# Patient Record
Sex: Female | Born: 1940
Health system: Southern US, Community
[De-identification: ages and names within clinical notes are randomized; demographics above are authoritative.]

## PROBLEM LIST (undated history)

## (undated) DIAGNOSIS — N8110 Cystocele, unspecified: Secondary | ICD-10-CM

## (undated) DIAGNOSIS — K219 Gastro-esophageal reflux disease without esophagitis: Secondary | ICD-10-CM

## (undated) DIAGNOSIS — M1990 Unspecified osteoarthritis, unspecified site: Secondary | ICD-10-CM

## (undated) DIAGNOSIS — E042 Nontoxic multinodular goiter: Secondary | ICD-10-CM

## (undated) DIAGNOSIS — K623 Rectal prolapse: Secondary | ICD-10-CM

## (undated) DIAGNOSIS — C50919 Malignant neoplasm of unspecified site of unspecified female breast: Secondary | ICD-10-CM

## (undated) DIAGNOSIS — I10 Essential (primary) hypertension: Secondary | ICD-10-CM

## (undated) DIAGNOSIS — E049 Nontoxic goiter, unspecified: Secondary | ICD-10-CM

## (undated) DIAGNOSIS — E785 Hyperlipidemia, unspecified: Secondary | ICD-10-CM

## (undated) HISTORY — DX: Unspecified osteoarthritis, unspecified site: M19.90

## (undated) HISTORY — DX: Cystocele, unspecified: N81.10

## (undated) HISTORY — DX: Nontoxic goiter, unspecified: E04.9

## (undated) HISTORY — DX: Essential (primary) hypertension: I10

## (undated) HISTORY — DX: Nontoxic multinodular goiter: E04.2

## (undated) HISTORY — PX: CHOLECYSTECTOMY: SHX55

## (undated) HISTORY — PX: ABDOMINAL HYSTERECTOMY: SHX81

## (undated) HISTORY — PX: COLONOSCOPY: SHX5424

## (undated) HISTORY — DX: Rectal prolapse: K62.3

## (undated) HISTORY — DX: Malignant neoplasm of unspecified site of unspecified female breast: C50.919

## (undated) HISTORY — DX: Hyperlipidemia, unspecified: E78.5

---

## 2008-04-23 HISTORY — PX: BREAST LUMPECTOMY: SHX2

## 2009-11-01 ENCOUNTER — Encounter: Admission: RE | Admit: 2009-11-01 | Discharge: 2009-11-01 | Payer: Self-pay | Admitting: Radiology

## 2009-11-16 ENCOUNTER — Ambulatory Visit (HOSPITAL_COMMUNITY): Admission: RE | Admit: 2009-11-16 | Discharge: 2009-11-16 | Payer: Self-pay | Admitting: General Surgery

## 2009-12-13 ENCOUNTER — Ambulatory Visit: Admission: RE | Admit: 2009-12-13 | Discharge: 2010-01-24 | Payer: Self-pay | Admitting: Radiation Oncology

## 2010-07-05 ENCOUNTER — Encounter: Payer: Self-pay | Admitting: Cardiology

## 2010-07-08 LAB — CBC
HCT: 44.6 % (ref 36.0–46.0)
MCH: 31.3 pg (ref 26.0–34.0)
MCHC: 33.7 g/dL (ref 30.0–36.0)
MCV: 92.7 fL (ref 78.0–100.0)

## 2010-07-08 LAB — DIFFERENTIAL
Basophils Absolute: 0 10*3/uL (ref 0.0–0.1)
Basophils Relative: 1 % (ref 0–1)
Eosinophils Absolute: 0.3 10*3/uL (ref 0.0–0.7)
Eosinophils Relative: 4 % (ref 0–5)
Lymphocytes Relative: 25 % (ref 12–46)
Lymphs Abs: 2.1 10*3/uL (ref 0.7–4.0)
Monocytes Relative: 10 % (ref 3–12)

## 2010-07-08 LAB — COMPREHENSIVE METABOLIC PANEL
Alkaline Phosphatase: 86 U/L (ref 39–117)
BUN: 15 mg/dL (ref 6–23)
Calcium: 9.4 mg/dL (ref 8.4–10.5)
Chloride: 103 mEq/L (ref 96–112)
Creatinine, Ser: 0.72 mg/dL (ref 0.4–1.2)
GFR calc Af Amer: >60 mL/min (ref >60)
GFR calc non Af Amer: >60 mL/min (ref >60)
Potassium: 3.8 mEq/L (ref 3.5–5.1)
Sodium: 138 mEq/L (ref 135–145)

## 2010-07-08 LAB — URINALYSIS, ROUTINE W REFLEX MICROSCOPIC
Ketones, ur: NEGATIVE mg/dL
Nitrite: NEGATIVE
Specific Gravity, Urine: 1.019 (ref 1.005–1.030)
Urobilinogen, UA: 0.2 mg/dL (ref 0.0–1.0)
pH: 6.5 (ref 5.0–8.0)

## 2010-07-08 LAB — SURGICAL PCR SCREEN
MRSA, PCR: NEGATIVE
Staphylococcus aureus: NEGATIVE

## 2010-07-20 NOTE — Letter (Signed)
Summary: External Correspondence/  [facility] MEDICINE  External Correspondence/  [facility] MEDICINE   Imported By: Dorise Hiss 07/10/2010 16:33:19  _____________________________________________________________________  External Attachment:    Type:   Image     Comment:   External Document

## 2010-08-18 ENCOUNTER — Encounter: Payer: Self-pay | Admitting: Cardiology

## 2010-08-21 ENCOUNTER — Encounter: Payer: Self-pay | Admitting: *Deleted

## 2010-08-21 ENCOUNTER — Encounter: Payer: Self-pay | Admitting: Cardiology

## 2010-08-21 ENCOUNTER — Ambulatory Visit (INDEPENDENT_AMBULATORY_CARE_PROVIDER_SITE_OTHER): Payer: Medicare Other | Admitting: Cardiology

## 2010-08-21 VITALS — BP 148/86 | HR 66 | Ht 64.0 in | Wt 186.0 lb

## 2010-08-21 DIAGNOSIS — E785 Hyperlipidemia, unspecified: Secondary | ICD-10-CM

## 2010-08-21 DIAGNOSIS — R072 Precordial pain: Secondary | ICD-10-CM | POA: Insufficient documentation

## 2010-08-21 DIAGNOSIS — I10 Essential (primary) hypertension: Secondary | ICD-10-CM | POA: Insufficient documentation

## 2010-08-21 NOTE — Assessment & Plan Note (Signed)
The blood pressure is at target. No change in medications is indicated. We will continue with therapeutic lifestyle changes (TLC).  

## 2010-08-21 NOTE — Assessment & Plan Note (Signed)
Her chest discomfort is somewhat atypical. However, she has significant cardiovascular risk factors. Exercise treadmill testing as indicated. We discussed at length risk reduction.

## 2010-08-21 NOTE — Progress Notes (Signed)
Belva Agee, NP  HPI Patient presents for evaluation of chest discomfort. She has had no prior cardiac history other than a stress test many years ago. However, she has a family history of early coronary disease. She noted about 6 weeks ago chest discomfort. This happened was movements were then progressed to be some heaviness when she went walking. She walks every day. She noticed some decreased exercise tolerance. She had some mild discomfort with nausea. She did have an EKG which I reviewed which showed no significant abnormalities. She said that actually over the past couple of weeks her symptoms have abated. She has had no jaw discomfort. She has had no shortness of breath, PND or orthopnea. She has had no palpitations, presyncope or syncope.  No Known Allergies  Current Outpatient Prescriptions  Medication Sig Dispense Refill  . anastrozole (ARIMIDEX) 1 MG tablet Take 1 mg by mouth daily.        [name] aspirin 81 MG tablet Take 81 mg by mouth daily.        . Calcium Carbonate-Vitamin D (CALCIUM 600+D) 600-200 MG-UNIT TABS Take 1 tablet by mouth daily.        [name] diltiazem (CARDIZEM CD) 300 MG 24 hr capsule Take 300 mg by mouth daily.        . Multiple Vitamin (MULTIVITAMIN) tablet Take 1 tablet by mouth daily.        [name] olmesartan-hydrochlorothiazide (BENICAR HCT) 40-12.5 MG per tablet Take 1 tablet by mouth daily.        . simvastatin (ZOCOR) 20 MG tablet Take 10 mg by mouth at bedtime.          Past Medical History  Diagnosis Date  . Unspecified essential hypertension   . Precordial pain   . Other and unspecified hyperlipidemia   . Malignant neoplasm of breast (female), unspecified site     Past Surgical History  Procedure Date  . Breast lumpectomy   . Cholecystectomy     Family History  Problem Relation Age of Onset  . Coronary artery disease Mother      CABG age 58  . Alcohol abuse Father     History   Social History  . Marital Status: Married    Spouse Name: N/A   Number of Children: 2  . Years of Education: N/A   Occupational History  . Retired     Futures trader   Social History Main Topics  . Smoking status: Never Smoker   . Smokeless tobacco: Not on file  . Alcohol Use: Not on file  . Drug Use: Not on file  . Sexually Active: Not on file   Other Topics Concern  . Not on file   Social History Narrative  . No narrative on file    ROS:  Positive for mild bladder and rectal prolapse, soft fleshy loose, sciatic nerve problems. Otherwise as stated in the history of present illness negative for all other systems.  PHYSICAL EXAM BP 148/86  Pulse 66  Ht 5\' 4"  (1.626 m)  Wt 186 lb (84.369 kg)  BMI 31.93 kg/m2 GENERAL:  Well appearing HEENT:  Pupils equal round and reactive, fundi not visualized, oral mucosa unremarkable NECK:  No jugular venous distention, waveform within normal limits, carotid upstroke brisk and symmetric, no bruits, no thyromegaly LYMPHATICS:  No cervical, inguinal adenopathy LUNGS:  Clear to auscultation bilaterally BACK:  No CVA tenderness CHEST:  Unremarkable HEART:  PMI not displaced or sustained,S1 and S2 within normal limits, no S3, no  S4, no clicks, no rubs, no murmurs ABD:  Flat, positive bowel sounds normal in frequency in pitch, no bruits, no rebound, no guarding, no midline pulsatile mass, no hepatomegaly, no splenomegaly EXT:  2 plus pulses throughout, no edema, no cyanosis no clubbing SKIN:  No rashes no nodules NEURO:  Cranial nerves II through XII grossly intact, motor grossly intact throughout PSYCH:  Cognitively intact, oriented to person place and time   EKG:  Reviewed primary M.D. Office, April 14, normal sinus rhythm, axis within normal limits, no acute ST-T wave changes  ASSESSMENT AND PLAN

## 2010-08-21 NOTE — Assessment & Plan Note (Signed)
She reports that this is well controlled.  I will defer to her primary provider.

## 2010-08-21 NOTE — Patient Instructions (Signed)
Your physician has requested that you have an exercise tolerance test. For further information please visit [email or web address] Please also follow instruction sheet, as given. Your physician recommends that you continue on your current medications as directed. Please refer to the Current Medication list given to you today. Follow up will be based on test results. If the results of your test are normal or stable, you will receive a letter. If they are abnormal, the nurse will contact you by phone.

## 2010-08-23 DIAGNOSIS — R072 Precordial pain: Secondary | ICD-10-CM

## 2010-08-29 ENCOUNTER — Telehealth: Payer: Self-pay | Admitting: *Deleted

## 2010-08-29 NOTE — Telephone Encounter (Signed)
Pt notified of results and verbalized understanding.  Report faxed to [facility].

## 2010-08-29 NOTE — Telephone Encounter (Signed)
Message copied by Cyril Loosen on Tue Aug 29, 2010  1:29 PM ------      Message from: Rollene Rotunda      Created: Sat Aug 26, 2010  7:07 PM       No ischemia.  However, she needs to continue to have her blood pressure followed by [facility].  Fax results to that office.

## 2010-09-22 ENCOUNTER — Encounter (INDEPENDENT_AMBULATORY_CARE_PROVIDER_SITE_OTHER): Payer: Self-pay | Admitting: General Surgery

## 2010-09-22 DIAGNOSIS — Z780 Asymptomatic menopausal state: Secondary | ICD-10-CM | POA: Insufficient documentation

## 2010-09-22 DIAGNOSIS — Z79890 Hormone replacement therapy (postmenopausal): Secondary | ICD-10-CM

## 2010-09-22 DIAGNOSIS — M1990 Unspecified osteoarthritis, unspecified site: Secondary | ICD-10-CM | POA: Insufficient documentation

## 2010-09-22 HISTORY — DX: Asymptomatic menopausal state: Z78.0

## 2010-11-27 ENCOUNTER — Ambulatory Visit (INDEPENDENT_AMBULATORY_CARE_PROVIDER_SITE_OTHER): Payer: Self-pay | Admitting: General Surgery

## 2010-12-04 ENCOUNTER — Encounter (INDEPENDENT_AMBULATORY_CARE_PROVIDER_SITE_OTHER): Payer: Self-pay

## 2010-12-04 ENCOUNTER — Encounter (INDEPENDENT_AMBULATORY_CARE_PROVIDER_SITE_OTHER): Payer: Self-pay | Admitting: General Surgery

## 2010-12-04 ENCOUNTER — Ambulatory Visit (INDEPENDENT_AMBULATORY_CARE_PROVIDER_SITE_OTHER): Payer: Medicare Other | Admitting: General Surgery

## 2010-12-04 VITALS — BP 118/78 | HR 64 | Temp 97.6°F | Wt 186.2 lb

## 2010-12-04 DIAGNOSIS — Z853 Personal history of malignant neoplasm of breast: Secondary | ICD-10-CM

## 2010-12-04 NOTE — Patient Instructions (Signed)
Your physical exam today is normal. All of your wounds have healed well. There is no sign of any recurrent cancer. Your mammograms which were performed on Sep 20, 2010 look good. There is a lot of scarring where the lumpectomy and radiation therapy was. I agree that you should get a right breast mammogram in 6 months and bilateral mammograms in one year. I will see you back in one year, sooner if there are any new problems.

## 2010-12-04 NOTE — Progress Notes (Signed)
Ashley Holland is a 70 y.o. female.    Chief Complaint  Patient presents with  . Breast Cancer Long Term Follow Up    HPI HPI  Patient returns for long-term followup for her breast cancer.  She underwent right partial mastectomy with sentinel lymph biopsy and reexcision of superior margin on November 16, 2009. She had an invasive cancer stage TI C., N0, receptor positive, Ki-67 18%, HER-2-negative. The tumor was 1.3 cm and she had good negative margins.  She completed radiation therapy on January 19, 2010. She is taking arimidex and is following regularly with Dr. Earma Reading.  She feels fine. She has no complaints. She has minimal hot flashes from the anti-estrogen therapy. She says her breasts feel fine.  She had bilateral mammograms on Sep 20, 2010. No significant abnormalities identified but minor postlumpectomy scarring changes were seen on the right right as well some skin thickening and radiation therapy. They plan a 6 month interval followup mammogram of the right breast in November.  Past Medical History  Diagnosis Date  . Unspecified essential hypertension   . Precordial pain   . Other and unspecified hyperlipidemia   . Malignant neoplasm of breast (female), unspecified site   . Arthritis     Per medical history form dated 11/04/09.  [name] Rectal prolapse   . Female bladder prolapse     Past Surgical History  Procedure Date  . Breast lumpectomy     Right  . Cholecystectomy   . Abdominal hysterectomy     Family History  Problem Relation Age of Onset  . Coronary artery disease Mother      CABG age 82  . Hyperlipidemia Mother   . Stroke Mother   . Alcohol abuse Father   . Hypertension Father   . Hypertension Sister     Social History History  Substance Use Topics  . Smoking status: Never Smoker   . Smokeless tobacco: Not on file  . Alcohol Use: No    No Known Allergies  Current Outpatient Prescriptions  Medication Sig Dispense Refill  . anastrozole  (ARIMIDEX) 1 MG tablet Take 1 mg by mouth daily.        [name] aspirin 81 MG tablet Take 81 mg by mouth daily.        . Calcium Carbonate-Vitamin D (CALCIUM 600+D) 600-200 MG-UNIT TABS Take 1 tablet by mouth daily.        [name] diltiazem (CARDIZEM CD) 300 MG 24 hr capsule Take 300 mg by mouth daily.        . Multiple Vitamin (MULTIVITAMIN) tablet Take 1 tablet by mouth daily.        [name] olmesartan-hydrochlorothiazide (BENICAR HCT) 40-12.5 MG per tablet Take 1 tablet by mouth daily.        . simvastatin (ZOCOR) 20 MG tablet Take 10 mg by mouth at bedtime.        [name] estradiol (ESTRACE) 1 MG tablet Take 1 mg by mouth daily. Per medical history form dated 11/04/09.       . naproxen sodium (ANAPROX) 220 MG tablet Take 220 mg by mouth 1 dose over 24 hours. Taken in pm. per medical history form dated 11/04/09.         Review of Systems ROS 10 system review of systems is performed and is negative except as described above. Physical Exam Physical Exam  Patient looks well, is in good spirits, and is in no distress.  Neck- no adenopathy, no mass, no jugular venous distention.  Lungs- are clear to auscultation. No chest wall tenderness.  Breasts- left breast is soft no palpable mass no skin changes no axillary adenopathy. Right breast reveals a well-healed scar laterally and well-healed axillary scar. No palpable mass no other skin changes no maxillary adenopathy. Extremities- right arm feels normal. There is no sensory deficit.here is no swelling. Blood pressure 118/78, pulse 64, temperature 97.6 F (36.4 C), temperature source Temporal, weight 186 lb 3.2 oz (84.46 kg).  Assessment/Plan Invasive mammary carcinoma of the right breast, upper outer quadrant, pathologic stage TI C., N0, receptor positive, Ki-67 18%.HER-2 negative.  No evidence of recurrence one year postop.  I agree with the right breast mammogram in 6 months, and bilateral mammograms in one year.  Continue close followup as Dr. Earma Reading.  Return to see me in one year. Reeve Mallo M 12/04/2010, 12:30 PM

## 2010-12-23 HISTORY — PX: UPPER GASTROINTESTINAL ENDOSCOPY: SHX188

## 2011-01-19 ENCOUNTER — Other Ambulatory Visit: Payer: Self-pay | Admitting: Gastroenterology

## 2011-03-20 ENCOUNTER — Encounter: Payer: Self-pay | Admitting: Radiation Oncology

## 2011-03-23 ENCOUNTER — Other Ambulatory Visit (HOSPITAL_COMMUNITY): Payer: Self-pay | Admitting: Gastroenterology

## 2011-03-23 ENCOUNTER — Other Ambulatory Visit: Payer: Self-pay | Admitting: Gastroenterology

## 2011-03-23 DIAGNOSIS — R112 Nausea with vomiting, unspecified: Secondary | ICD-10-CM

## 2011-03-23 DIAGNOSIS — R634 Abnormal weight loss: Secondary | ICD-10-CM

## 2011-03-29 ENCOUNTER — Ambulatory Visit
Admission: RE | Admit: 2011-03-29 | Discharge: 2011-03-29 | Disposition: A | Discharge status: Home / Self Care | Payer: Medicare Other | Source: Ambulatory Visit | Attending: Gastroenterology | Admitting: Gastroenterology

## 2011-03-29 DIAGNOSIS — R634 Abnormal weight loss: Secondary | ICD-10-CM

## 2011-03-29 MED ORDER — IOHEXOL 300 MG/ML  SOLN
100.0000 mL | Freq: Once | INTRAMUSCULAR | Status: AC | PRN
  Administered 2011-03-29: 100 mL via INTRAVENOUS

## 2011-04-02 ENCOUNTER — Encounter (HOSPITAL_COMMUNITY)
Admission: RE | Admit: 2011-04-02 | Discharge: 2011-04-02 | Disposition: A | Discharge status: Home / Self Care | Payer: Medicare Other | Source: Ambulatory Visit | Attending: Gastroenterology | Admitting: Gastroenterology

## 2011-04-02 DIAGNOSIS — R112 Nausea with vomiting, unspecified: Secondary | ICD-10-CM | POA: Insufficient documentation

## 2011-04-02 MED ORDER — TECHNETIUM TC 99M SULFUR COLLOID
2.0000 | Freq: Once | INTRAVENOUS | Status: AC | PRN
  Administered 2011-04-02: 2 via INTRAVENOUS

## 2011-04-10 ENCOUNTER — Encounter (HOSPITAL_COMMUNITY): Payer: Self-pay | Admitting: *Deleted

## 2011-04-25 ENCOUNTER — Encounter (HOSPITAL_COMMUNITY)
Admission: RE | Disposition: A | Discharge status: Home / Self Care | Payer: Self-pay | Source: Ambulatory Visit | Attending: Gastroenterology

## 2011-04-25 ENCOUNTER — Encounter (HOSPITAL_COMMUNITY): Payer: Self-pay | Admitting: *Deleted

## 2011-04-25 ENCOUNTER — Ambulatory Visit (HOSPITAL_COMMUNITY): Payer: Medicare Other | Admitting: Anesthesiology

## 2011-04-25 ENCOUNTER — Ambulatory Visit (HOSPITAL_COMMUNITY)
Admission: RE | Admit: 2011-04-25 | Discharge: 2011-04-25 | Disposition: A | Discharge status: Home / Self Care | Payer: Medicare Other | Source: Ambulatory Visit | Attending: Gastroenterology | Admitting: Gastroenterology

## 2011-04-25 ENCOUNTER — Encounter (HOSPITAL_COMMUNITY): Payer: Self-pay | Admitting: Anesthesiology

## 2011-04-25 DIAGNOSIS — Z9089 Acquired absence of other organs: Secondary | ICD-10-CM | POA: Insufficient documentation

## 2011-04-25 DIAGNOSIS — K828 Other specified diseases of gallbladder: Secondary | ICD-10-CM | POA: Insufficient documentation

## 2011-04-25 DIAGNOSIS — E785 Hyperlipidemia, unspecified: Secondary | ICD-10-CM | POA: Insufficient documentation

## 2011-04-25 DIAGNOSIS — Z853 Personal history of malignant neoplasm of breast: Secondary | ICD-10-CM | POA: Insufficient documentation

## 2011-04-25 DIAGNOSIS — I10 Essential (primary) hypertension: Secondary | ICD-10-CM | POA: Insufficient documentation

## 2011-04-25 DIAGNOSIS — R112 Nausea with vomiting, unspecified: Secondary | ICD-10-CM | POA: Insufficient documentation

## 2011-04-25 DIAGNOSIS — K838 Other specified diseases of biliary tract: Secondary | ICD-10-CM | POA: Insufficient documentation

## 2011-04-25 HISTORY — PX: EUS: SHX5427

## 2011-04-25 LAB — BASIC METABOLIC PANEL
CO2: 25 mEq/L (ref 19–32)
Calcium: 9.5 mg/dL (ref 8.4–10.5)
Chloride: 103 mEq/L (ref 96–112)
Creatinine, Ser: 0.65 mg/dL (ref 0.50–1.10)
GFR calc non Af Amer: 87 mL/min — ABNORMAL LOW (ref >90)
Glucose, Bld: 102 mg/dL — ABNORMAL HIGH (ref 70–99)
Sodium: 137 mEq/L (ref 135–145)

## 2011-04-25 SURGERY — UPPER ENDOSCOPIC ULTRASOUND (EUS) RADIAL
Anesthesia: Monitor Anesthesia Care

## 2011-04-25 MED ORDER — KETAMINE HCL 10 MG/ML IJ SOLN
INTRAMUSCULAR | Status: DC | PRN
  Administered 2011-04-25 (×2): 10 mg via INTRAVENOUS

## 2011-04-25 MED ORDER — FENTANYL CITRATE 0.05 MG/ML IJ SOLN
INTRAMUSCULAR | Status: DC | PRN
  Administered 2011-04-25 (×2): 50 ug via INTRAVENOUS

## 2011-04-25 MED ORDER — BUTAMBEN-TETRACAINE-BENZOCAINE 2-2-14 % EX AERO
INHALATION_SPRAY | CUTANEOUS | Status: DC | PRN
  Administered 2011-04-25: 2 via TOPICAL

## 2011-04-25 MED ORDER — MIDAZOLAM HCL 5 MG/5ML IJ SOLN
INTRAMUSCULAR | Status: DC | PRN
  Administered 2011-04-25 (×2): 1 mg via INTRAVENOUS

## 2011-04-25 MED ORDER — PROPOFOL 10 MG/ML IV EMUL
INTRAVENOUS | Status: DC | PRN
  Administered 2011-04-25: 100 ug/kg/min via INTRAVENOUS

## 2011-04-25 MED ORDER — SODIUM CHLORIDE 0.9 % IV SOLN
Freq: Once | INTRAVENOUS | Status: AC
  Administered 2011-04-25: 11:00:00 via INTRAVENOUS
  Administered 2011-04-25: 1000 mL via INTRAVENOUS

## 2011-04-25 NOTE — H&P (Signed)
CC:  N/V  HPI:  71 yo female with recurrent N/V.  Dilated CBD seen on CT scan.  EGD normal.  Recent LFTs normal.  PMHx:  Hyperlipidemia, HTN, breast cancer, DJC, cholecystectomy 1968  MEDS:  Zocor, diltiazem, Benicar, Anastrozole, ASA, Calcium, MVI  All:  NKDA  SocHx:  Rare etoh, retired, no smoking  FHx:  No FHx CRC, polyp, pancreatic cancer  ROS:  As per HPI, otherwise negative.  PE: GEN:  Younger appearing than stated age, NAD HEENT:  Anicteric Lungs:  CTA Heart:  RRR ABD:  Soft NEURO:  Non-focal  CT:  Dilated IH/EH bile duct  A/P  Biliary dilatation, normal LFTs         N/V.  Normal endoscopy.  Plan for EUS +/- FNA  Risks (bleeding, infection, bowel perforation that could require surgery, sedation-related changes in cardiopulmonary systems), benefits (identification and possible treatment of source of symptoms, exclusion of certain causes of symptoms), and alternatives (watchful waiting, radiographic imaging studies, empiric medical treatment) of upper endoscopy (EGD) were explained to patient/family in detail and patient wishes to proceed.

## 2011-04-25 NOTE — Brief Op Note (Signed)
Please see [facility] note dated 04/25/11. 

## 2011-04-25 NOTE — Transfer of Care (Signed)
Immediate Anesthesia Transfer of Care Note  Patient: Ashley Holland  Procedure(s) Performed:  UPPER ENDOSCOPIC ULTRASOUND (EUS) RADIAL  Patient Location: Endoscopy Unit  Anesthesia Type: MAC  Level of Consciousness: awake, sedated and patient cooperative  Airway & Oxygen Therapy: Patient Spontanous Breathing and Patient connected to nasal cannula oxygen  Post-op Assessment: Report given to PACU RN and Post -op Vital signs reviewed and stable  Post vital signs: Reviewed and stable  Complications: No apparent anesthesia complications

## 2011-04-25 NOTE — Anesthesia Postprocedure Evaluation (Signed)
  Anesthesia Post-op Note  Patient: Ashley Holland  Procedure(s) Performed:  UPPER ENDOSCOPIC ULTRASOUND (EUS) RADIAL  Patient Location: PACU  Anesthesia Type: MAC  Level of Consciousness: oriented and sedated  Airway and Oxygen Therapy: Patient Spontanous Breathing  Post-op Pain: none  Post-op Assessment: Post-op Vital signs reviewed, Patient's Cardiovascular Status Stable, Respiratory Function Stable and Patent Airway  Post-op Vital Signs: stable  Complications: No apparent anesthesia complications

## 2011-04-25 NOTE — Anesthesia Preprocedure Evaluation (Signed)
Anesthesia Evaluation  Patient identified by MRN, date of birth, ID band Patient awake    Reviewed: Allergy & Precautions, H&P , NPO status , Patient's Chart, lab work & pertinent test results, reviewed documented beta blocker date and time   Airway Mallampati: I TM Distance: >3 FB Neck ROM: Full    Dental  (+) Teeth Intact   Pulmonary neg pulmonary ROS,  clear to auscultation        Cardiovascular hypertension, Pt. on medications Regular Normal Cardiac Symptoms   Neuro/Psych Negative Neurological ROS  Negative Psych ROS   GI/Hepatic Neg liver ROS, N&V   Endo/Other  Negative Endocrine ROS  Renal/GU negative Renal ROS  Genitourinary negative   Musculoskeletal negative musculoskeletal ROS (+)   Abdominal   Peds negative pediatric ROS (+)  Hematology negative hematology ROS (+)   Anesthesia Other Findings   Reproductive/Obstetrics negative OB ROS                           Anesthesia Physical Anesthesia Plan  ASA: II  Anesthesia Plan: MAC   Post-op Pain Management:    Induction: Intravenous  Airway Management Planned: Mask  Additional Equipment:   Intra-op Plan:   Post-operative Plan:   Informed Consent: I have reviewed the patients History and Physical, chart, labs and discussed the procedure including the risks, benefits and alternatives for the proposed anesthesia with the patient or authorized representative who has indicated his/her understanding and acceptance.     Plan Discussed with: CRNA and Surgeon  Anesthesia Plan Comments:         Anesthesia Quick Evaluation

## 2011-04-25 NOTE — Op Note (Signed)
[facility] [address] [place], [place]  [address]  ENDOSCOPIC ULTRASOUND PROCEDURE REPORT  PATIENT:  Ashley Holland, Ashley Holland  MR#:  [id number] BIRTHDATE:  07-Aug-1940  GENDER:  female  ENDOSCOPIST:  Willis Modena, MD REFERRED BY:  Leodis Sias, M.D. Wandalee Ferdinand, M.D.  PROCEDURE DATE:  04/25/2011 PROCEDURE:  Upper EUS ASA CLASS:  Class II INDICATIONS:  nausea, vomiting, dilated CBD (LFTs normal)  MEDICATIONS:   MAC sedation, administered by CRNA, Cetacaine spray x 2  DESCRIPTION OF PROCEDURE:   After the risks benefits and alternatives of the procedure were  explained, informed consent was obtained. The patient was then placed in the left, lateral, decubitus postion and IV sedation was administered. Throughout the procedure, the patient's blood pressure, pulse and oxygen saturations were monitored continuously.  Under direct visualization, the 110036 endoscope was introduced through the mouth and advanced to the second portion of the duodenum.  Water was used as necessary to provide an acoustic interface.  Upon completion of the imaging, water was removed and the patient was sent to the recovery room in satisfactory  condition.  <<PROCEDUREIMAGES>>  FINDINGS:  Normal-appearing pancreas without mass or chronic pancreatitis. Ampulla normal endoscopically and via EUS.  CBD  10mm, CHD    13mm.  No bile duct wall thickening or choledocholithiasis.  Post-cholecystectomy.  ENDOSCOPIC IMPRESSION:    1.  Dilated CBD and CHD without obstructing lesion seen.  Anatomic variant most likely, perhaps exacerbated post-cholecystectomy.  Choledochocele other less likely consideration. 2.  Normal-appearing pancreas; no mass seen. 3.  Normal-appearing ampulla, both endoscopically and via EUS.  RECOMMENDATIONS:      1.  Watch for potential complications of procedure. 2.  Given patient's age, and today's findings, watchful waiting is likely best course of action. 3.  Will discuss  with Dr. Evette Cristal.  ______________________________ Willis Modena  CC:  n. eSIGNEDWillis Modena at 04/25/2011 11:11 AM  [name], [id number]

## 2011-04-26 ENCOUNTER — Encounter (HOSPITAL_COMMUNITY): Payer: Self-pay | Admitting: Gastroenterology

## 2011-10-03 ENCOUNTER — Encounter (INDEPENDENT_AMBULATORY_CARE_PROVIDER_SITE_OTHER): Payer: Self-pay | Admitting: General Surgery

## 2011-12-10 ENCOUNTER — Encounter (INDEPENDENT_AMBULATORY_CARE_PROVIDER_SITE_OTHER): Payer: Self-pay | Admitting: General Surgery

## 2011-12-10 ENCOUNTER — Ambulatory Visit (INDEPENDENT_AMBULATORY_CARE_PROVIDER_SITE_OTHER): Payer: Medicare Other | Admitting: General Surgery

## 2011-12-10 VITALS — BP 116/62 | HR 60 | Temp 97.5°F | Resp 16 | Ht 65.5 in | Wt 175.8 lb

## 2011-12-10 DIAGNOSIS — C50919 Malignant neoplasm of unspecified site of unspecified female breast: Secondary | ICD-10-CM

## 2011-12-10 DIAGNOSIS — C50911 Malignant neoplasm of unspecified site of right female breast: Secondary | ICD-10-CM | POA: Insufficient documentation

## 2011-12-10 HISTORY — DX: Malignant neoplasm of unspecified site of right female breast: C50.911

## 2011-12-10 NOTE — Patient Instructions (Signed)
Your breast exam today is completely normal. There is no evidence of breast cancer. Your mammograms from May are also normal.  Be sure to keep your appointments with Dr. Ubaldo Glassing in [place].  Mammograms next May.  Return to see Dr. Derrell Lolling in one year

## 2011-12-10 NOTE — Progress Notes (Signed)
Patient ID: Ashley Holland, female   DOB: 02-02-1941, 71 y.o.   MRN: [id number]  Chief Complaint  Patient presents with  . Breast Cancer Long Term Follow Up    HPI Ashley Holland is a 71 y.o. female.  She returns for long-term followup of her right breast cancer.  On 11/16/2009 this patient underwent right partial mastectomy and sentinel node biopsy and reexcision of superior margins. Pathology showed invasive ductal carcinoma, receptor positive, HER-2 negative, proliferation index 18%, 1.8 cm tumor, negative margins, stage T1c., N0. She completed radiation therapy under the supervision of Dr. Lurline Hare.  She states that her breast feels fine. Her arms feel fine. She has no complaints.  Bilateral mammograms performed at [facility] on 09/21/2011 looked fine. Category 2.  She is on arimidex and  tolerating that well. HPI  Past Medical History  Diagnosis Date  . Unspecified essential hypertension   . Precordial pain   . Other and unspecified hyperlipidemia   . Arthritis     Per medical history form dated 11/04/09.  [name] Rectal prolapse   . Female bladder prolapse   . Malignant neoplasm of breast (female), unspecified site     Past Surgical History  Procedure Date  . Abdominal hysterectomy   . Cholecystectomy   . Colonoscopy   . Djd   . Breast lumpectomy 2010    Right  . Upper gastrointestinal endoscopy Sept 2012  . Eus 04/25/2011    Procedure: UPPER ENDOSCOPIC ULTRASOUND (EUS) RADIAL;  Surgeon: Freddy Jaksch, MD;  Location: [facility] ENDOSCOPY;  Service: Endoscopy;  Laterality: N/A;    Family History  Problem Relation Age of Onset  . Coronary artery disease Mother      CABG age 77  . Hyperlipidemia Mother   . Stroke Mother   . Alcohol abuse Father   . Hypertension Father   . Hypertension Sister   . Anesthesia problems Neg Hx   . Hypotension Neg Hx   . Malignant hyperthermia Neg Hx   . Pseudochol deficiency Neg Hx     Social History History  Substance Use Topics  .  Smoking status: Never Smoker   . Smokeless tobacco: Not on file  . Alcohol Use: No    No Known Allergies  Current Outpatient Prescriptions  Medication Sig Dispense Refill  . anastrozole (ARIMIDEX) 1 MG tablet Take 1 mg by mouth daily.        [name] aspirin 81 MG tablet Take 81 mg by mouth daily.        . Calcium Carbonate-Vitamin D (CALCIUM 600+D) 600-200 MG-UNIT TABS Take 1 tablet by mouth daily.        [name] diltiazem (CARDIZEM CD) 300 MG 24 hr capsule Take 300 mg by mouth daily.        . Multiple Vitamin (MULTIVITAMIN) tablet Take 1 tablet by mouth daily.        . naproxen sodium (ANAPROX) 220 MG tablet Take 220 mg by mouth 1 dose over 24 hours. Taken in pm. per medical history form dated 11/04/09.       [name] olmesartan-hydrochlorothiazide (BENICAR HCT) 40-12.5 MG per tablet Take 1 tablet by mouth daily.        . simvastatin (ZOCOR) 20 MG tablet Take 10 mg by mouth at bedtime.          Review of Systems Review of Systems  Constitutional: Negative for fever, chills and unexpected weight change.  HENT: Negative for hearing loss, congestion, sore throat, trouble swallowing and voice change.  Eyes: Negative for visual disturbance.  Respiratory: Negative for cough and wheezing.   Cardiovascular: Negative for chest pain, palpitations and leg swelling.  Gastrointestinal: Negative for nausea, vomiting, abdominal pain, diarrhea, constipation, blood in stool, abdominal distention and anal bleeding.  Genitourinary: Negative for hematuria, vaginal bleeding and difficulty urinating.  Musculoskeletal: Negative for arthralgias.  Skin: Negative for rash and wound.  Neurological: Negative for seizures, syncope and headaches.  Hematological: Negative for adenopathy. Does not bruise/bleed easily.  Psychiatric/Behavioral: Negative for confusion.    Blood pressure 116/62, pulse 60, temperature 97.5 F (36.4 C), temperature source Temporal, resp. rate 16, height 5' 5.5" (1.664 m), weight 175 lb 12.8 oz (79.742  kg).  Physical Exam Physical Exam  Constitutional: She is oriented to person, place, and time. She appears well-developed and well-nourished. No distress.  HENT:  Head: Normocephalic and atraumatic.  Neck: Neck supple. No JVD present. No tracheal deviation present. No thyromegaly present.  Cardiovascular: Normal rate, regular rhythm, normal heart sounds and intact distal pulses.   No murmur heard. Pulmonary/Chest: Effort normal and breath sounds normal. No respiratory distress. She has no wheezes. She has no rales. She exhibits no tenderness.       Transverse scar right breast at 9:00. Right axillary scar. All scars well healed. No palpable mass in either breast. No other skin change either breast. No axillary adenopathy on either side. Nipple and areolar complexes looked normal.  Musculoskeletal: She exhibits no edema and no tenderness.  Lymphadenopathy:    She has no cervical adenopathy.  Neurological: She is alert and oriented to person, place, and time. She exhibits normal muscle tone. Coordination normal.  Skin: Skin is warm. No rash noted. She is not diaphoretic. No erythema. No pallor.  Psychiatric: She has a normal mood and affect. Her behavior is normal. Judgment and thought content normal.    Data Reviewed Recent mammograms  Assessment    Invasive ductal carcinoma right breast, pathologic stage TI C., N0, receptor positive, HER-2 negative, proliferation index 18%.  No evidence of recurrence 2 years following right partial mastectomy and sentinel node biopsy and adjuvant radiation therapy    Plan    Continued adjuvant arimidex and close clinical followup with Dr. Earma Reading in [place].  Mammograms should be repeated in May 2014.  Return to see me in one year.       Angelia Mould. Derrell Lolling, M.D., [facility] General and Minimally invasive Surgery Breast and Colorectal Surgery Office:   [phone] Pager:   [phone]  12/10/2011, 2:26  PM

## 2012-01-04 ENCOUNTER — Encounter: Payer: Medicare Other | Admitting: Internal Medicine

## 2012-01-04 DIAGNOSIS — Z170 Estrogen receptor positive status [ER+]: Secondary | ICD-10-CM

## 2012-01-04 DIAGNOSIS — M899 Disorder of bone, unspecified: Secondary | ICD-10-CM

## 2012-01-04 DIAGNOSIS — M949 Disorder of cartilage, unspecified: Secondary | ICD-10-CM

## 2012-01-04 DIAGNOSIS — M810 Age-related osteoporosis without current pathological fracture: Secondary | ICD-10-CM

## 2012-01-04 DIAGNOSIS — C50919 Malignant neoplasm of unspecified site of unspecified female breast: Secondary | ICD-10-CM

## 2012-06-10 ENCOUNTER — Encounter (INDEPENDENT_AMBULATORY_CARE_PROVIDER_SITE_OTHER): Payer: Medicare Other | Admitting: Obstetrics & Gynecology

## 2012-06-10 DIAGNOSIS — N993 Prolapse of vaginal vault after hysterectomy: Secondary | ICD-10-CM

## 2012-07-08 ENCOUNTER — Encounter: Payer: Medicare Other | Admitting: Internal Medicine

## 2012-07-08 DIAGNOSIS — C50919 Malignant neoplasm of unspecified site of unspecified female breast: Secondary | ICD-10-CM

## 2012-07-15 ENCOUNTER — Encounter: Payer: Self-pay | Admitting: *Deleted

## 2012-09-18 ENCOUNTER — Encounter: Payer: Self-pay | Admitting: *Deleted

## 2012-10-03 ENCOUNTER — Ambulatory Visit: Payer: Self-pay | Admitting: Family Medicine

## 2012-10-09 ENCOUNTER — Encounter: Payer: Self-pay | Admitting: Family Medicine

## 2012-10-09 ENCOUNTER — Ambulatory Visit (INDEPENDENT_AMBULATORY_CARE_PROVIDER_SITE_OTHER): Payer: Medicare Other | Admitting: Family Medicine

## 2012-10-09 VITALS — BP 125/73 | HR 74 | Temp 97.4°F | Wt 160.8 lb

## 2012-10-09 DIAGNOSIS — I10 Essential (primary) hypertension: Secondary | ICD-10-CM

## 2012-10-09 DIAGNOSIS — E785 Hyperlipidemia, unspecified: Secondary | ICD-10-CM

## 2012-10-09 NOTE — Progress Notes (Signed)
Patient ID: Ashley Holland, female   DOB: [birth year], 72 y.o.   MRN: [id number] SUBJECTIVE: CC: Chief Complaint  Patient presents with  . Follow-up    4 month follow up discuss bp meds had some lower readings     HPI: 1. Patient is here for follow up of hypertension: denies Headache;deniesChest Pain;denies weakness;denies Shortness of Breath or Orthopnea;denies Visual changes;denies palpitations;denies cough;denies pedal edema;denies symptoms of TIA or stroke; admits to Compliance with medications.had to reduce her benicar/hct because of her lower BPs with diet and weight loss and exercise. denies Problems with medications otherwise.  2. Patient is here for follow up of hyperlipidemia:  denies Headache;denies Chest Pain;denies weakness;denies Shortness of Breath and orthopnea;denies Visual changes;denies palpitations;denies cough;denies pedal edema;denies symptoms of TIA or stroke;deniesClaudication symptoms. admits to Compliance with medications;  PMH/PSH: reviewed/updated in Epic  SH/FH: reviewed/updated in Epic  Allergies: reviewed/updated in Epic  Medications: reviewed/updated in Epic  Immunizations: reviewed/updated in Epic  ROS: As above in the HPI. All other systems are stable or negative.  OBJECTIVE: APPEARANCE:  Patient in no acute distress.The patient appeared well nourished and normally developed. Acyanotic. Waist: VITAL SIGNS:BP 125/73  Pulse 74  Temp(Src) 97.4 F (36.3 C) (Oral)  Wt 160 lb 12.8 oz (72.938 kg)  BMI 26.34 kg/m2 WF  SKIN: warm and  Dry without overt rashes, tattoos and scars  HEAD and Neck: without JVD, Head and scalp: normal Eyes:No scleral icterus. Fundi normal, eye movements normal. Ears: Auricle normal, canal normal, Tympanic membranes normal, insufflation normal. Nose: normal Throat: normal Neck & thyroid: normal  CHEST & LUNGS: Chest wall: normal Lungs: Clear  CVS: Reveals the PMI to be normally located. Regular  rhythm, First and Second Heart sounds are normal,  absence of murmurs, rubs or gallops. Peripheral vasculature: Radial pulses: normal Dorsal pedis pulses: normal Posterior pulses: normal  ABDOMEN:  Appearance: normal Benign, no organomegaly, no masses, no Abdominal Aortic enlargement. No Guarding , no rebound. No Bruits. Bowel sounds: normal  RECTAL: N/A GU: N/A  EXTREMETIES: nonedematous. Both Femoral and Pedal pulses are normal.  MUSCULOSKELETAL:  Spine: normal Joints: intact  NEUROLOGIC: oriented to time,place and person; nonfocal. Strength is normal Sensory is normal Reflexes are normal Cranial Nerves are normal.  ASSESSMENT: HTN (hypertension) - Plan: BASIC METABOLIC PANEL WITH GFR  HLD (hyperlipidemia) - Plan: Hepatic function panel, NMR Lipoprofile with Lipids  PLAN:  Orders Placed This Encounter  Procedures  . BASIC METABOLIC PANEL WITH GFR    Standing Status: Future     Number of Occurrences:      Standing Expiration Date: 10/09/2013  . Hepatic function panel    Standing Status: Future     Number of Occurrences:      Standing Expiration Date: 10/09/2013  . NMR Lipoprofile with Lipids    Standing Status: Future     Number of Occurrences:      Standing Expiration Date: 10/09/2013   Results for orders placed during the hospital encounter of 04/25/11  BASIC METABOLIC PANEL      Result Value Range   Sodium 137  135 - 145 mEq/L   Potassium 3.7  3.5 - 5.1 mEq/L   Chloride 103  96 - 112 mEq/L   CO2 25  19 - 32 mEq/L   Glucose, Bld 102 (*) 70 - 99 mg/dL   BUN 18  6 - 23 mg/dL   Creatinine, Ser 5.78  0.50 - 1.10 mg/dL   Calcium 9.5  8.4 - 46.9 mg/dL  GFR calc non Af Amer 87 (*) >90 mL/min   GFR calc Af Amer >90  >90 mL/min   Meds ordered this encounter  Medications  . docusate sodium (COLACE) 100 MG capsule    Sig: Take 100 mg by mouth 2 (two) times daily.  . Probiotic Product (TRUBIOTICS) CAPS    Sig: Take 1 capsule by mouth daily.   D/C the  benicar/hctz  Return in about 2 months (around 12/09/2012) for Recheck medical problems.  Sheralyn Pinegar P. [place] Charon, M.D.

## 2012-10-16 ENCOUNTER — Other Ambulatory Visit: Payer: Medicare Other

## 2012-10-16 DIAGNOSIS — I10 Essential (primary) hypertension: Secondary | ICD-10-CM

## 2012-10-16 DIAGNOSIS — E785 Hyperlipidemia, unspecified: Secondary | ICD-10-CM

## 2012-10-16 LAB — HEPATIC FUNCTION PANEL
ALT: 12 U/L (ref 0–35)
AST: 24 U/L (ref 0–37)
Albumin: 3.8 g/dL (ref 3.5–5.2)
Alkaline Phosphatase: 79 U/L (ref 39–117)
Bilirubin, Direct: 0.1 mg/dL (ref 0.0–0.3)
Indirect Bilirubin: 0.5 mg/dL (ref 0.0–0.9)
Total Bilirubin: 0.6 mg/dL (ref 0.3–1.2)
Total Protein: 5.9 g/dL — ABNORMAL LOW (ref 6.0–8.3)

## 2012-10-16 LAB — BASIC METABOLIC PANEL WITH GFR
BUN: 15 mg/dL (ref 6–23)
CO2: 27 mEq/L (ref 19–32)
Calcium: 9.1 mg/dL (ref 8.4–10.5)
Chloride: 105 mEq/L (ref 96–112)
Creat: 0.73 mg/dL (ref 0.50–1.10)
GFR, Est African American: >89 mL/min
GFR, Est Non African American: 83 mL/min
Glucose, Bld: 82 mg/dL (ref 70–99)
Potassium: 4.1 mEq/L (ref 3.5–5.3)
Sodium: 137 mEq/L (ref 135–145)

## 2012-10-16 NOTE — Progress Notes (Unsigned)
Patient came in for labs only.

## 2012-10-17 LAB — NMR LIPOPROFILE WITH LIPIDS
Cholesterol, Total: 132 mg/dL (ref <200)
HDL Particle Number: 28.3 umol/L — ABNORMAL LOW (ref >=30.5)
HDL Size: 9.9 nm (ref >=9.2)
HDL-C: 50 mg/dL (ref >=40)
LDL (calc): 71 mg/dL (ref <100)
LDL Particle Number: 869 nmol/L (ref <1000)
LDL Size: 20.5 nm — ABNORMAL LOW (ref >20.5)
LP-IR Score: 26 (ref <=45)
Large HDL-P: 11.1 umol/L (ref >=4.8)
Large VLDL-P: 1.5 nmol/L (ref <=2.7)
Small LDL Particle Number: 397 nmol/L (ref <=527)
Triglycerides: 54 mg/dL (ref <150)
VLDL Size: 46.8 nm — ABNORMAL HIGH (ref <=46.6)

## 2012-11-06 ENCOUNTER — Ambulatory Visit (INDEPENDENT_AMBULATORY_CARE_PROVIDER_SITE_OTHER): Payer: Medicare Other | Admitting: Nurse Practitioner

## 2012-11-06 ENCOUNTER — Encounter: Payer: Self-pay | Admitting: Nurse Practitioner

## 2012-11-06 VITALS — BP 130/72 | HR 65 | Temp 97.1°F | Ht 65.5 in | Wt 162.0 lb

## 2012-11-06 DIAGNOSIS — J069 Acute upper respiratory infection, unspecified: Secondary | ICD-10-CM

## 2012-11-06 DIAGNOSIS — J029 Acute pharyngitis, unspecified: Secondary | ICD-10-CM

## 2012-11-06 LAB — POCT RAPID STREP A (OFFICE): Rapid Strep A Screen: POSITIVE — AB

## 2012-11-06 MED ORDER — CHLORPHEN-PE-ACETAMINOPHEN 4-10-325 MG PO TABS: 1.0000 | ORAL_TABLET | Freq: Every day | ORAL | Status: DC

## 2012-11-06 MED ORDER — AMOXICILLIN 875 MG PO TABS: 875.0000 mg | ORAL_TABLET | Freq: Two times a day (BID) | ORAL | Status: DC

## 2012-11-06 NOTE — Patient Instructions (Signed)
Strep Throat  Strep throat is an infection of the throat caused by a bacteria named Streptococcus pyogenes. Your caregiver may call the infection streptococcal "tonsillitis" or "pharyngitis" depending on whether there are signs of inflammation in the tonsils or back of the throat. Strep throat is most common in children aged 73 15 years during the cold months of the year, but it can occur in people of any age during any season. This infection is spread from person to person (contagious) through coughing, sneezing, or other close contact.  SYMPTOMS   · Fever or chills.  · Painful, swollen, red tonsils or throat.  · Pain or difficulty when swallowing.  · White or yellow spots on the tonsils or throat.  · Swollen, tender lymph nodes or "glands" of the neck or under the jaw.  · Red rash all over the body (rare).  DIAGNOSIS   Many different infections can cause the same symptoms. A test must be done to confirm the diagnosis so the right treatment can be given. A "rapid strep test" can help your caregiver make the diagnosis in a few minutes. If this test is not available, a light swab of the infected area can be used for a throat culture test. If a throat culture test is done, results are usually available in a day or two.  TREATMENT   Strep throat is treated with antibiotic medicine.  HOME CARE INSTRUCTIONS   · Gargle with 1 tsp of salt in 1 cup of warm water, 3 4 times per day or as needed for comfort.  · Family members who also have a sore throat or fever should be tested for strep throat and treated with antibiotics if they have the strep infection.  · Make sure everyone in your household washes their hands well.  · Do not share food, drinking cups, or personal items that could cause the infection to spread to others.  · You may need to eat a soft food diet until your sore throat gets better.  · Drink enough water and fluids to keep your urine clear or pale yellow. This will help prevent dehydration.  · Get plenty of  rest.  · Stay home from school, daycare, or work until you have been on antibiotics for 24 hours.  · Only take over-the-counter or prescription medicines for pain, discomfort, or fever as directed by your caregiver.  · If antibiotics are prescribed, take them as directed. Finish them even if you start to feel better.  SEEK MEDICAL CARE IF:   · The glands in your neck continue to enlarge.  · You develop a rash, cough, or earache.  · You cough up green, yellow-brown, or bloody sputum.  · You have pain or discomfort not controlled by medicines.  · Your problems seem to be getting worse rather than better.  SEEK IMMEDIATE MEDICAL CARE IF:   · You develop any new symptoms such as vomiting, severe headache, stiff or painful neck, chest pain, shortness of breath, or trouble swallowing.  · You develop severe throat pain, drooling, or changes in your voice.  · You develop swelling of the neck, or the skin on the neck becomes red and tender.  · You have a fever.  · You develop signs of dehydration, such as fatigue, dry mouth, and decreased urination.  · You become increasingly sleepy, or you cannot wake up completely.  Document Released: 04/06/2000 Document Revised: 03/26/2012 Document Reviewed: 06/08/2010  [employer]® Patient Information ©2014 [employer].

## 2012-11-06 NOTE — Progress Notes (Addendum)
  Subjective:    Patient ID: Ashley Holland, female    DOB: Jan 04, 1941, 72 y.o.   MRN: [id number]  HPI   PAtient here today c/o sore throat- headache and fatigue. Started about 2 days ago- was having chills.    Review of Systems  Constitutional: Positive for chills and fatigue. Negative for fever, activity change and appetite change.  HENT: Positive for sore throat, trouble swallowing and voice change (hoarce). Negative for ear pain, congestion, rhinorrhea, sneezing, postnasal drip, sinus pressure and tinnitus.   Respiratory: Negative for cough and shortness of breath.   Cardiovascular: Negative for chest pain, palpitations and leg swelling.  Genitourinary: Negative.   Neurological: Negative.        Objective:   Physical Exam  Constitutional: She appears well-developed and well-nourished.  HENT:  Right Ear: Hearing, tympanic membrane, external ear and ear canal normal.  Left Ear: Hearing, tympanic membrane, external ear and ear canal normal.  Nose: No mucosal edema or rhinorrhea. Right sinus exhibits no maxillary sinus tenderness and no frontal sinus tenderness. Left sinus exhibits no maxillary sinus tenderness and no frontal sinus tenderness.  Mouth/Throat: Posterior oropharyngeal edema (mild) and posterior oropharyngeal erythema (mild) present.  Cardiovascular: Normal rate, regular rhythm and normal heart sounds.   Pulmonary/Chest: Effort normal and breath sounds normal.  Abdominal: Soft. Bowel sounds are normal.  Skin: Skin is warm.    Results for orders placed in visit on 11/06/12  POCT RAPID STREP A (OFFICE)      Result Value Range   Rapid Strep A Screen Positive (*) Negative     BP 130/72  Pulse 65  Temp(Src) 97.1 F (36.2 C) (Oral)  Ht 5' 5.5" (1.664 m)  Wt 162 lb (73.483 kg)  BMI 26.54 kg/m2    Assessment & Plan:  1. Sore throat - POCT rapid strep A  2. Upper respiratory infection *1. Take meds as prescribed 2. Use a cool mist humidifier especially during  the winter months and when heat has  been humid. 3. Use saline nose sprays frequently 4. Saline irrigations of the nose can be very helpful if done frequently.  * 4X daily for 1 week*  * Use of a nettie pot can be helpful with this. Follow directions with this* 5. Drink plenty of fluids 6. Keep thermostat turn down low 7.For any cough or congestion  Use plain Mucinex- regular strength or max strength is fine   * Children- consult with Pharmacist for dosing 8. For fever or aces or pains- take tylenol or ibuprofen appropriate for age and weight.  * for fevers greater than 101 orally you may alternate ibuprofen and tylenol every  3 hours.  Meds ordered this encounter  Medications  . omeprazole (PRILOSEC) 20 MG capsule    Sig: Take 20 mg by mouth daily.  . Chlorphen-PE-Acetaminophen (NOREL AD) 4-10-325 MG TABS    Sig: Take 1 tablet by mouth daily.    Dispense:  12 tablet    Refill:  0    Order Specific Question:  Supervising Provider    Answer:  Ernestina Penna [[id number]]  . amoxicillin (AMOXIL) 875 MG tablet    Sig: Take 1 tablet (875 mg total) by mouth 2 (two) times daily.    Dispense:  20 tablet    Refill:  0    Order Specific Question:  Supervising Provider    Answer:  Ernestina Penna [[id number]]      Mary-Margaret Daphine Deutscher, FNP

## 2012-11-10 ENCOUNTER — Telehealth: Payer: Self-pay | Admitting: Nurse Practitioner

## 2012-11-10 NOTE — Telephone Encounter (Signed)
Pt is going to continue her amox. Her symptoms of the uti are improving so i advised patient to continue amox and if symptoms dont improve to give [place] a call.

## 2012-11-18 ENCOUNTER — Ambulatory Visit (INDEPENDENT_AMBULATORY_CARE_PROVIDER_SITE_OTHER): Payer: Medicare Other | Admitting: Family Medicine

## 2012-11-18 ENCOUNTER — Encounter: Payer: Self-pay | Admitting: Family Medicine

## 2012-11-18 ENCOUNTER — Telehealth: Payer: Self-pay | Admitting: Family Medicine

## 2012-11-18 VITALS — BP 142/70 | HR 88 | Temp 102.2°F | Ht 65.5 in | Wt 162.0 lb

## 2012-11-18 DIAGNOSIS — N390 Urinary tract infection, site not specified: Secondary | ICD-10-CM

## 2012-11-18 DIAGNOSIS — N10 Acute tubulo-interstitial nephritis: Secondary | ICD-10-CM

## 2012-11-18 LAB — POCT URINALYSIS DIPSTICK
Bilirubin, UA: NEGATIVE
Glucose, UA: NEGATIVE
Ketones, UA: NEGATIVE
Leukocytes, UA: NEGATIVE
Nitrite, UA: NEGATIVE
Spec Grav, UA: 1.015
Urobilinogen, UA: NEGATIVE
pH, UA: 7.5

## 2012-11-18 LAB — POCT UA - MICROSCOPIC ONLY
Casts, Ur, LPF, POC: NEGATIVE
Yeast, UA: NEGATIVE

## 2012-11-18 MED ORDER — CIPROFLOXACIN HCL 500 MG PO TABS: 500.0000 mg | ORAL_TABLET | Freq: Two times a day (BID) | ORAL | Status: DC

## 2012-11-18 MED ORDER — CEFTRIAXONE SODIUM 1 G IJ SOLR
1.0000 g | Freq: Once | INTRAMUSCULAR | Status: AC
  Administered 2012-11-18: 1 g via INTRAMUSCULAR

## 2012-11-18 NOTE — Progress Notes (Signed)
Patient ID: Ashley Holland, female   DOB: [birth year], 72 y.o.   MRN: [id number] SUBJECTIVE: CC: Chief Complaint  Patient presents with  . Chills  . Urinary Tract Infection  ] HPI Stared with dysuria  Several days ago. Drank a lot of fluids and cranberry and symptoms got better. Today started to have chills and backache. And dysuria returned. No history of kidney stones.  Doing great with the prilosec with her nausea and burping.   Past Medical History  Diagnosis Date  . Unspecified essential hypertension   . Precordial pain   . Other and unspecified hyperlipidemia   . Arthritis     Per medical history form dated 11/04/09.  [name] Rectal prolapse   . Female bladder prolapse   . Malignant neoplasm of breast (female), unspecified site    Past Surgical History  Procedure Laterality Date  . Abdominal hysterectomy    . Cholecystectomy    . Colonoscopy    . Djd    . Breast lumpectomy  2010    Right  . Upper gastrointestinal endoscopy  Sept 2012  . Eus  04/25/2011    Procedure: UPPER ENDOSCOPIC ULTRASOUND (EUS) RADIAL;  Surgeon: Freddy Jaksch, MD;  Location: [facility];  Service: Endoscopy;  Laterality: N/A;   History   Social History  . Marital Status: Married    Spouse Name: N/A    Number of Children: 2  . Years of Education: N/A   Occupational History  . Retired     Futures trader   Social History Main Topics  . Smoking status: Never Smoker   . Smokeless tobacco: Not on file  . Alcohol Use: No  . Drug Use: No  . Sexually Active: Not Currently   Other Topics Concern  . Not on file   Social History Narrative  . No narrative on file   Family History  Problem Relation Age of Onset  . Coronary artery disease Mother      CABG age 17  . Hyperlipidemia Mother   . Stroke Mother   . Alcohol abuse Father   . Hypertension Father   . Hypertension Sister   . Anesthesia problems Neg Hx   . Hypotension Neg Hx   . Malignant hyperthermia Neg Hx   . Pseudochol  deficiency Neg Hx    Current Outpatient Prescriptions on File Prior to Visit  Medication Sig Dispense Refill  . anastrozole (ARIMIDEX) 1 MG tablet Take 1 mg by mouth daily.        [name] aspirin 81 MG tablet Take 81 mg by mouth daily.        . Calcium Carbonate-Vitamin D (CALCIUM 600+D) 600-200 MG-UNIT TABS Take 1 tablet by mouth daily.        [name] diltiazem (CARDIZEM CD) 300 MG 24 hr capsule Take 300 mg by mouth daily.        [name] docusate sodium (COLACE) 100 MG capsule Take 100 mg by mouth 2 (two) times daily.      . Multiple Vitamin (MULTIVITAMIN) tablet Take 1 tablet by mouth daily.        [name] omeprazole (PRILOSEC) 20 MG capsule Take 20 mg by mouth daily.      . Probiotic Product (TRUBIOTICS) CAPS Take 1 capsule by mouth daily.      . simvastatin (ZOCOR) 20 MG tablet Take 10 mg by mouth at bedtime.         No current facility-administered medications on file prior to visit.   No Known Allergies  There is no immunization history on file for this patient. Prior to Admission medications   Medication Sig Start Date End Date Taking? Authorizing Provider  anastrozole (ARIMIDEX) 1 MG tablet Take 1 mg by mouth daily.     Yes Historical Provider, MD  aspirin 81 MG tablet Take 81 mg by mouth daily.     Yes Historical Provider, MD  Calcium Carbonate-Vitamin D (CALCIUM 600+D) 600-200 MG-UNIT TABS Take 1 tablet by mouth daily.     Yes Historical Provider, MD  diltiazem (CARDIZEM CD) 300 MG 24 hr capsule Take 300 mg by mouth daily.     Yes Historical Provider, MD  docusate sodium (COLACE) 100 MG capsule Take 100 mg by mouth 2 (two) times daily.   Yes Historical Provider, MD  Multiple Vitamin (MULTIVITAMIN) tablet Take 1 tablet by mouth daily.     Yes Historical Provider, MD  omeprazole (PRILOSEC) 20 MG capsule Take 20 mg by mouth daily.   Yes Historical Provider, MD  Probiotic Product (TRUBIOTICS) CAPS Take 1 capsule by mouth daily.   Yes Historical Provider, MD  simvastatin (ZOCOR) 20 MG tablet Take 10 mg by  mouth at bedtime.     Yes Historical Provider, MD  ciprofloxacin (CIPRO) 500 MG tablet Take 1 tablet (500 mg total) by mouth 2 (two) times daily. 11/18/12   Ileana Ladd, MD    ROS: As above in the HPI. All other systems are stable or negative.  OBJECTIVE: APPEARANCE:  Patient in no acute distress.The patient appeared well nourished and normally developed. Acyanotic. Waist: VITAL SIGNS:BP 142/70  Pulse 88  Temp(Src) 102.2 F (39 C) (Oral)  Ht 5' 5.5" (1.664 m)  Wt 162 lb (73.483 kg)  BMI 26.54 kg/m2  WF  SKIN: febrile and  Dry without overt rashes, tattoos and scars  HEAD and Neck: without JVD, Head and scalp: normal Eyes:No scleral icterus. Fundi normal, eye movements normal. Ears: Auricle normal, canal normal, Tympanic membranes normal, insufflation normal. Nose: normal Throat: normal Neck & thyroid: normal  CHEST & LUNGS: Chest wall: normal Lungs: Clear  CVS: Reveals the PMI to be normally located. Regular rhythm, First and Second Heart sounds are normal,  absence of murmurs, rubs or gallops. Peripheral vasculature: Radial pulses: normal Dorsal pedis pulses: normal Posterior pulses: normal  ABDOMEN:  Appearance: soft Benign, no organomegaly, no masses, no Abdominal Aortic enlargement. No Guarding , no rebound. No Bruits. Right CVA mild discomfort and lower back discomfort.  Bowel sounds: normal  RECTAL: N/A GU: N/A  EXTREMETIES: nonedematous. Both Femoral and Pedal pulses are normal.  MUSCULOSKELETAL:  Spine: normal Joints: intact  NEUROLOGIC: oriented to time,place and person; nonfocal. Strength is normal Sensory is normal Reflexes are normal Cranial Nerves are normal.  ASSESSMENT: UTI (urinary tract infection) - Plan: POCT UA - Microscopic Only, POCT urinalysis dipstick, cefTRIAXone (ROCEPHIN) injection 1 g  Acute pyelonephritis - Plan: Urine culture, ciprofloxacin (CIPRO) 500 MG tablet, cefTRIAXone (ROCEPHIN) injection 1  g  PLAN:  Orders Placed This Encounter  Procedures  . Urine culture  . POCT UA - Microscopic Only  . POCT urinalysis dipstick   Meds ordered this encounter  Medications  . ciprofloxacin (CIPRO) 500 MG tablet    Sig: Take 1 tablet (500 mg total) by mouth 2 (two) times daily.    Dispense:  20 tablet    Refill:  0  . cefTRIAXone (ROCEPHIN) injection 1 g    Sig:    Results for orders placed in visit on 11/18/12  POCT  UA - MICROSCOPIC ONLY      Result Value Range   WBC, Ur, HPF, POC occ     RBC, urine, microscopic 3-10     Bacteria, U Microscopic mod     Mucus, UA trace     Epithelial cells, urine per micros few     Crystals, Ur, HPF, POC occ     Casts, Ur, LPF, POC neg     Yeast, UA neg    POCT URINALYSIS DIPSTICK      Result Value Range   Color, UA yellow     Clarity, UA cloudy     Glucose, UA neg     Bilirubin, UA neg     Ketones, UA neg     Spec Grav, UA 1.015     Blood, UA mod     pH, UA 7.5     Protein, UA small     Urobilinogen, UA negative     Nitrite, UA neg     Leukocytes, UA Negative     Return in about 2 days (around 11/20/2012) for recheck UTI.  Fluids tylenol. Call stat if vomiting or any worsening.  Manu Rubey P. [place] Charon, M.D.

## 2012-11-18 NOTE — Telephone Encounter (Signed)
appt given  

## 2012-11-20 ENCOUNTER — Encounter: Payer: Self-pay | Admitting: Family Medicine

## 2012-11-20 ENCOUNTER — Ambulatory Visit (INDEPENDENT_AMBULATORY_CARE_PROVIDER_SITE_OTHER): Payer: Medicare Other | Admitting: Family Medicine

## 2012-11-20 VITALS — BP 139/74 | HR 60 | Temp 97.6°F | Wt 158.8 lb

## 2012-11-20 DIAGNOSIS — N12 Tubulo-interstitial nephritis, not specified as acute or chronic: Secondary | ICD-10-CM

## 2012-11-20 LAB — URINE CULTURE

## 2012-11-20 NOTE — Progress Notes (Signed)
Patient ID: Ashley Holland, female   DOB: [birth year], 72 y.o.   MRN: [id number] SUBJECTIVE: CC: Chief Complaint  Patient presents with  . Follow-up    states doing alot better still on antibiotic  -follow up UTI/ Kidney   HPI: Here for folow up of pyelonephritis. Feels great fever broke with IM rocephin. No problems with cipro. Urine fine now. Past Medical History  Diagnosis Date  . Unspecified essential hypertension   . Precordial pain   . Other and unspecified hyperlipidemia   . Arthritis     Per medical history form dated 11/04/09.  [name] Rectal prolapse   . Female bladder prolapse   . Malignant neoplasm of breast (female), unspecified site    Past Surgical History  Procedure Laterality Date  . Abdominal hysterectomy    . Cholecystectomy    . Colonoscopy    . Djd    . Breast lumpectomy  2010    Right  . Upper gastrointestinal endoscopy  Sept 2012  . Eus  04/25/2011    Procedure: UPPER ENDOSCOPIC ULTRASOUND (EUS) RADIAL;  Surgeon: Freddy Jaksch, MD;  Location: [facility];  Service: Endoscopy;  Laterality: N/A;   Current Outpatient Prescriptions on File Prior to Visit  Medication Sig Dispense Refill  . ciprofloxacin (CIPRO) 500 MG tablet Take 1 tablet (500 mg total) by mouth 2 (two) times daily.  20 tablet  0  . anastrozole (ARIMIDEX) 1 MG tablet Take 1 mg by mouth daily.        [name] aspirin 81 MG tablet Take 81 mg by mouth daily.        . Calcium Carbonate-Vitamin D (CALCIUM 600+D) 600-200 MG-UNIT TABS Take 1 tablet by mouth daily.        [name] diltiazem (CARDIZEM CD) 300 MG 24 hr capsule Take 300 mg by mouth daily.        [name] docusate sodium (COLACE) 100 MG capsule Take 100 mg by mouth 2 (two) times daily.      . Multiple Vitamin (MULTIVITAMIN) tablet Take 1 tablet by mouth daily.        [name] omeprazole (PRILOSEC) 20 MG capsule Take 20 mg by mouth daily.      . Probiotic Product (TRUBIOTICS) CAPS Take 1 capsule by mouth daily.      . simvastatin (ZOCOR) 20 MG tablet Take 10 mg by mouth  at bedtime.         No current facility-administered medications on file prior to visit.   No Known Allergies  There is no immunization history on file for this patient. History   Social History  . Marital Status: Married    Spouse Name: N/A    Number of Children: 2  . Years of Education: N/A   Occupational History  . Retired     Futures trader   Social History Main Topics  . Smoking status: Never Smoker   . Smokeless tobacco: Not on file  . Alcohol Use: No  . Drug Use: No  . Sexually Active: Not Currently   Other Topics Concern  . Not on file   Social History Narrative  . No narrative on file      ROS: As above in the HPI. All other systems are stable or negative.  OBJECTIVE: APPEARANCE:  Patient in no acute distress.The patient appeared well nourished and normally developed. Acyanotic. Waist: VITAL SIGNS:BP 139/74  Pulse 60  Temp(Src) 97.6 F (36.4 C) (Oral)  Wt 158 lb 12.8 oz (72.031 kg)  BMI 26.01  kg/m2 WF looks well today.  SKIN: warm and  Dry without overt rashes, tattoos and scars  HEAD and Neck: without JVD, Head and scalp: normal Eyes:No scleral icterus. Fundi normal, eye movements normal. Ears: Auricle normal, canal normal, Tympanic membranes normal, insufflation normal. Nose: normal Throat: normal Neck & thyroid: normal  CHEST & LUNGS: Chest wall: normal Lungs: Clear  CVS: Reveals the PMI to be normally located. Regular rhythm, First and Second Heart sounds are normal,  absence of murmurs, rubs or gallops. Peripheral vasculature: Radial pulses: normal Dorsal pedis pulses: normal Posterior pulses: normal  ABDOMEN:  Appearance: normal Benign, no organomegaly, no masses, no Abdominal Aortic enlargement. No Guarding , no rebound. No Bruits. Bowel sounds: normal  RECTAL: N/A GU: N/A  EXTREMETIES: nonedematous. Both Femoral and Pedal pulses are normal.  MUSCULOSKELETAL:  Spine: normal Joints: intact  NEUROLOGIC:  oriented to time,place and person; nonfocal. Strength is normal Sensory is normal Reflexes are normal Cranial Nerves are normal.  ASSESSMENT: Pyelonephritis - responded to treatment and resolving - Plan: Urine culture  PLAN:  Results for orders placed in visit on 11/18/12  URINE CULTURE      Result Value Range   Urine Culture, Routine Final report (*)    Result 1 Escherichia coli (*)    ANTIMICROBIAL SUSCEPTIBILITY Comment    POCT UA - MICROSCOPIC ONLY      Result Value Range   WBC, Ur, HPF, POC occ     RBC, urine, microscopic 3-10     Bacteria, U Microscopic mod     Mucus, UA trace     Epithelial cells, urine per micros few     Crystals, Ur, HPF, POC occ     Casts, Ur, LPF, POC neg     Yeast, UA neg    POCT URINALYSIS DIPSTICK      Result Value Range   Color, UA yellow     Clarity, UA cloudy     Glucose, UA neg     Bilirubin, UA neg     Ketones, UA neg     Spec Grav, UA 1.015     Blood, UA mod     pH, UA 7.5     Protein, UA small     Urobilinogen, UA negative     Nitrite, UA neg     Leukocytes, UA Negative      Same medications.  Orders Placed This Encounter  Procedures  . Urine culture    Standing Status: Future     Number of Occurrences:      Standing Expiration Date: 11/20/2013    Return if symptoms worsen or fail to improve.  Azriel Dancy P. [place] Charon, M.D.

## 2012-11-26 ENCOUNTER — Other Ambulatory Visit: Payer: Self-pay

## 2012-12-05 ENCOUNTER — Other Ambulatory Visit (INDEPENDENT_AMBULATORY_CARE_PROVIDER_SITE_OTHER): Payer: Medicare Other

## 2012-12-05 DIAGNOSIS — N12 Tubulo-interstitial nephritis, not specified as acute or chronic: Secondary | ICD-10-CM

## 2012-12-07 LAB — URINE CULTURE

## 2012-12-07 NOTE — Progress Notes (Signed)
Quick Note:  Call patient. Labs normal. Pyelonephritis cleared up. No change in plan. ______

## 2012-12-08 ENCOUNTER — Telehealth: Payer: Self-pay | Admitting: Family Medicine

## 2012-12-08 NOTE — Telephone Encounter (Signed)
Pt called

## 2012-12-09 ENCOUNTER — Encounter (INDEPENDENT_AMBULATORY_CARE_PROVIDER_SITE_OTHER): Payer: Self-pay | Admitting: General Surgery

## 2012-12-09 ENCOUNTER — Ambulatory Visit (INDEPENDENT_AMBULATORY_CARE_PROVIDER_SITE_OTHER): Payer: Medicare Other | Admitting: General Surgery

## 2012-12-09 VITALS — BP 140/78 | HR 64 | Temp 98.2°F | Resp 14 | Ht 65.5 in | Wt 159.2 lb

## 2012-12-09 DIAGNOSIS — C50911 Malignant neoplasm of unspecified site of right female breast: Secondary | ICD-10-CM

## 2012-12-09 DIAGNOSIS — C50919 Malignant neoplasm of unspecified site of unspecified female breast: Secondary | ICD-10-CM

## 2012-12-09 NOTE — Patient Instructions (Signed)
Your physical examination today is normal. There is no evidence of cancer.  As you know, your mammograms in June were also unremarkable. No focal abnormality.  Continue to take arimidex.  It is very important that you  establish with another medical oncologist, either in [place] or  [place]. You need to be checked every 6 months because you are on arimadex.  Repeat bilateral mammograms in May, 2015  Return to see Dr. Derrell Lolling in one year.[name]

## 2012-12-09 NOTE — Progress Notes (Signed)
Patient ID: EUTHA CUDE, female   DOB: [birth year], 72 y.o.   MRN: [id number] History: Ashley Holland is a 72 y.o. female. She returns for long-term followup of her right breast cancer.  On 11/16/2009 this patient underwent right partial mastectomy and sentinel node biopsy and reexcision of superior margins. Pathology showed invasive ductal carcinoma, receptor positive, HER-2 negative, proliferation index 18%, 1.8 cm tumor, negative margins, stage T1c., N0. She completed radiation therapy under the supervision of Dr. Lurline Hare.  She was being followed by Dr. Earma Reading, but has not yet reestablished with another medical oncologist. I stressed to her that that was very important. She states that her breast feels fine. Her arms feel fine. She has no complaints.  Bilateral mammograms performed at [facility] on 06.03.2014 looked fine. Category 2.  She is on arimidex and tolerating that well.  ROS: she feels good. 10 system review of systems is negative except as described above.  Exam:  General: Patient looks good. Very fit for her age. No distress. Neck: No adenopathy or mass Lungs: Clear to auscultation bilaterally Heart: Regular rate and rhythm. No murmur. No ectopy Breasts: Right breast reveals well-healed lumpectomy scar laterally at 9:00 and well-healed axillary scar. No masses in either breast. No other skin changes. No axillary adenopathy  Assessment: Invasive ductal carcinoma right breast, pathologic stage TI C., N0, receptor positive, HER-2 negative, proliferation index 18% No evidence of recurrence 3 years following right partial mastectomy and sentinel load biopsy and adjuvant radiation therapy and ongoing adjuvant Dermadex  Plan: Continue arimadex She will reestablish with another medical oncologist, either in [place]  or in [place] Repeat mammograms in May 2015 Return to see me in one year   Eunice Oldaker M. Derrell Lolling, M.D., [facility] General and  Minimally invasive Surgery Breast and Colorectal Surgery Office:   [phone] Pager:   [phone]

## 2012-12-11 ENCOUNTER — Encounter: Payer: Self-pay | Admitting: Family Medicine

## 2012-12-11 ENCOUNTER — Ambulatory Visit (INDEPENDENT_AMBULATORY_CARE_PROVIDER_SITE_OTHER): Payer: Medicare Other | Admitting: Family Medicine

## 2012-12-11 VITALS — BP 141/76 | HR 61 | Temp 97.8°F | Wt 158.4 lb

## 2012-12-11 DIAGNOSIS — C50919 Malignant neoplasm of unspecified site of unspecified female breast: Secondary | ICD-10-CM

## 2012-12-11 DIAGNOSIS — M1990 Unspecified osteoarthritis, unspecified site: Secondary | ICD-10-CM

## 2012-12-11 DIAGNOSIS — E785 Hyperlipidemia, unspecified: Secondary | ICD-10-CM

## 2012-12-11 DIAGNOSIS — C50911 Malignant neoplasm of unspecified site of right female breast: Secondary | ICD-10-CM

## 2012-12-11 DIAGNOSIS — N951 Menopausal and female climacteric states: Secondary | ICD-10-CM

## 2012-12-11 DIAGNOSIS — R634 Abnormal weight loss: Secondary | ICD-10-CM

## 2012-12-11 DIAGNOSIS — I10 Essential (primary) hypertension: Secondary | ICD-10-CM

## 2012-12-11 DIAGNOSIS — Z780 Asymptomatic menopausal state: Secondary | ICD-10-CM

## 2012-12-11 DIAGNOSIS — M129 Arthropathy, unspecified: Secondary | ICD-10-CM

## 2012-12-11 MED ORDER — DILTIAZEM HCL ER COATED BEADS 300 MG PO CP24: 300.0000 mg | ORAL_CAPSULE | Freq: Every day | ORAL | Status: DC

## 2012-12-11 MED ORDER — SIMVASTATIN 20 MG PO TABS: 10.0000 mg | ORAL_TABLET | Freq: Every day | ORAL | Status: DC

## 2012-12-11 NOTE — Progress Notes (Signed)
Patient ID: Ashley Holland, female   DOB: 11-23-1940, 72 y.o.   MRN: [id number] SUBJECTIVE: CC: Chief Complaint  Patient presents with  . Follow-up    2 month follow up wants thyroid checked  . Medication Refill    wants dilatiazem changed to something cheaper and refill 90 supply on simvastattin     HPI: Here for  Routine follow up. Recent pyelonephritis.  Patient is here for follow up of hyperlipidemia/HTN denies Headache;denies Chest Pain;denies weakness;denies Shortness of Breath and orthopnea;denies Visual changes;denies palpitations;denies cough;denies pedal edema;denies symptoms of TIA or stroke;deniesClaudication symptoms. admits to Compliance with medications; denies Problems with medications. BPs at home: 105/66 to 116/73 Wants to check her thyroid because even though her weight loss was intentional, there is a tendency for thyroid disease and over active thyroid in the family.  Stomach problem resolved Past Medical History  Diagnosis Date  . Unspecified essential hypertension   . Precordial pain   . Other and unspecified hyperlipidemia   . Arthritis     Per medical history form dated 11/04/09.  [name] Rectal prolapse   . Female bladder prolapse   . Malignant neoplasm of breast (female), unspecified site    Past Surgical History  Procedure Laterality Date  . Abdominal hysterectomy    . Cholecystectomy    . Colonoscopy    . Djd    . Breast lumpectomy  2010    Right  . Upper gastrointestinal endoscopy  Sept 2012  . Eus  04/25/2011    Procedure: UPPER ENDOSCOPIC ULTRASOUND (EUS) RADIAL;  Surgeon: Freddy Jaksch, MD;  Location: [facility];  Service: Endoscopy;  Laterality: N/A;   History   Social History  . Marital Status: Married    Spouse Name: N/A    Number of Children: 2  . Years of Education: N/A   Occupational History  . Retired     Futures trader   Social History Main Topics  . Smoking status: Never Smoker   . Smokeless tobacco: Never Used  .  Alcohol Use: No  . Drug Use: No  . Sexual Activity: Not Currently   Other Topics Concern  . Not on file   Social History Narrative  . No narrative on file   Family History  Problem Relation Age of Onset  . Coronary artery disease Mother      CABG age 21  . Hyperlipidemia Mother   . Stroke Mother   . Alcohol abuse Father   . Hypertension Father   . Hypertension Sister   . Anesthesia problems Neg Hx   . Hypotension Neg Hx   . Malignant hyperthermia Neg Hx   . Pseudochol deficiency Neg Hx    Current Outpatient Prescriptions on File Prior to Visit  Medication Sig Dispense Refill  . anastrozole (ARIMIDEX) 1 MG tablet Take 1 mg by mouth daily.        [name] aspirin 81 MG tablet Take 81 mg by mouth daily.        . Calcium Carbonate-Vitamin D (CALCIUM 600+D) 600-200 MG-UNIT TABS Take 1 tablet by mouth daily.        [name] diltiazem (CARDIZEM CD) 300 MG 24 hr capsule Take 300 mg by mouth daily.        [name] docusate sodium (COLACE) 100 MG capsule Take 100 mg by mouth 2 (two) times daily.      . Multiple Vitamin (MULTIVITAMIN) tablet Take 1 tablet by mouth daily.        [name] omeprazole (PRILOSEC) 20  MG capsule Take 20 mg by mouth daily.      . Probiotic Product (TRUBIOTICS) CAPS Take 1 capsule by mouth daily.      . simvastatin (ZOCOR) 20 MG tablet Take 10 mg by mouth at bedtime.         No current facility-administered medications on file prior to visit.   No Known Allergies  There is no immunization history on file for this patient. Prior to Admission medications   Medication Sig Start Date End Date Taking? Authorizing Provider  anastrozole (ARIMIDEX) 1 MG tablet Take 1 mg by mouth daily.     Yes Historical Provider, MD  aspirin 81 MG tablet Take 81 mg by mouth daily.     Yes Historical Provider, MD  Calcium Carbonate-Vitamin D (CALCIUM 600+D) 600-200 MG-UNIT TABS Take 1 tablet by mouth daily.     Yes Historical Provider, MD  diltiazem (CARDIZEM CD) 300 MG 24 hr capsule Take 300 mg by mouth  daily.     Yes Historical Provider, MD  docusate sodium (COLACE) 100 MG capsule Take 100 mg by mouth 2 (two) times daily.   Yes Historical Provider, MD  Multiple Vitamin (MULTIVITAMIN) tablet Take 1 tablet by mouth daily.     Yes Historical Provider, MD  omeprazole (PRILOSEC) 20 MG capsule Take 20 mg by mouth daily.   Yes Historical Provider, MD  Probiotic Product (TRUBIOTICS) CAPS Take 1 capsule by mouth daily.   Yes Historical Provider, MD  simvastatin (ZOCOR) 20 MG tablet Take 10 mg by mouth at bedtime.     Yes Historical Provider, MD    ROS: As above in the HPI. All other systems are stable or negative.  OBJECTIVE: APPEARANCE:  Patient in no acute distress.The patient appeared well nourished and normally developed. Acyanotic. Waist: VITAL SIGNS:BP 141/76  Pulse 61  Temp(Src) 97.8 F (36.6 C) (Oral)  Wt 158 lb 6.4 oz (71.85 kg)  BMI 25.95 kg/m2 WF  SKIN: warm and  Dry without overt rashes, tattoos and scars  HEAD and Neck: without JVD, Head and scalp: normal Eyes:No scleral icterus. Fundi normal, eye movements normal. Ears: Auricle normal, canal normal, Tympanic membranes normal, insufflation normal. Nose: normal Throat: normal Neck & thyroid: normal  CHEST & LUNGS: Chest wall: normal Lungs: Clear  CVS: Reveals the PMI to be normally located. Regular rhythm, First and Second Heart sounds are normal,  absence of murmurs, rubs or gallops. Peripheral vasculature: Radial pulses: normal Dorsal pedis pulses: normal Posterior pulses: normal  ABDOMEN:  Appearance: normal Benign, no organomegaly, no masses, no Abdominal Aortic enlargement. No Guarding , no rebound. No Bruits. Bowel sounds: normal  RECTAL: N/A GU: N/A  EXTREMETIES: nonedematous.  MUSCULOSKELETAL:  Spine: normal Joints: intact  NEUROLOGIC: oriented to time,place and person; nonfocal. Strength is normal Sensory is normal Reflexes are normal Cranial Nerves are normal.  ASSESSMENT: HTN  (hypertension) - Plan: diltiazem (CARDIZEM CD) 300 MG 24 hr capsule, simvastatin (ZOCOR) 20 MG tablet, Thyroid Panel With TSH  Other and unspecified hyperlipidemia  Cancer of right breast  Menopause  Arthritis/joint pain  Loss of weight - Plan: Thyroid Panel With TSH   PLAN:  Orders Placed This Encounter  Procedures  . Thyroid Panel With TSH    Meds ordered this encounter  Medications  . diltiazem (CARDIZEM CD) 300 MG 24 hr capsule    Sig: Take 1 capsule (300 mg total) by mouth daily.    Dispense:  90 capsule    Refill:  3  . simvastatin (  ZOCOR) 20 MG tablet    Sig: Take 0.5 tablets (10 mg total) by mouth at bedtime.    Dispense:  90 tablet    Refill:  3    Results for orders placed in visit on 12/05/12  URINE CULTURE      Result Value Range   Urine Culture, Routine Final report     Result 1 Comment      Return in about 4 months (around 04/12/2013).  Natividad Schlosser P. [place] Charon, M.D.

## 2012-12-12 LAB — THYROID PANEL WITH TSH
Free Thyroxine Index: 1.7 (ref 1.2–4.9)
T3 Uptake Ratio: 20 % — ABNORMAL LOW (ref 24–39)
T4, Total: 8.3 ug/dL (ref 4.5–12.0)
TSH: 0.729 u[IU]/mL (ref 0.450–4.500)

## 2012-12-12 NOTE — Progress Notes (Signed)
Quick Note:  Call patient. Labs normal. No change in plan. ______ 

## 2012-12-24 NOTE — Progress Notes (Signed)
Patient came in for labs only.

## 2013-01-08 DIAGNOSIS — R634 Abnormal weight loss: Secondary | ICD-10-CM

## 2013-01-08 DIAGNOSIS — I498 Other specified cardiac arrhythmias: Secondary | ICD-10-CM

## 2013-01-08 DIAGNOSIS — Z23 Encounter for immunization: Secondary | ICD-10-CM

## 2013-01-08 DIAGNOSIS — C50919 Malignant neoplasm of unspecified site of unspecified female breast: Secondary | ICD-10-CM

## 2013-02-26 ENCOUNTER — Other Ambulatory Visit: Payer: Self-pay

## 2013-03-17 ENCOUNTER — Encounter: Payer: Self-pay | Admitting: Family Medicine

## 2013-03-17 ENCOUNTER — Ambulatory Visit (INDEPENDENT_AMBULATORY_CARE_PROVIDER_SITE_OTHER): Payer: Medicare Other | Admitting: Family Medicine

## 2013-03-17 VITALS — BP 96/62 | HR 64 | Temp 97.4°F | Ht 65.5 in | Wt 158.2 lb

## 2013-03-17 DIAGNOSIS — M129 Arthropathy, unspecified: Secondary | ICD-10-CM

## 2013-03-17 DIAGNOSIS — E785 Hyperlipidemia, unspecified: Secondary | ICD-10-CM

## 2013-03-17 DIAGNOSIS — C50919 Malignant neoplasm of unspecified site of unspecified female breast: Secondary | ICD-10-CM

## 2013-03-17 DIAGNOSIS — C50911 Malignant neoplasm of unspecified site of right female breast: Secondary | ICD-10-CM

## 2013-03-17 DIAGNOSIS — N951 Menopausal and female climacteric states: Secondary | ICD-10-CM

## 2013-03-17 DIAGNOSIS — Z780 Asymptomatic menopausal state: Secondary | ICD-10-CM

## 2013-03-17 DIAGNOSIS — M1990 Unspecified osteoarthritis, unspecified site: Secondary | ICD-10-CM

## 2013-03-17 DIAGNOSIS — I10 Essential (primary) hypertension: Secondary | ICD-10-CM

## 2013-03-17 NOTE — Progress Notes (Signed)
9:15 am clonidine 0.1mg  -1 tablet given po VO Dr Modesto Charon  Lot [id number] exp [phone]. 9"30 am BP 149 83 left  Arm sitting no complaints 9:45 AM  bp LEFT ARM SITTING 152 80  NO COMPLAINTS 10 AM BP left arm sitting  110 71 10:15 AM BP left arm  96/62

## 2013-03-17 NOTE — Progress Notes (Signed)
Patient ID: Ashley Holland, female   DOB: 11-Mar-1941, 72 y.o.   MRN: [id number] SUBJECTIVE: CC: Chief Complaint  Patient presents with  . Follow-up    3 month follow up BP going up states off benicar since june     HPI: Patient is here for follow up of hyperlipidemia/Htn: Has monitored her BP and for the last couple of weeks her BP has been climbing and she has felt in her head like a pressure like her BP was going up. Noticed and she brought the readings that her BP has gotten up to 190/100. No headache no neurologic symptoms. Restarted the benicar/HCT that she has had from June which was discontinued.Started this 3 days ago. BP readings photographed into the media module. denies Headache;denies Chest Pain;denies weakness;denies Shortness of Breath and orthopnea;denies Visual changes;denies palpitations;denies cough;denies pedal edema;denies symptoms of TIA or stroke;deniesClaudication symptoms. admits to Compliance with medications;  denies Problems with medications.   Past Medical History  Diagnosis Date  . Unspecified essential hypertension   . Precordial pain   . Other and unspecified hyperlipidemia   . Arthritis     Per medical history form dated 11/04/09.  [name] Rectal prolapse   . Female bladder prolapse   . Malignant neoplasm of breast (female), unspecified site    Past Surgical History  Procedure Laterality Date  . Abdominal hysterectomy    . Cholecystectomy    . Colonoscopy    . Djd    . Breast lumpectomy  2010    Right  . Upper gastrointestinal endoscopy  Sept 2012  . Eus  04/25/2011    Procedure: UPPER ENDOSCOPIC ULTRASOUND (EUS) RADIAL;  Surgeon: Freddy Jaksch, MD;  Location: [facility];  Service: Endoscopy;  Laterality: N/A;   History   Social History  . Marital Status: Married    Spouse Name: N/A    Number of Children: 2  . Years of Education: N/A   Occupational History  . Retired     Futures trader   Social History Main Topics  . Smoking  status: Never Smoker   . Smokeless tobacco: Never Used  . Alcohol Use: No  . Drug Use: No  . Sexual Activity: Not Currently   Other Topics Concern  . Not on file   Social History Narrative  . No narrative on file   Family History  Problem Relation Age of Onset  . Coronary artery disease Mother      CABG age 38  . Hyperlipidemia Mother   . Stroke Mother   . Alcohol abuse Father   . Hypertension Father   . Hypertension Sister   . Anesthesia problems Neg Hx   . Hypotension Neg Hx   . Malignant hyperthermia Neg Hx   . Pseudochol deficiency Neg Hx    Current Outpatient Prescriptions on File Prior to Visit  Medication Sig Dispense Refill  . anastrozole (ARIMIDEX) 1 MG tablet Take 1 mg by mouth daily.        [name] aspirin 81 MG tablet Take 81 mg by mouth daily.        . Calcium Carbonate-Vitamin D (CALCIUM 600+D) 600-200 MG-UNIT TABS Take 1 tablet by mouth daily.        [name] diltiazem (CARDIZEM CD) 300 MG 24 hr capsule Take 1 capsule (300 mg total) by mouth daily.  90 capsule  3  . docusate sodium (COLACE) 100 MG capsule Take 100 mg by mouth 2 (two) times daily.      . Multiple Vitamin (  MULTIVITAMIN) tablet Take 1 tablet by mouth daily.        [name] omeprazole (PRILOSEC) 20 MG capsule Take 20 mg by mouth daily.      . Probiotic Product (TRUBIOTICS) CAPS Take 1 capsule by mouth daily.      . simvastatin (ZOCOR) 20 MG tablet Take 0.5 tablets (10 mg total) by mouth at bedtime.  90 tablet  3   No current facility-administered medications on file prior to visit.   No Known Allergies  There is no immunization history on file for this patient. Prior to Admission medications   Medication Sig Start Date End Date Taking? Authorizing Provider  anastrozole (ARIMIDEX) 1 MG tablet Take 1 mg by mouth daily.     Yes Historical Provider, MD  aspirin 81 MG tablet Take 81 mg by mouth daily.     Yes Historical Provider, MD  Calcium Carbonate-Vitamin D (CALCIUM 600+D) 600-200 MG-UNIT TABS Take 1 tablet by  mouth daily.     Yes Historical Provider, MD  diltiazem (CARDIZEM CD) 300 MG 24 hr capsule Take 1 capsule (300 mg total) by mouth daily. 12/11/12  Yes Ileana Ladd, MD  docusate sodium (COLACE) 100 MG capsule Take 100 mg by mouth 2 (two) times daily.   Yes Historical Provider, MD  Multiple Vitamin (MULTIVITAMIN) tablet Take 1 tablet by mouth daily.     Yes Historical Provider, MD  omeprazole (PRILOSEC) 20 MG capsule Take 20 mg by mouth daily.   Yes Historical Provider, MD  Probiotic Product (TRUBIOTICS) CAPS Take 1 capsule by mouth daily.   Yes Historical Provider, MD  simvastatin (ZOCOR) 20 MG tablet Take 0.5 tablets (10 mg total) by mouth at bedtime. 12/11/12  Yes Ileana Ladd, MD     ROS: As above in the HPI. All other systems are stable or negative.  OBJECTIVE: APPEARANCE:  Patient in no acute distress.The patient appeared well nourished and normally developed. Acyanotic. Waist: VITAL SIGNS:BP 96/62  Pulse 64  Temp(Src) 97.4 F (36.3 C) (Oral)  Ht 5' 5.5" (1.664 m)  Wt 158 lb 3.2 oz (71.759 kg)  BMI 25.92 kg/m2 WF  SKIN: warm and  Dry without overt rashes, tattoos and scars  HEAD and Neck: without JVD, Head and scalp: normal Eyes:No scleral icterus. Fundi normal, eye movements normal. Ears: Auricle normal, canal normal, Tympanic membranes normal, insufflation normal. Nose: normal Throat: normal Neck & thyroid: normal  CHEST & LUNGS: Chest wall: normal Lungs: Clear  CVS: Reveals the PMI to be normally located. Regular rhythm, First and Second Heart sounds are normal,  absence of murmurs, rubs or gallops. Peripheral vasculature: Radial pulses: normal Dorsal pedis pulses: normal Posterior pulses: normal  ABDOMEN:  Appearance: normal Benign, no organomegaly, no masses, no Abdominal Aortic enlargement. No Guarding , no rebound. No Bruits. Bowel sounds: normal  RECTAL: N/A GU: N/A  EXTREMETIES: nonedematous.  MUSCULOSKELETAL:  Spine: normal Joints:  intact  NEUROLOGIC: oriented to time,place and person; nonfocal. Strength is normal Sensory is normal Reflexes are normal Cranial Nerves are normal.  Results for orders placed in visit on 12/11/12  THYROID PANEL WITH TSH      Result Value Range   TSH 0.729  0.450 - 4.500 uIU/mL   T4, Total 8.3  4.5 - 12.0 ug/dL   T3 Uptake Ratio 20 (*) 24 - 39 %   Free Thyroxine Index 1.7  1.2 - 4.9    ASSESSMENT: HTN (hypertension) - Plan: EKG 12-Lead, CMP14+EGFR, TSH  Arthritis/joint pain  Cancer of  right breast  Menopause  Other and unspecified hyperlipidemia - Plan: NMR, lipoprofile  Initial BP was very high and symptomatic with a pressure in her head. She was given clonidine 0.1 mg x 1 dose and monitored and her symptoms resolved.  PLAN: EKG: reviewed, normal.  Patient to monitor BP and restart the benicar/HCT as she has done.  Orders Placed This Encounter  Procedures  . CMP14+EGFR  . NMR, lipoprofile  . TSH  . EKG 12-Lead   Meds ordered this encounter  Medications  . olmesartan-hydrochlorothiazide (BENICAR HCT) 40-12.5 MG per tablet    Sig: Take 1 tablet by mouth daily.   There are no discontinued medications. Return in about 1 week (around 03/24/2013) for recheck BP.  Devota Viruet P. Modesto Charon, M.D.

## 2013-03-19 LAB — CMP14+EGFR
ALT: 12 IU/L (ref 0–32)
AST: 27 IU/L (ref 0–40)
Albumin/Globulin Ratio: 2 (ref 1.1–2.5)
Albumin: 4.3 g/dL (ref 3.5–4.8)
Alkaline Phosphatase: 97 IU/L (ref 39–117)
BUN/Creatinine Ratio: 17 (ref 11–26)
BUN: 14 mg/dL (ref 8–27)
CO2: 26 mmol/L (ref 18–29)
Calcium: 9.8 mg/dL (ref 8.6–10.2)
Chloride: 101 mmol/L (ref 97–108)
Creatinine, Ser: 0.83 mg/dL (ref 0.57–1.00)
GFR calc Af Amer: 81 mL/min/{1.73_m2} (ref >59)
GFR calc non Af Amer: 70 mL/min/{1.73_m2} (ref >59)
Globulin, Total: 2.2 g/dL (ref 1.5–4.5)
Glucose: 93 mg/dL (ref 65–99)
Potassium: 4.4 mmol/L (ref 3.5–5.2)
Sodium: 142 mmol/L (ref 134–144)
Total Bilirubin: 0.4 mg/dL (ref 0.0–1.2)
Total Protein: 6.5 g/dL (ref 6.0–8.5)

## 2013-03-19 LAB — NMR, LIPOPROFILE
Cholesterol: 168 mg/dL (ref <200)
HDL Cholesterol by NMR: 69 mg/dL (ref >=40)
HDL Particle Number: 41.7 umol/L (ref >=30.5)
LDL Particle Number: 1133 nmol/L — ABNORMAL HIGH (ref <1000)
LDL Size: 21.2 nm (ref >20.5)
LDLC SERPL CALC-MCNC: 81 mg/dL (ref <100)
LP-IR Score: <25 (ref <=45)
Small LDL Particle Number: 118 nmol/L (ref <=527)
Triglycerides by NMR: 89 mg/dL (ref <150)

## 2013-03-19 LAB — TSH: TSH: 1.3 u[IU]/mL (ref 0.450–4.500)

## 2013-03-24 ENCOUNTER — Encounter: Payer: Self-pay | Admitting: Family Medicine

## 2013-03-24 ENCOUNTER — Ambulatory Visit (INDEPENDENT_AMBULATORY_CARE_PROVIDER_SITE_OTHER): Payer: Medicare Other | Admitting: Family Medicine

## 2013-03-24 VITALS — BP 167/79 | HR 67 | Temp 97.9°F | Ht 65.5 in | Wt 160.8 lb

## 2013-03-24 DIAGNOSIS — N951 Menopausal and female climacteric states: Secondary | ICD-10-CM

## 2013-03-24 DIAGNOSIS — I10 Essential (primary) hypertension: Secondary | ICD-10-CM

## 2013-03-24 DIAGNOSIS — E785 Hyperlipidemia, unspecified: Secondary | ICD-10-CM

## 2013-03-24 DIAGNOSIS — Z780 Asymptomatic menopausal state: Secondary | ICD-10-CM

## 2013-03-24 NOTE — Progress Notes (Signed)
Patient ID: Ashley Holland, female   DOB: [birth year], 72 y.o.   MRN: [id number] SUBJECTIVE: CC: Chief Complaint  Patient presents with  . Follow-up    1 week follow up HTN    HPI:  Patient is here for follow up of hypertension: denies Headache;deniesChest Pain;denies weakness;denies Shortness of Breath or Orthopnea;denies Visual changes;denies palpitations;denies cough;denies pedal edema;denies symptoms of TIA or stroke; admits to Compliance with medications. denies Problems with medications. Home BPs run in the 130s to 140s. Stressed today.   Past Medical History  Diagnosis Date  . Unspecified essential hypertension   . Precordial pain   . Other and unspecified hyperlipidemia   . Arthritis     Per medical history form dated 11/04/09.  [name] Rectal prolapse   . Female bladder prolapse   . Malignant neoplasm of breast (female), unspecified site    Past Surgical History  Procedure Laterality Date  . Abdominal hysterectomy    . Cholecystectomy    . Colonoscopy    . Djd    . Breast lumpectomy  2010    Right  . Upper gastrointestinal endoscopy  Sept 2012  . Eus  04/25/2011    Procedure: UPPER ENDOSCOPIC ULTRASOUND (EUS) RADIAL;  Surgeon: Freddy Jaksch, MD;  Location: [facility];  Service: Endoscopy;  Laterality: N/A;   History   Social History  . Marital Status: Married    Spouse Name: N/A    Number of Children: 2  . Years of Education: N/A   Occupational History  . Retired     Futures trader   Social History Main Topics  . Smoking status: Never Smoker   . Smokeless tobacco: Never Used  . Alcohol Use: No  . Drug Use: No  . Sexual Activity: Not Currently   Other Topics Concern  . Not on file   Social History Narrative  . No narrative on file   Family History  Problem Relation Age of Onset  . Coronary artery disease Mother      CABG age 19  . Hyperlipidemia Mother   . Stroke Mother   . Alcohol abuse Father   . Hypertension Father   . Hypertension  Sister   . Anesthesia problems Neg Hx   . Hypotension Neg Hx   . Malignant hyperthermia Neg Hx   . Pseudochol deficiency Neg Hx    Current Outpatient Prescriptions on File Prior to Visit  Medication Sig Dispense Refill  . anastrozole (ARIMIDEX) 1 MG tablet Take 1 mg by mouth daily.        [name] aspirin 81 MG tablet Take 81 mg by mouth daily.        . Calcium Carbonate-Vitamin D (CALCIUM 600+D) 600-200 MG-UNIT TABS Take 1 tablet by mouth daily.        [name] diltiazem (CARDIZEM CD) 300 MG 24 hr capsule Take 1 capsule (300 mg total) by mouth daily.  90 capsule  3  . docusate sodium (COLACE) 100 MG capsule Take 100 mg by mouth 2 (two) times daily.      . Multiple Vitamin (MULTIVITAMIN) tablet Take 1 tablet by mouth daily.        [name] olmesartan-hydrochlorothiazide (BENICAR HCT) 40-12.5 MG per tablet Take 1 tablet by mouth daily.      [name] omeprazole (PRILOSEC) 20 MG capsule Take 20 mg by mouth daily.      . Probiotic Product (TRUBIOTICS) CAPS Take 1 capsule by mouth daily.      . simvastatin (ZOCOR) 20 MG tablet Take  0.5 tablets (10 mg total) by mouth at bedtime.  90 tablet  3   No current facility-administered medications on file prior to visit.   No Known Allergies  There is no immunization history on file for this patient. Prior to Admission medications   Medication Sig Start Date End Date Taking? Authorizing Provider  anastrozole (ARIMIDEX) 1 MG tablet Take 1 mg by mouth daily.      Historical Provider, MD  aspirin 81 MG tablet Take 81 mg by mouth daily.      Historical Provider, MD  Calcium Carbonate-Vitamin D (CALCIUM 600+D) 600-200 MG-UNIT TABS Take 1 tablet by mouth daily.      Historical Provider, MD  diltiazem (CARDIZEM CD) 300 MG 24 hr capsule Take 1 capsule (300 mg total) by mouth daily. 12/11/12   Ileana Ladd, MD  docusate sodium (COLACE) 100 MG capsule Take 100 mg by mouth 2 (two) times daily.    Historical Provider, MD  Multiple Vitamin (MULTIVITAMIN) tablet Take 1 tablet by mouth  daily.      Historical Provider, MD  olmesartan-hydrochlorothiazide (BENICAR HCT) 40-12.5 MG per tablet Take 1 tablet by mouth daily.    Historical Provider, MD  omeprazole (PRILOSEC) 20 MG capsule Take 20 mg by mouth daily.    Historical Provider, MD  Probiotic Product (TRUBIOTICS) CAPS Take 1 capsule by mouth daily.    Historical Provider, MD  simvastatin (ZOCOR) 20 MG tablet Take 0.5 tablets (10 mg total) by mouth at bedtime. 12/11/12   Ileana Ladd, MD     ROS: As above in the HPI. All other systems are stable or negative.  OBJECTIVE: APPEARANCE:  Patient in no acute distress.The patient appeared well nourished and normally developed. Acyanotic. Waist: VITAL SIGNS:BP 167/79  Pulse 67  Temp(Src) 97.9 F (36.6 C) (Oral)  Ht 5' 5.5" (1.664 m)  Wt 160 lb 12.8 oz (72.938 kg)  BMI 26.34 kg/m2 WF  SKIN: warm and  Dry without overt rashes, tattoos and scars  HEAD and Neck: without JVD, Head and scalp: normal Eyes:No scleral icterus. Fundi normal, eye movements normal. Ears: Auricle normal, canal normal, Tympanic membranes normal, insufflation normal. Nose: normal Throat: normal Neck & thyroid: normal  CHEST & LUNGS: Chest wall: normal Lungs: Clear  CVS: Reveals the PMI to be normally located. Regular rhythm, First and Second Heart sounds are normal,  absence of murmurs, rubs or gallops. Peripheral vasculature: Radial pulses: normal Dorsal pedis pulses: normal Posterior pulses: normal  ABDOMEN:  Appearance: normal Benign, no organomegaly, no masses, no Abdominal Aortic enlargement. No Guarding , no rebound. No Bruits. Bowel sounds: normal  RECTAL: N/A GU: N/A  EXTREMETIES: nonedematous.  MUSCULOSKELETAL:  Spine: normal Joints: intact  NEUROLOGIC: oriented to time,place and person; nonfocal. Strength is normal Sensory is normal Reflexes are normal Cranial Nerves are normal.   Results for orders placed in visit on 03/17/13  CMP14+EGFR      Result  Value Range   Glucose 93  65 - 99 mg/dL   BUN 14  8 - 27 mg/dL   Creatinine, Ser 1.61  0.57 - 1.00 mg/dL   GFR calc non Af Amer 70  >59 mL/min/1.73   GFR calc Af Amer 81  >59 mL/min/1.73   BUN/Creatinine Ratio 17  11 - 26   Sodium 142  134 - 144 mmol/L   Potassium 4.4  3.5 - 5.2 mmol/L   Chloride 101  97 - 108 mmol/L   CO2 26  18 - 29 mmol/L  Calcium 9.8  8.6 - 10.2 mg/dL   Total Protein 6.5  6.0 - 8.5 g/dL   Albumin 4.3  3.5 - 4.8 g/dL   Globulin, Total 2.2  1.5 - 4.5 g/dL   Albumin/Globulin Ratio 2.0  1.1 - 2.5   Total Bilirubin 0.4  0.0 - 1.2 mg/dL   Alkaline Phosphatase 97  39 - 117 IU/L   AST 27  0 - 40 IU/L   ALT 12  0 - 32 IU/L  NMR, LIPOPROFILE      Result Value Range   LDL Particle Number [id number] (*) <1000 nmol/L   LDLC SERPL CALC-MCNC 81  <100 mg/dL   HDL Cholesterol by NMR 69  >=40 mg/dL   Triglycerides by NMR 89  <150 mg/dL   Cholesterol 401  <027 mg/dL   HDL Particle Number 25.3  >=66.4 umol/L   Small LDL Particle Number 118  <=527 nmol/L   LDL Size 21.2  >20.5 nm   LP-IR Score < 25  <= 45  TSH      Result Value Range   TSH 1.300  0.450 - 4.500 uIU/mL    ASSESSMENT: HTN (hypertension)  Menopause  Other and unspecified hyperlipidemia  PLAN: Monitor BP daily and bring results in. DASH Diet Continue present medications.  Reviewed labwork.   No orders of the defined types were placed in this encounter.   No orders of the defined types were placed in this encounter.   There are no discontinued medications. Return in about 6 weeks (around 05/05/2013) for recheck BP.  Raydon Chappuis P. [place] Charon, M.D.

## 2013-04-02 ENCOUNTER — Ambulatory Visit: Payer: Medicare Other | Admitting: Family Medicine

## 2013-04-06 ENCOUNTER — Telehealth: Payer: Self-pay | Admitting: Family Medicine

## 2013-04-07 MED ORDER — OLMESARTAN MEDOXOMIL-HCTZ 40-12.5 MG PO TABS: 1.0000 | ORAL_TABLET | Freq: Every day | ORAL | Status: DC

## 2013-04-07 NOTE — Telephone Encounter (Signed)
done

## 2013-05-05 ENCOUNTER — Encounter: Payer: Self-pay | Admitting: Family Medicine

## 2013-05-05 ENCOUNTER — Ambulatory Visit (INDEPENDENT_AMBULATORY_CARE_PROVIDER_SITE_OTHER): Payer: Medicare Other | Admitting: Family Medicine

## 2013-05-05 VITALS — BP 132/77 | HR 70 | Temp 98.0°F | Ht 65.5 in | Wt 160.4 lb

## 2013-05-05 DIAGNOSIS — M129 Arthropathy, unspecified: Secondary | ICD-10-CM | POA: Diagnosis not present

## 2013-05-05 DIAGNOSIS — E785 Hyperlipidemia, unspecified: Secondary | ICD-10-CM

## 2013-05-05 DIAGNOSIS — N951 Menopausal and female climacteric states: Secondary | ICD-10-CM | POA: Diagnosis not present

## 2013-05-05 DIAGNOSIS — I10 Essential (primary) hypertension: Secondary | ICD-10-CM

## 2013-05-05 DIAGNOSIS — Z780 Asymptomatic menopausal state: Secondary | ICD-10-CM

## 2013-05-05 DIAGNOSIS — M1990 Unspecified osteoarthritis, unspecified site: Secondary | ICD-10-CM

## 2013-05-05 DIAGNOSIS — J340 Abscess, furuncle and carbuncle of nose: Secondary | ICD-10-CM

## 2013-05-05 DIAGNOSIS — J3489 Other specified disorders of nose and nasal sinuses: Secondary | ICD-10-CM

## 2013-05-05 DIAGNOSIS — J310 Chronic rhinitis: Secondary | ICD-10-CM

## 2013-05-05 MED ORDER — MUPIROCIN 2 % EX OINT: 1.0000 "application " | TOPICAL_OINTMENT | Freq: Two times a day (BID) | CUTANEOUS | Status: AC

## 2013-05-05 NOTE — Progress Notes (Signed)
Patient ID: Ashley Holland, female   DOB: Mar 17, 1941, 73 y.o.   MRN: [id number] SUBJECTIVE: CC: Chief Complaint  Patient presents with  . Hypertension    recheck    HPI: Patient is here for follow up of hypertension: denies Headache;deniesChest Pain;denies weakness;denies Shortness of Breath or Orthopnea;denies Visual changes;denies palpitations;denies cough;denies pedal edema;denies symptoms of TIA or stroke; admits to Compliance with medications. denies Problems with medications.   Past Medical History  Diagnosis Date  . Unspecified essential hypertension   . Precordial pain   . Other and unspecified hyperlipidemia   . Arthritis     Per medical history form dated 11/04/09.  [name] Rectal prolapse   . Female bladder prolapse   . Malignant neoplasm of breast (female), unspecified site    Past Surgical History  Procedure Laterality Date  . Abdominal hysterectomy    . Cholecystectomy    . Colonoscopy    . Djd    . Breast lumpectomy  2010    Right  . Upper gastrointestinal endoscopy  Sept 2012  . Eus  04/25/2011    Procedure: UPPER ENDOSCOPIC ULTRASOUND (EUS) RADIAL;  Surgeon: Landry Dyke, MD;  Location: [facility];  Service: Endoscopy;  Laterality: N/A;   History   Social History  . Marital Status: Married    Spouse Name: N/A    Number of Children: 2  . Years of Education: N/A   Occupational History  . Retired     Control and instrumentation engineer   Social History Main Topics  . Smoking status: Never Smoker   . Smokeless tobacco: Never Used  . Alcohol Use: No  . Drug Use: No  . Sexual Activity: Not Currently   Other Topics Concern  . Not on file   Social History Narrative  . No narrative on file   Family History  Problem Relation Age of Onset  . Coronary artery disease Mother      CABG age 50  . Hyperlipidemia Mother   . Stroke Mother   . Alcohol abuse Father   . Hypertension Father   . Hypertension Sister   . Anesthesia problems Neg Hx   . Hypotension Neg Hx    . Malignant hyperthermia Neg Hx   . Pseudochol deficiency Neg Hx    Current Outpatient Prescriptions on File Prior to Visit  Medication Sig Dispense Refill  . anastrozole (ARIMIDEX) 1 MG tablet Take 1 mg by mouth daily.        [name] aspirin 81 MG tablet Take 81 mg by mouth daily.        . Calcium Carbonate-Vitamin D (CALCIUM 600+D) 600-200 MG-UNIT TABS Take 1 tablet by mouth daily.        [name] diltiazem (CARDIZEM CD) 300 MG 24 hr capsule Take 1 capsule (300 mg total) by mouth daily.  90 capsule  3  . docusate sodium (COLACE) 100 MG capsule Take 100 mg by mouth 2 (two) times daily.      . Multiple Vitamin (MULTIVITAMIN) tablet Take 1 tablet by mouth daily.        [name] olmesartan-hydrochlorothiazide (BENICAR HCT) 40-12.5 MG per tablet Take 1 tablet by mouth daily.  30 tablet  5  . omeprazole (PRILOSEC) 20 MG capsule Take 20 mg by mouth daily.      . Probiotic Product (TRUBIOTICS) CAPS Take 1 capsule by mouth daily.      . simvastatin (ZOCOR) 20 MG tablet Take 0.5 tablets (10 mg total) by mouth at bedtime.  90 tablet  3  No current facility-administered medications on file prior to visit.   No Known Allergies Immunization History  Administered Date(s) Administered  . Influenza-Unspecified 01/03/2013  . Pneumococcal-Unspecified 04/24/2007   Prior to Admission medications   Medication Sig Start Date End Date Taking? Authorizing Provider  anastrozole (ARIMIDEX) 1 MG tablet Take 1 mg by mouth daily.     Yes Historical Provider, MD  aspirin 81 MG tablet Take 81 mg by mouth daily.     Yes Historical Provider, MD  Calcium Carbonate-Vitamin D (CALCIUM 600+D) 600-200 MG-UNIT TABS Take 1 tablet by mouth daily.     Yes Historical Provider, MD  diltiazem (CARDIZEM CD) 300 MG 24 hr capsule Take 1 capsule (300 mg total) by mouth daily. 12/11/12  Yes Vernie Shanks, MD  docusate sodium (COLACE) 100 MG capsule Take 100 mg by mouth 2 (two) times daily.   Yes Historical Provider, MD  Multiple Vitamin  (MULTIVITAMIN) tablet Take 1 tablet by mouth daily.     Yes Historical Provider, MD  olmesartan-hydrochlorothiazide (BENICAR HCT) 40-12.5 MG per tablet Take 1 tablet by mouth daily. 04/07/13  Yes Vernie Shanks, MD  omeprazole (PRILOSEC) 20 MG capsule Take 20 mg by mouth daily.   Yes Historical Provider, MD  Probiotic Product (TRUBIOTICS) CAPS Take 1 capsule by mouth daily.   Yes Historical Provider, MD  simvastatin (ZOCOR) 20 MG tablet Take 0.5 tablets (10 mg total) by mouth at bedtime. 12/11/12  Yes Vernie Shanks, MD     ROS: As above in the HPI. All other systems are stable or negative.  OBJECTIVE: APPEARANCE:  Patient in no acute distress.The patient appeared well nourished and normally developed. Acyanotic. Waist: VITAL SIGNS:BP 132/77  Pulse 70  Temp(Src) 98 F (36.7 C) (Oral)  Ht 5' 5.5" (1.664 m)  Wt 160 lb 6.4 oz (72.757 kg)  BMI 26.28 kg/m2 WF  SKIN: warm and  Dry without overt rashes, tattoos and scars  HEAD and Neck: without JVD, Head and scalp: normal Eyes:No scleral icterus. Fundi normal, eye movements normal. Ears: Auricle normal, canal normal, Tympanic membranes normal, insufflation normal. Nose: left nares there is a skin break at the roof. Throat: normal Neck & thyroid: normal  CHEST & LUNGS: Chest wall: normal Lungs: Clear  CVS: Reveals the PMI to be normally located. Regular rhythm, First and Second Heart sounds are normal,  absence of murmurs, rubs or gallops. Peripheral vasculature: Radial pulses: normal Dorsal pedis pulses: normal Posterior pulses: normal  ABDOMEN:  Appearance: normal Benign, no organomegaly, no masses, no Abdominal Aortic enlargement. No Guarding , no rebound. No Bruits. Bowel sounds: normal  RECTAL: N/A GU: N/A  EXTREMETIES: nonedematous.  MUSCULOSKELETAL:  Spine: normal Joints: intact  NEUROLOGIC: oriented to time,place and person; nonfocal. Strength is normal Sensory is normal Reflexes are normal Cranial  Nerves are normal.  ASSESSMENT: HTN (hypertension)  Other and unspecified hyperlipidemia  Arthritis/joint pain  Menopause  Rhinitis - Plan: mupirocin ointment (BACTROBAN) 2 %  Nose ulceration - Plan: mupirocin ointment (BACTROBAN) 2 %  PLAN: Humidifier Use the bactroban in the left nostril.  No orders of the defined types were placed in this encounter.   Meds ordered this encounter  Medications  . mupirocin ointment (BACTROBAN) 2 %    Sig: Place 1 application into the nose 2 (two) times daily.    Dispense:  22 g    Refill:  0   There are no discontinued medications. Return in about 2 weeks (around 05/19/2013) for recheck nose. to ensure  that we are not dealing with a skin cancer at the nares.  Ashley Holland P. Jacelyn Grip, M.D.

## 2013-05-14 DIAGNOSIS — M999 Biomechanical lesion, unspecified: Secondary | ICD-10-CM | POA: Diagnosis not present

## 2013-05-14 DIAGNOSIS — M47817 Spondylosis without myelopathy or radiculopathy, lumbosacral region: Secondary | ICD-10-CM | POA: Diagnosis not present

## 2013-05-14 DIAGNOSIS — S139XXA Sprain of joints and ligaments of unspecified parts of neck, initial encounter: Secondary | ICD-10-CM | POA: Diagnosis not present

## 2013-05-14 DIAGNOSIS — M546 Pain in thoracic spine: Secondary | ICD-10-CM | POA: Diagnosis not present

## 2013-05-14 DIAGNOSIS — M9981 Other biomechanical lesions of cervical region: Secondary | ICD-10-CM | POA: Diagnosis not present

## 2013-05-14 DIAGNOSIS — M4712 Other spondylosis with myelopathy, cervical region: Secondary | ICD-10-CM | POA: Diagnosis not present

## 2013-05-19 ENCOUNTER — Ambulatory Visit (INDEPENDENT_AMBULATORY_CARE_PROVIDER_SITE_OTHER): Payer: Medicare Other | Admitting: Family Medicine

## 2013-05-19 ENCOUNTER — Encounter: Payer: Self-pay | Admitting: Family Medicine

## 2013-05-19 VITALS — BP 130/72 | HR 55 | Temp 98.4°F | Ht 65.5 in | Wt 160.8 lb

## 2013-05-19 DIAGNOSIS — J3489 Other specified disorders of nose and nasal sinuses: Secondary | ICD-10-CM | POA: Diagnosis not present

## 2013-05-19 DIAGNOSIS — K219 Gastro-esophageal reflux disease without esophagitis: Secondary | ICD-10-CM | POA: Diagnosis not present

## 2013-05-19 DIAGNOSIS — J340 Abscess, furuncle and carbuncle of nose: Secondary | ICD-10-CM | POA: Insufficient documentation

## 2013-05-19 HISTORY — DX: Gastro-esophageal reflux disease without esophagitis: K21.9

## 2013-05-19 NOTE — Progress Notes (Signed)
Patient ID: Ashley Holland, female   DOB: Nov 05, 1940, 73 y.o.   MRN: [id number] SUBJECTIVE: CC: Chief Complaint  Patient presents with  . Follow-up    reck nose concerned about prilosec and wants to come off it    HPI: 1) here to recheck the nostril. Still has the ulcer there it seems to heal then it opened again. 2) read articles of side effect risks of long term use of prilosec.pneumonia risks etc.  Past Medical History  Diagnosis Date  . Unspecified essential hypertension   . Precordial pain   . Other and unspecified hyperlipidemia   . Arthritis     Per medical history form dated 11/04/09.  [name] Rectal prolapse   . Female bladder prolapse   . Malignant neoplasm of breast (female), unspecified site    Past Surgical History  Procedure Laterality Date  . Abdominal hysterectomy    . Cholecystectomy    . Colonoscopy    . Djd    . Breast lumpectomy  2010    Right  . Upper gastrointestinal endoscopy  Sept 2012  . Eus  04/25/2011    Procedure: UPPER ENDOSCOPIC ULTRASOUND (EUS) RADIAL;  Surgeon: Landry Dyke, MD;  Location: [facility];  Service: Endoscopy;  Laterality: N/A;   History   Social History  . Marital Status: Married    Spouse Name: N/A    Number of Children: 2  . Years of Education: N/A   Occupational History  . Retired     Control and instrumentation engineer   Social History Main Topics  . Smoking status: Never Smoker   . Smokeless tobacco: Never Used  . Alcohol Use: No  . Drug Use: No  . Sexual Activity: Not Currently   Other Topics Concern  . Not on file   Social History Narrative  . No narrative on file   Family History  Problem Relation Age of Onset  . Coronary artery disease Mother      CABG age 37  . Hyperlipidemia Mother   . Stroke Mother   . Alcohol abuse Father   . Hypertension Father   . Hypertension Sister   . Anesthesia problems Neg Hx   . Hypotension Neg Hx   . Malignant hyperthermia Neg Hx   . Pseudochol deficiency Neg Hx    Current  Outpatient Prescriptions on File Prior to Visit  Medication Sig Dispense Refill  . anastrozole (ARIMIDEX) 1 MG tablet Take 1 mg by mouth daily.        [name] aspirin 81 MG tablet Take 81 mg by mouth daily.        . Calcium Carbonate-Vitamin D (CALCIUM 600+D) 600-200 MG-UNIT TABS Take 1 tablet by mouth daily.        [name] diltiazem (CARDIZEM CD) 300 MG 24 hr capsule Take 1 capsule (300 mg total) by mouth daily.  90 capsule  3  . docusate sodium (COLACE) 100 MG capsule Take 100 mg by mouth 2 (two) times daily.      . Multiple Vitamin (MULTIVITAMIN) tablet Take 1 tablet by mouth daily.        [name] olmesartan-hydrochlorothiazide (BENICAR HCT) 40-12.5 MG per tablet Take 1 tablet by mouth daily.  30 tablet  5  . omeprazole (PRILOSEC) 20 MG capsule Take 20 mg by mouth daily.      . Probiotic Product (TRUBIOTICS) CAPS Take 1 capsule by mouth daily.      . simvastatin (ZOCOR) 20 MG tablet Take 0.5 tablets (10 mg total) by mouth at  bedtime.  90 tablet  3   No current facility-administered medications on file prior to visit.   No Known Allergies Immunization History  Administered Date(s) Administered  . Influenza-Unspecified 01/03/2013  . Pneumococcal-Unspecified 04/24/2007   Prior to Admission medications   Medication Sig Start Date End Date Taking? Authorizing Provider  anastrozole (ARIMIDEX) 1 MG tablet Take 1 mg by mouth daily.     Yes Historical Provider, MD  aspirin 81 MG tablet Take 81 mg by mouth daily.     Yes Historical Provider, MD  Calcium Carbonate-Vitamin D (CALCIUM 600+D) 600-200 MG-UNIT TABS Take 1 tablet by mouth daily.     Yes Historical Provider, MD  diltiazem (CARDIZEM CD) 300 MG 24 hr capsule Take 1 capsule (300 mg total) by mouth daily. 12/11/12  Yes Vernie Shanks, MD  docusate sodium (COLACE) 100 MG capsule Take 100 mg by mouth 2 (two) times daily.   Yes Historical Provider, MD  Multiple Vitamin (MULTIVITAMIN) tablet Take 1 tablet by mouth daily.     Yes Historical Provider, MD   mupirocin ointment (BACTROBAN) 2 %  05/05/13  Yes Historical Provider, MD  olmesartan-hydrochlorothiazide (BENICAR HCT) 40-12.5 MG per tablet Take 1 tablet by mouth daily. 04/07/13  Yes Vernie Shanks, MD  omeprazole (PRILOSEC) 20 MG capsule Take 20 mg by mouth daily.   Yes Historical Provider, MD  Probiotic Product (TRUBIOTICS) CAPS Take 1 capsule by mouth daily.   Yes Historical Provider, MD  simvastatin (ZOCOR) 20 MG tablet Take 0.5 tablets (10 mg total) by mouth at bedtime. 12/11/12  Yes Vernie Shanks, MD     ROS: As above in the HPI. All other systems are stable or negative.  OBJECTIVE: APPEARANCE:  Patient in no acute distress.The patient appeared well nourished and normally developed. Acyanotic. Waist: VITAL SIGNS:BP 130/72  Pulse 55  Temp(Src) 98.4 F (36.9 C) (Oral)  Ht 5' 5.5" (1.664 m)  Wt 160 lb 12.8 oz (72.938 kg)  BMI 26.34 kg/m2 WF  SKIN: warm and  Dry without overt rashes, tattoos and scars  HEAD and Neck: without JVD, Head and scalp: normal Eyes:No scleral icterus. Fundi normal, eye movements normal. Ears: Auricle normal, canal normal, Tympanic membranes normal, insufflation normal. Nose: left nostril. Small ulcer at the nares of the left nostril. Throat: normal Neck & thyroid: normal  CHEST & LUNGS: Chest wall: normal Lungs: Clear  CVS: Reveals the PMI to be normally located. Regular rhythm, First and Second Heart sounds are normal,  absence of murmurs, rubs or gallops. Peripheral vasculature: Radial pulses: normal Dorsal pedis pulses: normal Posterior pulses: normal  ABDOMEN:  Appearance: normal Benign, no organomegaly, no masses, no Abdominal Aortic enlargement. No Guarding , no rebound. No Bruits. Bowel sounds: normal  EXTREMETIES: nonedematous.  NEUROLOGIC: oriented to time,place and person; nonfocal.  ASSESSMENT: Nose ulceration - Plan: Ambulatory referral to ENT  GERD (gastroesophageal reflux disease)  PLAN:  Orders Placed This  Encounter  Procedures  . Ambulatory referral to ENT    Referral Priority:  Routine    Referral Type:  Consultation    Referral Reason:  Specialty Services Required    Requested Specialty:  Otolaryngology    Number of Visits Requested:  1   Meds ordered this encounter  Medications  . mupirocin ointment (BACTROBAN) 2 %    Sig:   continue bactroban until seen by ENT  There are no discontinued medications. Return if symptoms worsen or fail to improve.  Jessyka Austria P. Jacelyn Grip, M.D.

## 2013-05-28 DIAGNOSIS — J309 Allergic rhinitis, unspecified: Secondary | ICD-10-CM | POA: Diagnosis not present

## 2013-05-28 DIAGNOSIS — J3489 Other specified disorders of nose and nasal sinuses: Secondary | ICD-10-CM | POA: Diagnosis not present

## 2013-07-07 DIAGNOSIS — Z170 Estrogen receptor positive status [ER+]: Secondary | ICD-10-CM | POA: Diagnosis not present

## 2013-07-07 DIAGNOSIS — C50919 Malignant neoplasm of unspecified site of unspecified female breast: Secondary | ICD-10-CM | POA: Diagnosis not present

## 2013-08-04 ENCOUNTER — Encounter: Payer: Self-pay | Admitting: Family Medicine

## 2013-08-04 ENCOUNTER — Ambulatory Visit (INDEPENDENT_AMBULATORY_CARE_PROVIDER_SITE_OTHER): Payer: Medicare Other | Admitting: Family Medicine

## 2013-08-04 VITALS — BP 134/74 | HR 58 | Temp 97.6°F | Ht 65.5 in | Wt 158.4 lb

## 2013-08-04 DIAGNOSIS — N951 Menopausal and female climacteric states: Secondary | ICD-10-CM

## 2013-08-04 DIAGNOSIS — I10 Essential (primary) hypertension: Secondary | ICD-10-CM | POA: Diagnosis not present

## 2013-08-04 DIAGNOSIS — E785 Hyperlipidemia, unspecified: Secondary | ICD-10-CM

## 2013-08-04 DIAGNOSIS — M129 Arthropathy, unspecified: Secondary | ICD-10-CM

## 2013-08-04 DIAGNOSIS — M1990 Unspecified osteoarthritis, unspecified site: Secondary | ICD-10-CM

## 2013-08-04 DIAGNOSIS — Z780 Asymptomatic menopausal state: Secondary | ICD-10-CM

## 2013-08-04 DIAGNOSIS — C50919 Malignant neoplasm of unspecified site of unspecified female breast: Secondary | ICD-10-CM

## 2013-08-04 DIAGNOSIS — K219 Gastro-esophageal reflux disease without esophagitis: Secondary | ICD-10-CM

## 2013-08-04 DIAGNOSIS — C50911 Malignant neoplasm of unspecified site of right female breast: Secondary | ICD-10-CM

## 2013-08-04 NOTE — Progress Notes (Signed)
Patient ID: Ashley Holland, female   DOB: 09/16/1940, 73 y.o.   MRN: [id number] SUBJECTIVE: CC: Chief Complaint  Patient presents with  . Follow-up    3 MONTH FOLLOW UP    HPI:  Patient is here for follow up of hyperlipidemia/HTNGERD: denies Headache;denies Chest Pain;denies weakness;denies Shortness of Breath and orthopnea;denies Visual changes;denies palpitations;denies cough;denies pedal edema;denies symptoms of TIA or stroke;deniesClaudication symptoms. admits to Compliance with medications; denies Problems with medications. Unable to come off the omeprazole. Every time she stops she gets nauseous.  Past Medical History  Diagnosis Date  . Unspecified essential hypertension   . Precordial pain   . Other and unspecified hyperlipidemia   . Arthritis     Per medical history form dated 11/04/09.  [name] Rectal prolapse   . Female bladder prolapse   . Malignant neoplasm of breast (female), unspecified site    Past Surgical History  Procedure Laterality Date  . Abdominal hysterectomy    . Cholecystectomy    . Colonoscopy    . Djd    . Breast lumpectomy  2010    Right  . Upper gastrointestinal endoscopy  Sept 2012  . Eus  04/25/2011    Procedure: UPPER ENDOSCOPIC ULTRASOUND (EUS) RADIAL;  Surgeon: Landry Dyke, MD;  Location: [facility];  Service: Endoscopy;  Laterality: N/A;   History   Social History  . Marital Status: Married    Spouse Name: N/A    Number of Children: 2  . Years of Education: N/A   Occupational History  . Retired     Control and instrumentation engineer   Social History Main Topics  . Smoking status: Never Smoker   . Smokeless tobacco: Never Used  . Alcohol Use: No  . Drug Use: No  . Sexual Activity: Not Currently   Other Topics Concern  . Not on file   Social History Narrative  . No narrative on file   Family History  Problem Relation Age of Onset  . Coronary artery disease Mother      CABG age 2  . Hyperlipidemia Mother   . Stroke Mother   .  Alcohol abuse Father   . Hypertension Father   . Hypertension Sister   . Anesthesia problems Neg Hx   . Hypotension Neg Hx   . Malignant hyperthermia Neg Hx   . Pseudochol deficiency Neg Hx    Current Outpatient Prescriptions on File Prior to Visit  Medication Sig Dispense Refill  . anastrozole (ARIMIDEX) 1 MG tablet Take 1 mg by mouth daily.        [name] aspirin 81 MG tablet Take 81 mg by mouth daily.        . Calcium Carbonate-Vitamin D (CALCIUM 600+D) 600-200 MG-UNIT TABS Take 1 tablet by mouth daily.        [name] diltiazem (CARDIZEM CD) 300 MG 24 hr capsule Take 1 capsule (300 mg total) by mouth daily.  90 capsule  3  . docusate sodium (COLACE) 100 MG capsule Take 100 mg by mouth 2 (two) times daily.      . Multiple Vitamin (MULTIVITAMIN) tablet Take 1 tablet by mouth daily.        [name] olmesartan-hydrochlorothiazide (BENICAR HCT) 40-12.5 MG per tablet Take 1 tablet by mouth daily.  30 tablet  5  . omeprazole (PRILOSEC) 20 MG capsule Take 20 mg by mouth daily.      . Probiotic Product (TRUBIOTICS) CAPS Take 1 capsule by mouth daily.      . simvastatin (ZOCOR)  20 MG tablet Take 0.5 tablets (10 mg total) by mouth at bedtime.  90 tablet  3  . mupirocin ointment (BACTROBAN) 2 %        No current facility-administered medications on file prior to visit.   No Known Allergies Immunization History  Administered Date(s) Administered  . Influenza-Unspecified 01/03/2013  . Pneumococcal-Unspecified 04/24/2007   Prior to Admission medications   Medication Sig Start Date End Date Taking? Authorizing Provider  anastrozole (ARIMIDEX) 1 MG tablet Take 1 mg by mouth daily.     Yes Historical Provider, MD  aspirin 81 MG tablet Take 81 mg by mouth daily.     Yes Historical Provider, MD  Calcium Carbonate-Vitamin D (CALCIUM 600+D) 600-200 MG-UNIT TABS Take 1 tablet by mouth daily.     Yes Historical Provider, MD  diltiazem (CARDIZEM CD) 300 MG 24 hr capsule Take 1 capsule (300 mg total) by mouth daily.  12/11/12  Yes Vernie Shanks, MD  docusate sodium (COLACE) 100 MG capsule Take 100 mg by mouth 2 (two) times daily.   Yes Historical Provider, MD  Multiple Vitamin (MULTIVITAMIN) tablet Take 1 tablet by mouth daily.     Yes Historical Provider, MD  olmesartan-hydrochlorothiazide (BENICAR HCT) 40-12.5 MG per tablet Take 1 tablet by mouth daily. 04/07/13  Yes Vernie Shanks, MD  omeprazole (PRILOSEC) 20 MG capsule Take 20 mg by mouth daily.   Yes Historical Provider, MD  Probiotic Product (TRUBIOTICS) CAPS Take 1 capsule by mouth daily.   Yes Historical Provider, MD  simvastatin (ZOCOR) 20 MG tablet Take 0.5 tablets (10 mg total) by mouth at bedtime. 12/11/12  Yes Vernie Shanks, MD  ipratropium (ATROVENT) 0.06 % nasal spray  05/28/13   Historical Provider, MD  mupirocin ointment (BACTROBAN) 2 %  05/05/13   Historical Provider, MD     ROS: As above in the HPI. All other systems are stable or negative.  OBJECTIVE: APPEARANCE:  Patient in no acute distress.The patient appeared well nourished and normally developed. Acyanotic. Waist: VITAL SIGNS:BP 134/74  Pulse 58  Temp(Src) 97.6 F (36.4 C) (Oral)  Ht 5' 5.5" (1.664 m)  Wt 158 lb 6.4 oz (71.85 kg)  BMI 25.95 kg/m2  WF looks well  SKIN: warm and  Dry without overt rashes, tattoos and scars  HEAD and Neck: without JVD, Head and scalp: normal Eyes:No scleral icterus. Fundi normal, eye movements normal. Ears: Auricle normal, canal normal, Tympanic membranes normal, insufflation normal. Nose: normal Throat: normal Neck & thyroid: normal  CHEST & LUNGS: Chest wall: normal Lungs: Clear  CVS: Reveals the PMI to be normally located. Regular rhythm, First and Second Heart sounds are normal,  absence of murmurs, rubs or gallops. Peripheral vasculature: Radial pulses: normal Dorsal pedis pulses: normal Posterior pulses: normal  ABDOMEN:  Appearance: normal Benign, no organomegaly, no masses, no Abdominal Aortic enlargement. No  Guarding , no rebound. No Bruits. Bowel sounds: normal  RECTAL: N/A GU: N/A  EXTREMETIES: nonedematous.  MUSCULOSKELETAL:  Spine: normal Joints: intact  NEUROLOGIC: oriented to time,place and person; nonfocal. Strength is normal Sensory is normal Reflexes are normal Cranial Nerves are normal. Results for orders placed in visit on 03/17/13  CMP14+EGFR      Result Value Ref Range   Glucose 93  65 - 99 mg/dL   BUN 14  8 - 27 mg/dL   Creatinine, Ser 0.83  0.57 - 1.00 mg/dL   GFR calc non Af Amer 70  >59 mL/min/1.73   GFR calc Af  Amer 81  >59 mL/min/1.73   BUN/Creatinine Ratio 17  11 - 26   Sodium 142  134 - 144 mmol/L   Potassium 4.4  3.5 - 5.2 mmol/L   Chloride 101  97 - 108 mmol/L   CO2 26  18 - 29 mmol/L   Calcium 9.8  8.6 - 10.2 mg/dL   Total Protein 6.5  6.0 - 8.5 g/dL   Albumin 4.3  3.5 - 4.8 g/dL   Globulin, Total 2.2  1.5 - 4.5 g/dL   Albumin/Globulin Ratio 2.0  1.1 - 2.5   Total Bilirubin 0.4  0.0 - 1.2 mg/dL   Alkaline Phosphatase 97  39 - 117 IU/L   AST 27  0 - 40 IU/L   ALT 12  0 - 32 IU/L  NMR, LIPOPROFILE      Result Value Ref Range   LDL Particle Number 1133 (*) <1000 nmol/L   LDLC SERPL CALC-MCNC 81  <100 mg/dL   HDL Cholesterol by NMR 69  >=40 mg/dL   Triglycerides by NMR 89  <150 mg/dL   Cholesterol 168  <200 mg/dL   HDL Particle Number 41.7  >=30.5 umol/L   Small LDL Particle Number 118  <=527 nmol/L   LDL Size 21.2  >20.5 nm   LP-IR Score < 25  <= 45  TSH      Result Value Ref Range   TSH 1.300  0.450 - 4.500 uIU/mL    ASSESSMENT: HTN (hypertension)  Menopause  Cancer of right breast  Arthritis/joint pain  GERD (gastroesophageal reflux disease)  Other and unspecified hyperlipidemia - Plan: Hepatic function panel, Lipid panel  PLAN: Continue present medications and keep active and healthy diet. Labs were mainly done a few days ago at the oncology office  Except the lipids.  Orders Placed This Encounter  Procedures  . Hepatic  function panel    Standing Status: Future     Number of Occurrences:      Standing Expiration Date: 08/05/2014  . Lipid panel    Standing Status: Future     Number of Occurrences:      Standing Expiration Date: 08/05/2014    Order Specific Question:  Has the patient fasted?    Answer:  Yes   Meds ordered this encounter  Medications  . ipratropium (ATROVENT) 0.06 % nasal spray    Sig:    There are no discontinued medications. Return in about 4 months (around 12/04/2013) for Recheck medical problems.  Sallyann Kinnaird P. Jacelyn Grip, M.D.

## 2013-08-17 DIAGNOSIS — S139XXA Sprain of joints and ligaments of unspecified parts of neck, initial encounter: Secondary | ICD-10-CM | POA: Diagnosis not present

## 2013-08-17 DIAGNOSIS — M546 Pain in thoracic spine: Secondary | ICD-10-CM | POA: Diagnosis not present

## 2013-08-17 DIAGNOSIS — M9981 Other biomechanical lesions of cervical region: Secondary | ICD-10-CM | POA: Diagnosis not present

## 2013-08-17 DIAGNOSIS — M999 Biomechanical lesion, unspecified: Secondary | ICD-10-CM | POA: Diagnosis not present

## 2013-08-17 DIAGNOSIS — M4712 Other spondylosis with myelopathy, cervical region: Secondary | ICD-10-CM | POA: Diagnosis not present

## 2013-08-17 DIAGNOSIS — M47817 Spondylosis without myelopathy or radiculopathy, lumbosacral region: Secondary | ICD-10-CM | POA: Diagnosis not present

## 2013-08-18 ENCOUNTER — Other Ambulatory Visit (INDEPENDENT_AMBULATORY_CARE_PROVIDER_SITE_OTHER): Payer: Medicare Other

## 2013-08-18 DIAGNOSIS — E785 Hyperlipidemia, unspecified: Secondary | ICD-10-CM | POA: Diagnosis not present

## 2013-08-18 NOTE — Progress Notes (Signed)
Patient came in for labs only.

## 2013-08-19 LAB — HEPATIC FUNCTION PANEL
ALT: 13 IU/L (ref 0–32)
AST: 27 IU/L (ref 0–40)
Albumin: 3.9 g/dL (ref 3.5–4.8)
Alkaline Phosphatase: 90 IU/L (ref 39–117)
Bilirubin, Direct: 0.16 mg/dL (ref 0.00–0.40)
Total Bilirubin: 0.6 mg/dL (ref 0.0–1.2)
Total Protein: 6.2 g/dL (ref 6.0–8.5)

## 2013-08-19 LAB — LIPID PANEL
Chol/HDL Ratio: 2.6 ratio units (ref 0.0–4.4)
Cholesterol, Total: 156 mg/dL (ref 100–199)
HDL: 60 mg/dL (ref >39)
LDL Calculated: 81 mg/dL (ref 0–99)
Triglycerides: 76 mg/dL (ref 0–149)
VLDL Cholesterol Cal: 15 mg/dL (ref 5–40)

## 2013-09-29 DIAGNOSIS — Z09 Encounter for follow-up examination after completed treatment for conditions other than malignant neoplasm: Secondary | ICD-10-CM | POA: Diagnosis not present

## 2013-09-29 DIAGNOSIS — Z853 Personal history of malignant neoplasm of breast: Secondary | ICD-10-CM | POA: Diagnosis not present

## 2013-09-30 ENCOUNTER — Other Ambulatory Visit: Payer: Self-pay | Admitting: Radiology

## 2013-09-30 DIAGNOSIS — N641 Fat necrosis of breast: Secondary | ICD-10-CM | POA: Diagnosis not present

## 2013-09-30 DIAGNOSIS — N63 Unspecified lump in unspecified breast: Secondary | ICD-10-CM | POA: Diagnosis not present

## 2013-09-30 DIAGNOSIS — Z853 Personal history of malignant neoplasm of breast: Secondary | ICD-10-CM | POA: Diagnosis not present

## 2013-10-12 DIAGNOSIS — M9981 Other biomechanical lesions of cervical region: Secondary | ICD-10-CM | POA: Diagnosis not present

## 2013-10-12 DIAGNOSIS — S139XXA Sprain of joints and ligaments of unspecified parts of neck, initial encounter: Secondary | ICD-10-CM | POA: Diagnosis not present

## 2013-10-12 DIAGNOSIS — M47817 Spondylosis without myelopathy or radiculopathy, lumbosacral region: Secondary | ICD-10-CM | POA: Diagnosis not present

## 2013-10-12 DIAGNOSIS — M546 Pain in thoracic spine: Secondary | ICD-10-CM | POA: Diagnosis not present

## 2013-10-12 DIAGNOSIS — M4712 Other spondylosis with myelopathy, cervical region: Secondary | ICD-10-CM | POA: Diagnosis not present

## 2013-10-12 DIAGNOSIS — M999 Biomechanical lesion, unspecified: Secondary | ICD-10-CM | POA: Diagnosis not present

## 2013-10-15 ENCOUNTER — Other Ambulatory Visit: Payer: Self-pay | Admitting: *Deleted

## 2013-10-15 MED ORDER — OLMESARTAN MEDOXOMIL-HCTZ 40-12.5 MG PO TABS
1.0000 | ORAL_TABLET | Freq: Every day | ORAL | Status: DC
Start: 2013-10-15 — End: 2013-10-16

## 2013-10-16 ENCOUNTER — Ambulatory Visit (INDEPENDENT_AMBULATORY_CARE_PROVIDER_SITE_OTHER): Payer: Medicare Other | Admitting: Physician Assistant

## 2013-10-16 ENCOUNTER — Encounter: Payer: Self-pay | Admitting: Physician Assistant

## 2013-10-16 ENCOUNTER — Ambulatory Visit (INDEPENDENT_AMBULATORY_CARE_PROVIDER_SITE_OTHER): Payer: Medicare Other

## 2013-10-16 VITALS — BP 128/70 | HR 69 | Temp 98.9°F | Ht 65.5 in | Wt 152.0 lb

## 2013-10-16 DIAGNOSIS — M25559 Pain in unspecified hip: Secondary | ICD-10-CM

## 2013-10-16 DIAGNOSIS — M25551 Pain in right hip: Secondary | ICD-10-CM

## 2013-10-16 MED ORDER — OLMESARTAN MEDOXOMIL-HCTZ 40-12.5 MG PO TABS: 1.0000 | ORAL_TABLET | Freq: Every day | ORAL | Status: DC

## 2013-10-16 NOTE — Progress Notes (Signed)
Subjective:     Patient ID: Ashley Holland, female   DOB: 27-Jul-1940, 73 y.o.   MRN: [id number]  HPI Pt with R hip pain Denies trauma Pain to the R ing area Certain positions make it worse Pt still walking 2-3 miles daily  Review of Systems  Musculoskeletal: Positive for arthralgias and myalgias. Negative for gait problem and joint swelling.       Objective:   Physical Exam NAD No sx with ant adb/adduction + Sx with lateral abd/adduction and ext rotation No TTP of the lateral hip sl sx along ant Good strength distal Xray- loss of joint space with degen changes    Assessment:     Hip  pain    Plan:     Discussed finding with pt today She is very active and would like her to remain that way OTC NSAIDS If sx cont refer to Ortho Pt was also to have BP med rf at last appt so will do today

## 2013-10-16 NOTE — Patient Instructions (Signed)
Arthritis, Nonspecific °Arthritis is inflammation of a joint. This usually means pain, redness, warmth or swelling are present. One or more joints may be involved. There are a number of types of arthritis. Your caregiver may not be able to tell what type of arthritis you have right away. °CAUSES  °The most common cause of arthritis is the wear and tear on the joint (osteoarthritis). This causes damage to the cartilage, which can break down over time. The knees, hips, back and neck are most often affected by this type of arthritis. °Other types of arthritis and common causes of joint pain include: °· Sprains and other injuries near the joint. Sometimes minor sprains and injuries cause pain and swelling that develop hours later. °· Rheumatoid arthritis. This affects hands, feet and knees. It usually affects both sides of your body at the same time. It is often associated with chronic ailments, fever, weight loss and general weakness. °· Crystal arthritis. Gout and pseudo gout can cause occasional acute severe pain, redness and swelling in the foot, ankle, or knee. °· Infectious arthritis. Bacteria can get into a joint through a break in overlying skin. This can cause infection of the joint. Bacteria and viruses can also spread through the blood and affect your joints. °· Drug, infectious and allergy reactions. Sometimes joints can become mildly painful and slightly swollen with these types of illnesses. °SYMPTOMS  °· Pain is the main symptom. °· Your joint or joints can also be red, swollen and warm or hot to the touch. °· You may have a fever with certain types of arthritis, or even feel overall ill. °· The joint with arthritis will hurt with movement. Stiffness is present with some types of arthritis. °DIAGNOSIS  °Your caregiver will suspect arthritis based on your description of your symptoms and on your exam. Testing may be needed to find the type of arthritis: °· Blood and sometimes urine tests. °· X-ray tests  and sometimes CT or MRI scans. °· Removal of fluid from the joint (arthrocentesis) is done to check for bacteria, crystals or other causes. Your caregiver (or a specialist) will numb the area over the joint with a local anesthetic, and use a needle to remove joint fluid for examination. This procedure is only minimally uncomfortable. °· Even with these tests, your caregiver may not be able to tell what kind of arthritis you have. Consultation with a specialist (rheumatologist) may be helpful. °TREATMENT  °Your caregiver will discuss with you treatment specific to your type of arthritis. If the specific type cannot be determined, then the following general recommendations may apply. °Treatment of severe joint pain includes: °· Rest. °· Elevation. °· Anti-inflammatory medication (for example, ibuprofen) may be prescribed. Avoiding activities that cause increased pain. °· Only take over-the-counter or prescription medicines for pain and discomfort as recommended by your caregiver. °· Cold packs over an inflamed joint may be used for 10 to 15 minutes every hour. Hot packs sometimes feel better, but do not use overnight. Do not use hot packs if you are diabetic without your caregiver's permission. °· A cortisone shot into arthritic joints may help reduce pain and swelling. °· Any acute arthritis that gets worse over the next 1 to 2 days needs to be looked at to be sure there is no joint infection. °Long-term arthritis treatment involves modifying activities and lifestyle to reduce joint stress jarring. This can include weight loss. Also, exercise is needed to nourish the joint cartilage and remove waste. This helps keep the muscles   around the joint strong. °HOME CARE INSTRUCTIONS  °· Do not take aspirin to relieve pain if gout is suspected. This elevates uric acid levels. °· Only take over-the-counter or prescription medicines for pain, discomfort or fever as directed by your caregiver. °· Rest the joint as much as  possible. °· If your joint is swollen, keep it elevated. °· Use crutches if the painful joint is in your leg. °· Drinking plenty of fluids may help for certain types of arthritis. °· Follow your caregiver's dietary instructions. °· Try low-impact exercise such as: °¨ Swimming. °¨ Water aerobics. °¨ Biking. °¨ Walking. °· Morning stiffness is often relieved by a warm shower. °· Put your joints through regular range-of-motion. °SEEK MEDICAL CARE IF:  °· You do not feel better in 24 hours or are getting worse. °· You have side effects to medications, or are not getting better with treatment. °SEEK IMMEDIATE MEDICAL CARE IF:  °· You have a fever. °· You develop severe joint pain, swelling or redness. °· Many joints are involved and become painful and swollen. °· There is severe back pain and/or leg weakness. °· You have loss of bowel or bladder control. °Document Released: 05/17/2004 Document Revised: 07/02/2011 Document Reviewed: 06/02/2008 °ExitCare® Patient Information ©2015 [employer]. This information is not intended to replace advice given to you by your health care provider. Make sure you discuss any questions you have with your health care provider. ° °

## 2013-10-29 ENCOUNTER — Ambulatory Visit: Payer: Medicare Other | Admitting: Family Medicine

## 2013-11-02 ENCOUNTER — Ambulatory Visit (INDEPENDENT_AMBULATORY_CARE_PROVIDER_SITE_OTHER): Payer: Medicare Other | Admitting: Nurse Practitioner

## 2013-11-02 ENCOUNTER — Encounter: Payer: Self-pay | Admitting: Nurse Practitioner

## 2013-11-02 ENCOUNTER — Telehealth: Payer: Self-pay | Admitting: Family Medicine

## 2013-11-02 VITALS — BP 118/80 | HR 73 | Temp 98.9°F | Ht 65.5 in | Wt 150.8 lb

## 2013-11-02 DIAGNOSIS — I9589 Other hypotension: Secondary | ICD-10-CM

## 2013-11-02 DIAGNOSIS — I10 Essential (primary) hypertension: Secondary | ICD-10-CM

## 2013-11-02 DIAGNOSIS — I952 Hypotension due to drugs: Secondary | ICD-10-CM

## 2013-11-02 NOTE — Telephone Encounter (Signed)
appt given for today with mmm to evaluate bp

## 2013-11-02 NOTE — Progress Notes (Signed)
   Subjective:    Patient ID: Ashley Holland, female    DOB: 04/11/1941, 73 y.o.   MRN: [id number]  HPI Patient in c/o low blood pressure readings- she is currently on benicar 40-12.5 and diltiazem 300mg - SHe was taken off of benicar 40/12.5 and blood pressure started creeping back up and [facility] both [facility] these meds for a long time. Over the last 2 days she has stopped taking her diltiazem and just taking benicar/HCTZ and she says she feels much better.   Review of Systems  Constitutional: Negative.   HENT: Negative.   Respiratory: Negative.   Cardiovascular: Negative.   Genitourinary: Negative.   Psychiatric/Behavioral: Negative.   All other systems reviewed and are negative.      Objective:   Physical Exam  Constitutional: She is oriented to person, place, and time. She appears well-developed and well-nourished.  Cardiovascular: Normal rate, regular rhythm and normal heart sounds.   Pulmonary/Chest: Effort normal and breath sounds normal.  Neurological: She is alert and oriented to person, place, and time.  Skin: Skin is warm.  Psychiatric: She has a normal mood and affect. Her behavior is normal. Judgment and thought content normal.   BP 118/80  Pulse 73  Temp(Src) 98.9 F (37.2 C) (Oral)  Ht 5' 5.5" (1.664 m)  Wt 150 lb 12.8 oz (68.402 kg)  BMI 24.70 kg/m2        Assessment & Plan:   1. Essential hypertension   2. Hypotension due to drugs    Continue to hold diltiazem Continue benicar/HCTZ as rx Keep diary of blood pressure readings at home and bring to appointment with Dr. Lockie Pares, FNP

## 2013-11-02 NOTE — Patient Instructions (Signed)
Hypertension Hypertension, commonly called high blood pressure, is when the force of blood pumping through your arteries is too strong. Your arteries are the blood vessels that carry blood from your heart throughout your body. A blood pressure reading consists of a higher number over a lower number, such as 110/72. The higher number (systolic) is the pressure inside your arteries when your heart pumps. The lower number (diastolic) is the pressure inside your arteries when your heart relaxes. Ideally you want your blood pressure below 120/80. Hypertension forces your heart to work harder to pump blood. Your arteries may become narrow or stiff. Having hypertension puts you at risk for heart disease, stroke, and other problems.  RISK FACTORS Some risk factors for high blood pressure are controllable. Others are not.  Risk factors you cannot control include:   Race. You may be at higher risk if you are African American.  Age. Risk increases with age.  Gender. Men are at higher risk than women before age 45 years. After age 65, women are at higher risk than men. Risk factors you can control include:  Not getting enough exercise or physical activity.  Being overweight.  Getting too much fat, sugar, calories, or salt in your diet.  Drinking too much alcohol. SIGNS AND SYMPTOMS Hypertension does not usually cause signs or symptoms. Extremely high blood pressure (hypertensive crisis) may cause headache, anxiety, shortness of breath, and nosebleed. DIAGNOSIS  To check if you have hypertension, your health care provider will measure your blood pressure while you are seated, with your arm held at the level of your heart. It should be measured at least twice using the same arm. Certain conditions can cause a difference in blood pressure between your right and left arms. A blood pressure reading that is higher than normal on one occasion does not mean that you need treatment. If one blood pressure reading  is high, ask your health care provider about having it checked again. TREATMENT  Treating high blood pressure includes making lifestyle changes and possibly taking medication. Living a healthy lifestyle can help lower high blood pressure. You may need to change some of your habits. Lifestyle changes may include:  Following the DASH diet. This diet is high in fruits, vegetables, and whole grains. It is low in salt, red meat, and added sugars.  Getting at least 2 1/2 hours of brisk physical activity every week.  Losing weight if necessary.  Not smoking.  Limiting alcoholic beverages.  Learning ways to reduce stress. If lifestyle changes are not enough to get your blood pressure under control, your health care provider may prescribe medicine. You may need to take more than one. Work closely with your health care provider to understand the risks and benefits. HOME CARE INSTRUCTIONS  Have your blood pressure rechecked as directed by your health care provider.   Only take medicine as directed by your health care provider. Follow the directions carefully. Blood pressure medicines must be taken as prescribed. The medicine does not work as well when you skip doses. Skipping doses also puts you at risk for problems.   Do not smoke.   Monitor your blood pressure at home as directed by your health care provider. SEEK MEDICAL CARE IF:   You think you are having a reaction to medicines taken.  You have recurrent headaches or feel dizzy.  You have swelling in your ankles.  You have trouble with your vision. SEEK IMMEDIATE MEDICAL CARE IF:  You develop a severe headache or   confusion.  You have unusual weakness, numbness, or feel faint.  You have severe chest or abdominal pain.  You vomit repeatedly.  You have trouble breathing. MAKE SURE YOU:   Understand these instructions.  Will watch your condition.  Will get help right away if you are not doing well or get  worse. Document Released: 04/09/2005 Document Revised: 04/14/2013 Document Reviewed: 01/30/2013 ExitCare Patient Information 2015 [employer]. This information is not intended to replace advice given to you by your health care provider. Make sure you discuss any questions you have with your health care provider.  

## 2013-11-17 ENCOUNTER — Ambulatory Visit (INDEPENDENT_AMBULATORY_CARE_PROVIDER_SITE_OTHER): Payer: Medicare Other | Admitting: Family Medicine

## 2013-11-17 ENCOUNTER — Encounter: Payer: Self-pay | Admitting: Family Medicine

## 2013-11-17 VITALS — BP 109/68 | HR 64 | Temp 98.5°F | Ht 65.5 in | Wt 149.0 lb

## 2013-11-17 DIAGNOSIS — I10 Essential (primary) hypertension: Secondary | ICD-10-CM

## 2013-11-17 NOTE — Progress Notes (Signed)
   Subjective:    Patient ID: Ashley Holland, female    DOB: Dec 04, 1940, 73 y.o.   MRN: [id number]  HPI 73 year old female with hypertension. Had recently been on diltiazem and and Benicar the left off the diltiazem as her blood pressure was too low. Since the admission of the diltiazem her pressure has been good and she's had more energy. She also has had some GI symptoms, not really pain or reflux but nausea that seems to respond to Prilosec. She is concerned about the use of prosthetic. She has lost some weight recently when she went off the Prilosec for now she's added back the weight seems to be stabilized.    Review of Systems  Constitutional: Positive for unexpected weight change.  HENT: Negative.   Respiratory: Negative.   Cardiovascular: Negative.   Gastrointestinal: Positive for nausea.       Objective:   Physical Exam  Constitutional: She is oriented to person, place, and time. She appears well-developed and well-nourished.  Eyes: Conjunctivae and EOM are normal.  Neck: Normal range of motion. Neck supple.  Cardiovascular: Normal rate, regular rhythm and normal heart sounds.   Pulmonary/Chest: Effort normal and breath sounds normal.  Abdominal: Soft. Bowel sounds are normal.  Musculoskeletal: Normal range of motion.  Neurological: She is alert and oriented to person, place, and time. She has normal reflexes.  Skin: Skin is warm and dry.  Psychiatric: She has a normal mood and affect. Her behavior is normal. Thought content normal.          Assessment & Plan:  The encounter diagnosis was Essential hypertension.  continued on just the Benicar hydrochlorothiazide. Continue to monitor blood pressure and let us know if there are significant changes. She is up-to-date on labs, specifically lipids and will continue on Zocor 10 mg.  Wardell Honour MD

## 2013-11-27 ENCOUNTER — Telehealth: Payer: Self-pay | Admitting: *Deleted

## 2013-11-27 MED ORDER — LOSARTAN POTASSIUM 50 MG PO TABS: 50.0000 mg | ORAL_TABLET | Freq: Every day | ORAL | Status: DC

## 2013-11-27 NOTE — Telephone Encounter (Signed)
Benicar was too expensive and savings card did not apply since she has Medicare. Dr Sabra Heck changed to Losartan 50mg . Script sent to pharmacy.

## 2013-12-09 DIAGNOSIS — M546 Pain in thoracic spine: Secondary | ICD-10-CM | POA: Diagnosis not present

## 2013-12-09 DIAGNOSIS — M4712 Other spondylosis with myelopathy, cervical region: Secondary | ICD-10-CM | POA: Diagnosis not present

## 2013-12-09 DIAGNOSIS — M9981 Other biomechanical lesions of cervical region: Secondary | ICD-10-CM | POA: Diagnosis not present

## 2013-12-09 DIAGNOSIS — M999 Biomechanical lesion, unspecified: Secondary | ICD-10-CM | POA: Diagnosis not present

## 2013-12-09 DIAGNOSIS — M47817 Spondylosis without myelopathy or radiculopathy, lumbosacral region: Secondary | ICD-10-CM | POA: Diagnosis not present

## 2013-12-09 DIAGNOSIS — S139XXA Sprain of joints and ligaments of unspecified parts of neck, initial encounter: Secondary | ICD-10-CM | POA: Diagnosis not present

## 2013-12-10 ENCOUNTER — Encounter (INDEPENDENT_AMBULATORY_CARE_PROVIDER_SITE_OTHER): Payer: Self-pay | Admitting: General Surgery

## 2013-12-10 ENCOUNTER — Ambulatory Visit (INDEPENDENT_AMBULATORY_CARE_PROVIDER_SITE_OTHER): Payer: Medicare Other | Admitting: General Surgery

## 2013-12-10 VITALS — BP 120/80 | HR 56 | Temp 97.7°F | Ht 65.0 in | Wt 150.0 lb

## 2013-12-10 DIAGNOSIS — C50919 Malignant neoplasm of unspecified site of unspecified female breast: Secondary | ICD-10-CM

## 2013-12-10 DIAGNOSIS — C50911 Malignant neoplasm of unspecified site of right female breast: Secondary | ICD-10-CM

## 2013-12-10 NOTE — Patient Instructions (Signed)
Examination of both breasts and all the regional lymph nodes is normal. Your recent right breast biopsy shows benign fat necrosis. There is no evidence of cancer.  Continue to take the anastrozole medication  Continue regular followup with Dr. Tressie Stalker  Return to see Dr. Dalbert Batman in one year after you get your bilateral mammograms

## 2013-12-10 NOTE — Progress Notes (Signed)
Patient ID: Ashley Holland, female   DOB: 1940/10/10, 73 y.o.   MRN: [id number]  History:  Ashley Holland is a 73 y.o. female. She returns for long-term followup of her right breast cancer.  On 11/16/2009 this patient underwent right partial mastectomy and sentinel node biopsy and reexcision of superior margins. Pathology showed invasive ductal carcinoma, receptor positive, HER-2 negative, proliferation index 18%, 1.8 cm tumor, negative margins, stage T1c., N0. She completed radiation therapy under the supervision of Dr. Thea Silversmith.  She was being followed by Dr. Audree Camel, She is now being followed by Dr. Everardo All. Mammograms in June thought there was a mass in the lumpectomy site on the right. She had a biopsy shows benign fat necrosis. Six-month followup is planned. She is on arimidex and tolerating that well.   Past history, family history, social history, and review of systems are documemnted on the  chart, unchanged, and noncontributory except as described above. She does think that she is going to need a hip replacement.    Exam:  General: Patient looks good. Very fit for her age. No distress.  Neck: No adenopathy or mass  Lungs: Clear to auscultation bilaterally  Heart: Regular rate and rhythm. No murmur. No ectopy  Breasts: Right breast reveals well-healed lumpectomy scar la  Assessment:  Invasive ductal carcinoma right breast, pathologic stage TI C., N0, receptor positive, HER-2 negative, proliferation index 18%  No evidence of recurrence 4 years following right partial mastectomy and sentinel load biopsy and adjuvant radiation therapy and ongoing adjuvant arimidex.  Plan: Continue arimadex  Continue followup with Dr. Tressie Stalker Repeat mammograms in one year Return to see me in one year     Ashley Holland, M.D., [facility]  General and Minimally invasive Surgery  Breast and Colorectal Surgery  Office: [phone]  Pager:  [phone]

## 2013-12-23 ENCOUNTER — Other Ambulatory Visit: Payer: Self-pay | Admitting: *Deleted

## 2013-12-23 MED ORDER — LOSARTAN POTASSIUM 50 MG PO TABS: 50.0000 mg | ORAL_TABLET | Freq: Every day | ORAL | Status: DC

## 2013-12-23 NOTE — Telephone Encounter (Signed)
Patient is doing well on Losartan and would like refills.  Dr Sabra Heck approved these and refills sent to [facility].

## 2014-01-01 ENCOUNTER — Telehealth: Payer: Self-pay | Admitting: Family Medicine

## 2014-01-01 ENCOUNTER — Other Ambulatory Visit: Payer: Self-pay | Admitting: Nurse Practitioner

## 2014-01-01 MED ORDER — LOSARTAN POTASSIUM-HCTZ 50-12.5 MG PO TABS: 1.0000 | ORAL_TABLET | Freq: Every day | ORAL | Status: DC

## 2014-01-01 NOTE — Telephone Encounter (Signed)
Changed meds to losartan 50/12.5- rx sent to pharmacy

## 2014-01-01 NOTE — Telephone Encounter (Signed)
Patient aware.  She will continue to monitor blood pressure and notify us of readings.

## 2014-01-01 NOTE — Telephone Encounter (Signed)
Patient states that BP meds were recently changed due to cost. Unsure that they are as effective. This am her BP at 6am was 153/99, at 8am it was 149/96 at which time she took her new BP meds and at 10am it was 131/87. Should she go back to taking old med which was very effective. Please advise

## 2014-01-01 NOTE — Telephone Encounter (Signed)
Benicar was 40/12.5.  Would you like to add HCTZ 12.5 to the losartan?

## 2014-01-04 DIAGNOSIS — C50919 Malignant neoplasm of unspecified site of unspecified female breast: Secondary | ICD-10-CM | POA: Diagnosis not present

## 2014-01-04 DIAGNOSIS — E049 Nontoxic goiter, unspecified: Secondary | ICD-10-CM | POA: Diagnosis not present

## 2014-01-04 DIAGNOSIS — Z170 Estrogen receptor positive status [ER+]: Secondary | ICD-10-CM | POA: Diagnosis not present

## 2014-01-08 DIAGNOSIS — E049 Nontoxic goiter, unspecified: Secondary | ICD-10-CM | POA: Diagnosis not present

## 2014-01-08 DIAGNOSIS — E042 Nontoxic multinodular goiter: Secondary | ICD-10-CM | POA: Diagnosis not present

## 2014-02-08 ENCOUNTER — Other Ambulatory Visit: Payer: Self-pay | Admitting: *Deleted

## 2014-02-12 ENCOUNTER — Other Ambulatory Visit: Payer: Self-pay

## 2014-02-12 MED ORDER — SIMVASTATIN 20 MG PO TABS: 10.0000 mg | ORAL_TABLET | Freq: Every day | ORAL | Status: DC

## 2014-02-12 NOTE — Telephone Encounter (Signed)
Last seen 11/17/13 Dr Sabra Heck last lipid 08/18/13

## 2014-02-13 ENCOUNTER — Telehealth: Payer: Self-pay | Admitting: *Deleted

## 2014-02-13 ENCOUNTER — Other Ambulatory Visit: Payer: Self-pay | Admitting: *Deleted

## 2014-02-13 NOTE — Telephone Encounter (Signed)
Simvastatin needs to be sent in to [facility]  Per patient. Cancelled call in at [facility].

## 2014-02-17 ENCOUNTER — Telehealth: Payer: Self-pay | Admitting: Family Medicine

## 2014-02-17 ENCOUNTER — Other Ambulatory Visit: Payer: Self-pay | Admitting: *Deleted

## 2014-02-17 MED ORDER — SIMVASTATIN 20 MG PO TABS: 10.0000 mg | ORAL_TABLET | Freq: Every day | ORAL | Status: DC

## 2014-02-17 NOTE — Telephone Encounter (Signed)
Was suppose to be mail order and was printed. Pt came to office and no Rx available. Will send to mail order per pt request.

## 2014-02-18 ENCOUNTER — Telehealth: Payer: Self-pay | Admitting: *Deleted

## 2014-02-18 NOTE — Telephone Encounter (Signed)
Left message.   Unsure if I chose correct [facility] mail order address.   Need correct address or pick up script at front desk.

## 2014-02-18 NOTE — Telephone Encounter (Signed)
She brought form form [facility] with refill request and wants to make sure this is done.  Call pt about this

## 2014-02-19 DIAGNOSIS — E041 Nontoxic single thyroid nodule: Secondary | ICD-10-CM | POA: Diagnosis not present

## 2014-02-19 DIAGNOSIS — K5909 Other constipation: Secondary | ICD-10-CM | POA: Diagnosis not present

## 2014-02-19 DIAGNOSIS — Z808 Family history of malignant neoplasm of other organs or systems: Secondary | ICD-10-CM | POA: Diagnosis not present

## 2014-02-19 DIAGNOSIS — C50911 Malignant neoplasm of unspecified site of right female breast: Secondary | ICD-10-CM | POA: Diagnosis not present

## 2014-02-19 DIAGNOSIS — E063 Autoimmune thyroiditis: Secondary | ICD-10-CM | POA: Diagnosis not present

## 2014-02-19 DIAGNOSIS — E042 Nontoxic multinodular goiter: Secondary | ICD-10-CM | POA: Diagnosis not present

## 2014-02-20 ENCOUNTER — Telehealth: Payer: Self-pay | Admitting: Family Medicine

## 2014-02-20 NOTE — Telephone Encounter (Signed)
Patient has been taking Losartan/hctz and she states her BP is going back up and causing headaches. She wants to know if we can switch her back to the benicar/hctz she was on before. She states that she has 2 weeks of this at home and wants to know if she can go back on the benicar

## 2014-02-21 ENCOUNTER — Other Ambulatory Visit: Payer: Self-pay | Admitting: Nurse Practitioner

## 2014-02-21 DIAGNOSIS — E063 Autoimmune thyroiditis: Secondary | ICD-10-CM

## 2014-02-21 DIAGNOSIS — E041 Nontoxic single thyroid nodule: Secondary | ICD-10-CM | POA: Insufficient documentation

## 2014-02-21 DIAGNOSIS — Z808 Family history of malignant neoplasm of other organs or systems: Secondary | ICD-10-CM | POA: Insufficient documentation

## 2014-02-21 HISTORY — DX: Autoimmune thyroiditis: E06.3

## 2014-02-21 MED ORDER — OLMESARTAN MEDOXOMIL-HCTZ 40-12.5 MG PO TABS: 1.0000 | ORAL_TABLET | Freq: Every day | ORAL | Status: DC

## 2014-02-21 NOTE — Telephone Encounter (Signed)
Ok to go back on benicar and we will do prior auth to see if insurance will pay for it since failed losartan

## 2014-02-22 NOTE — Telephone Encounter (Signed)
Aware, she may start back on Benicar-Hctz. This will need prior approval from insurance.

## 2014-02-25 DIAGNOSIS — M25551 Pain in right hip: Secondary | ICD-10-CM | POA: Diagnosis not present

## 2014-03-09 ENCOUNTER — Ambulatory Visit (INDEPENDENT_AMBULATORY_CARE_PROVIDER_SITE_OTHER): Payer: Medicare Other

## 2014-03-09 DIAGNOSIS — Z23 Encounter for immunization: Secondary | ICD-10-CM | POA: Diagnosis not present

## 2014-03-30 DIAGNOSIS — Z09 Encounter for follow-up examination after completed treatment for conditions other than malignant neoplasm: Secondary | ICD-10-CM | POA: Diagnosis not present

## 2014-03-30 DIAGNOSIS — Z853 Personal history of malignant neoplasm of breast: Secondary | ICD-10-CM | POA: Diagnosis not present

## 2014-03-30 DIAGNOSIS — R921 Mammographic calcification found on diagnostic imaging of breast: Secondary | ICD-10-CM | POA: Diagnosis not present

## 2014-03-31 DIAGNOSIS — E042 Nontoxic multinodular goiter: Secondary | ICD-10-CM | POA: Diagnosis not present

## 2014-03-31 DIAGNOSIS — E041 Nontoxic single thyroid nodule: Secondary | ICD-10-CM | POA: Diagnosis not present

## 2014-04-08 NOTE — Telephone Encounter (Signed)
Back on benicar doing well and did not require a PA.

## 2014-05-19 ENCOUNTER — Ambulatory Visit (INDEPENDENT_AMBULATORY_CARE_PROVIDER_SITE_OTHER): Payer: Medicare Other | Admitting: Family Medicine

## 2014-05-19 ENCOUNTER — Encounter: Payer: Self-pay | Admitting: Family Medicine

## 2014-05-19 VITALS — BP 113/69 | HR 65 | Temp 97.0°F | Ht 65.0 in | Wt 156.0 lb

## 2014-05-19 DIAGNOSIS — I10 Essential (primary) hypertension: Secondary | ICD-10-CM

## 2014-05-19 DIAGNOSIS — E785 Hyperlipidemia, unspecified: Secondary | ICD-10-CM | POA: Diagnosis not present

## 2014-05-19 DIAGNOSIS — Z01810 Encounter for preprocedural cardiovascular examination: Secondary | ICD-10-CM | POA: Diagnosis not present

## 2014-05-19 NOTE — Progress Notes (Signed)
Subjective:    Patient ID: Ashley Holland, female    DOB: 04-27-1940, 74 y.o.   MRN: [id number]  HPI  74 year old female comes in today for surgical clearance. She is scheduled for a right hip replacement on 07/21/14. Active medical problems include hypertension and treatment for breast cancer with aromatase inhibitors. Blood pressures have been acceptable of late. Right hip pain has gotten progressively worse and is characterized by morning stiffness with increased pain later in the day. She denies specific knee pain but feels that any pain she does have in her knees is related to her hips. There are no other complaints or issues.  Patient Active Problem List   Diagnosis Date Noted  . Nose ulceration 05/19/2013  . GERD (gastroesophageal reflux disease) 05/19/2013  . Cancer of right breast 12/10/2011  . Arthritis/joint pain 09/22/2010  . Hormone replacement therapy (postmenopausal) 09/22/2010  . Menopause 09/22/2010  . HTN (hypertension) 08/21/2010  . Precordial pain   . Other and unspecified hyperlipidemia    Outpatient Encounter Prescriptions as of 05/19/2014  Medication Sig  . anastrozole (ARIMIDEX) 1 MG tablet Take 1 mg by mouth daily.    [name] aspirin 81 MG tablet Take 81 mg by mouth daily.    . Calcium Carbonate-Vitamin D (CALCIUM 600+D) 600-200 MG-UNIT TABS Take 1 tablet by mouth daily.    [name] docusate sodium (COLACE) 100 MG capsule Take 100 mg by mouth 2 (two) times daily.  . Multiple Vitamin (MULTIVITAMIN) tablet Take 1 tablet by mouth daily.    [name] olmesartan-hydrochlorothiazide (BENICAR HCT) 40-12.5 MG per tablet Take 1 tablet by mouth daily.  [name] omeprazole (PRILOSEC) 20 MG capsule Take 20 mg by mouth daily.  . simvastatin (ZOCOR) 20 MG tablet Take 0.5 tablets (10 mg total) by mouth at bedtime.  . [DISCONTINUED] Probiotic Product (TRUBIOTICS) CAPS Take 1 capsule by mouth daily.      Review of Systems  Constitutional: Negative.   HENT: Negative.   Eyes: Negative.     Respiratory: Negative.   Cardiovascular: Negative.   Gastrointestinal: Negative.   Endocrine: Negative.   Genitourinary: Negative.   Musculoskeletal: Positive for arthralgias.  Hematological: Negative.   Psychiatric/Behavioral: Negative.        Objective:   Physical Exam  Constitutional: She is oriented to person, place, and time. She appears well-developed and well-nourished.  Eyes: Conjunctivae and EOM are normal.  Neck: Normal range of motion. Neck supple.  Cardiovascular: Normal rate, regular rhythm and normal heart sounds.   Pulmonary/Chest: Effort normal and breath sounds normal.  Abdominal: Soft. Bowel sounds are normal.  Musculoskeletal: Normal range of motion.  Neurological: She is alert and oriented to person, place, and time. She has normal reflexes.  Skin: Skin is warm and dry.  Psychiatric: She has a normal mood and affect. Her behavior is normal. Thought content normal.    BP 113/69 mmHg  Pulse 65  Temp(Src) 97 F (36.1 C) (Oral)  Ht 5' 5"  (1.651 m)  Wt 156 lb (70.761 kg)  BMI 25.96 kg/m2       Assessment & Plan:  1. Essential hypertension EKG is normal. This was done as part of a preoperative evaluation given her history of hypertension. Hopefully this will not be repeated are to her surgery. - EKG 12-Lead  2. Hyperlipidemia It has not been quite 1 year since we checked her lipids and liver and since she will be in rehabilitation following hip surgery will go ahead and repeat this lab evaluation today.  She only takes 10 mg of simvastatin and tolerates this well - Lipid panel - CMP14+EGFR  Wardell Honour MD

## 2014-05-20 LAB — CMP14+EGFR
A/G RATIO: 2.3 (ref 1.1–2.5)
ALK PHOS: 93 IU/L (ref 39–117)
ALT: 14 IU/L (ref 0–32)
AST: 26 IU/L (ref 0–40)
Albumin: 4.2 g/dL (ref 3.5–4.8)
BUN / CREAT RATIO: 24 (ref 11–26)
BUN: 18 mg/dL (ref 8–27)
CALCIUM: 9.1 mg/dL (ref 8.7–10.3)
CO2: 25 mmol/L (ref 18–29)
CREATININE: 0.74 mg/dL (ref 0.57–1.00)
Chloride: 101 mmol/L (ref 97–108)
GFR calc Af Amer: 92 mL/min/{1.73_m2} (ref >59)
GFR calc non Af Amer: 80 mL/min/{1.73_m2} (ref >59)
GLOBULIN, TOTAL: 1.8 g/dL (ref 1.5–4.5)
Glucose: 81 mg/dL (ref 65–99)
POTASSIUM: 4.2 mmol/L (ref 3.5–5.2)
Sodium: 140 mmol/L (ref 134–144)
Total Bilirubin: 0.4 mg/dL (ref 0.0–1.2)
Total Protein: 6 g/dL (ref 6.0–8.5)

## 2014-05-20 LAB — LIPID PANEL
CHOL/HDL RATIO: 2.7 ratio (ref 0.0–4.4)
Cholesterol, Total: 165 mg/dL (ref 100–199)
HDL: 61 mg/dL (ref >39)
LDL CALC: 88 mg/dL (ref 0–99)
TRIGLYCERIDES: 79 mg/dL (ref 0–149)
VLDL Cholesterol Cal: 16 mg/dL (ref 5–40)

## 2014-05-24 ENCOUNTER — Other Ambulatory Visit: Payer: Self-pay | Admitting: Family Medicine

## 2014-05-24 MED ORDER — SIMVASTATIN 10 MG PO TABS: 10.0000 mg | ORAL_TABLET | Freq: Every day | ORAL | Status: DC

## 2014-05-24 NOTE — Telephone Encounter (Signed)
done

## 2014-06-23 ENCOUNTER — Ambulatory Visit: Payer: Self-pay | Admitting: Orthopedic Surgery

## 2014-06-23 NOTE — Progress Notes (Signed)
Preoperative surgical orders have been place into the [facility] hospital system for Ashley Holland on 06/23/2014, 5:34 PM  by Mickel Crow for surgery on 07-21-2014.  Preop Total Hip - Anterior Approach orders including Experel Injecion, IV Tylenol, and IV Decadron as long as there are no contraindications to the above medications. [name], PA-C

## 2014-07-05 DIAGNOSIS — R10814 Left lower quadrant abdominal tenderness: Secondary | ICD-10-CM | POA: Diagnosis not present

## 2014-07-05 DIAGNOSIS — C50919 Malignant neoplasm of unspecified site of unspecified female breast: Secondary | ICD-10-CM | POA: Diagnosis not present

## 2014-07-05 DIAGNOSIS — C50911 Malignant neoplasm of unspecified site of right female breast: Secondary | ICD-10-CM | POA: Diagnosis not present

## 2014-07-06 ENCOUNTER — Ambulatory Visit: Payer: Self-pay | Admitting: Orthopedic Surgery

## 2014-07-08 ENCOUNTER — Ambulatory Visit: Payer: Self-pay | Admitting: Orthopedic Surgery

## 2014-07-08 NOTE — Progress Notes (Signed)
Preoperative surgical orders have been place into the [facility] hospital system for Ashley Holland on 07/08/2014, 1:48 PM  by Mickel Crow for surgery on 07-21-14.  Preop Total Hip - Anterior Approach orders including Experel Injecion, IV Tylenol, and IV Decadron as long as there are no contraindications to the above medications. [name], PA-C

## 2014-07-09 DIAGNOSIS — Z90710 Acquired absence of both cervix and uterus: Secondary | ICD-10-CM | POA: Diagnosis not present

## 2014-07-09 DIAGNOSIS — N8320 Unspecified ovarian cysts: Secondary | ICD-10-CM | POA: Diagnosis not present

## 2014-07-09 DIAGNOSIS — Z9049 Acquired absence of other specified parts of digestive tract: Secondary | ICD-10-CM | POA: Diagnosis not present

## 2014-07-09 DIAGNOSIS — R109 Unspecified abdominal pain: Secondary | ICD-10-CM | POA: Diagnosis not present

## 2014-07-09 DIAGNOSIS — R10819 Abdominal tenderness, unspecified site: Secondary | ICD-10-CM | POA: Diagnosis not present

## 2014-07-09 DIAGNOSIS — R10814 Left lower quadrant abdominal tenderness: Secondary | ICD-10-CM | POA: Diagnosis not present

## 2014-07-12 NOTE — Patient Instructions (Addendum)
Ashley Holland  07/12/2014   Your procedure is scheduled on:   -07-21-2014 Wednesday  Enter through [facility]  Entrance and follow signs to [facility]. Arrive at      0730  AM..  Call this number if you have problems the morning of surgery: [phone]  Or Presurgical Testing [phone].   For Living Will and/or Health Care Power Attorney Forms: please provide copy for your medical record,may bring AM of surgery(Forms should be already notarized -we do not provide this service).(07-14-14 Yes/ Document provided previous visit, note in Epic medical record).    Do not eat food/ or drink: After Midnight.     Take these medicines the morning of surgery with A SIP OF WATER: Omeprazole.  Do not wear jewelry, make-up or nail polish.  Do not wear deodorant, lotions, powders, or perfumes.   Do not shave legs and under arms- 48 hours(2 days) prior to first CHG shower.(Shaving face and neck okay.)  Do not bring valuables to the hospital.(Hospital is not responsible for lost valuables).  Contacts, dentures or removable bridgework, body piercing, hair pins may not be worn into surgery.  Leave suitcase in the car. After surgery it may be brought to your room.  For patients admitted to the hospital, checkout time is 11:00 AM the day of discharge.(Restricted visitors-Any Persons displaying flu-like symptoms or illness).    Patients discharged the day of surgery will not be allowed to drive home. Must have responsible person with you x 24 hours once discharged.  Name and phone number of your driver :[name] ,cousin [phone] cell      Please read over the following fact sheets that you were given:  CHG(Chlorhexidine Gluconate 4% Surgical Soap) use, MRSA Information, Blood Transfusion fact sheet, Incentive Spirometry Instruction.  Remember : Type/Screen "Blue armbands" - may not be removed once applied(would result in being retested AM of surgery, if removed).         [facility] - Preparing for Surgery Before surgery, you can play an important role.  Because skin is not sterile, your skin needs to be as free of germs as possible.  You can reduce the number of germs on your skin by washing with CHG (chlorahexidine gluconate) soap before surgery.  CHG is an antiseptic cleaner which kills germs and bonds with the skin to continue killing germs even after washing. Please DO NOT use if you have an allergy to CHG or antibacterial soaps.  If your skin becomes reddened/irritated stop using the CHG and inform your nurse when you arrive at Short Stay. Do not shave (including legs and underarms) for at least 48 hours prior to the first CHG shower.  You may shave your face/neck. Please follow these instructions carefully:  1.  Shower with CHG Soap the night before surgery and the  morning of Surgery.  2.  If you choose to wash your hair, wash your hair first as usual with your  normal  shampoo.  3.  After you shampoo, rinse your hair and body thoroughly to remove the  shampoo.                           4.  Use CHG as you would any other liquid soap.  You can apply chg directly  to the skin and wash  Gently with a scrungie or clean washcloth.  5.  Apply the CHG Soap to your body ONLY FROM THE NECK DOWN.   Do not use on face/ open                           Wound or open sores. Avoid contact with eyes, ears mouth and genitals (private parts).                       Wash face,  Genitals (private parts) with your normal soap.             6.  Wash thoroughly, paying special attention to the area where your surgery  will be performed.  7.  Thoroughly rinse your body with warm water from the neck down.  8.  DO NOT shower/wash with your normal soap after using and rinsing off  the CHG Soap.                9.  Pat yourself dry with a clean towel.            10.  Wear clean pajamas.            11.  Place clean sheets on your bed the night of your first shower and do not   sleep with pets. Day of Surgery : Do not apply any lotions/deodorants the morning of surgery.  Please wear clean clothes to the hospital/surgery center.  FAILURE TO FOLLOW THESE INSTRUCTIONS MAY RESULT IN THE CANCELLATION OF YOUR SURGERY PATIENT SIGNATURE_________________________________  NURSE SIGNATURE__________________________________  ________________________________________________________________________   Adam Phenix  An incentive spirometer is a tool that can help keep your lungs clear and active. This tool measures how well you are filling your lungs with each breath. Taking long deep breaths may help reverse or decrease the chance of developing breathing (pulmonary) problems (especially infection) following:  A long period of time when you are unable to move or be active. BEFORE THE PROCEDURE   If the spirometer includes an indicator to show your best effort, your nurse or respiratory therapist will set it to a desired goal.  If possible, sit up straight or lean slightly forward. Try not to slouch.  Hold the incentive spirometer in an upright position. INSTRUCTIONS FOR USE   Sit on the edge of your bed if possible, or sit up as far as you can in bed or on a chair.  Hold the incentive spirometer in an upright position.  Breathe out normally.  Place the mouthpiece in your mouth and seal your lips tightly around it.  Breathe in slowly and as deeply as possible, raising the piston or the ball toward the top of the column.  Hold your breath for 3-5 seconds or for as long as possible. Allow the piston or ball to fall to the bottom of the column.  Remove the mouthpiece from your mouth and breathe out normally.  Rest for a few seconds and repeat Steps 1 through 7 at least 10 times every 1-2 hours when you are awake. Take your time and take a few normal breaths between deep breaths.  The spirometer may include an indicator to show your best effort. Use the  indicator as a goal to work toward during each repetition.  After each set of 10 deep breaths, practice coughing to be sure your lungs are clear. If you have an incision (the cut made at the time of  surgery), support your incision when coughing by placing a pillow or rolled up towels firmly against it. Once you are able to get out of bed, walk around indoors and cough well. You may stop using the incentive spirometer when instructed by your caregiver.  RISKS AND COMPLICATIONS  Take your time so you do not get dizzy or light-headed.  If you are in pain, you may need to take or ask for pain medication before doing incentive spirometry. It is harder to take a deep breath if you are having pain. AFTER USE  Rest and breathe slowly and easily.  It can be helpful to keep track of a log of your progress. Your caregiver can provide you with a simple table to help with this. If you are using the spirometer at home, follow these instructions: [place] IF:   You are having difficultly using the spirometer.  You have trouble using the spirometer as often as instructed.  Your pain medication is not giving enough relief while using the spirometer.  You develop fever of 100.5 F (38.1 C) or higher. SEEK IMMEDIATE MEDICAL CARE IF:   You cough up bloody sputum that had not been present before.  You develop fever of 102 F (38.9 C) or greater.  You develop worsening pain at or near the incision site. MAKE SURE YOU:   Understand these instructions.  Will watch your condition.  Will get help right away if you are not doing well or get worse. Document Released: 08/20/2006 Document Revised: 07/02/2011 Document Reviewed: 10/21/2006 Exit[employer] Patient Information 2014 [employer], [place].   ________________________________________________________________________  WHAT IS A BLOOD TRANSFUSION? Blood Transfusion Information  A transfusion is the replacement of blood or some of its parts. Blood  is made up of multiple cells which provide different functions.  Red blood cells carry oxygen and are used for blood loss replacement.  White blood cells fight against infection.  Platelets control bleeding.  Plasma helps clot blood.  Other blood products are available for specialized needs, such as hemophilia or other clotting disorders. BEFORE THE TRANSFUSION  Who gives blood for transfusions?   Healthy volunteers who are fully evaluated to make sure their blood is safe. This is blood bank blood. Transfusion therapy is the safest it has ever been in the practice of medicine. Before blood is taken from a donor, a complete history is taken to make sure that person has no history of diseases nor engages in risky social behavior (examples are intravenous drug use or sexual activity with multiple partners). The donor's travel history is screened to minimize risk of transmitting infections, such as malaria. The donated blood is tested for signs of infectious diseases, such as HIV and hepatitis. The blood is then tested to be sure it is compatible with you in order to minimize the chance of a transfusion reaction. If you or a relative donates blood, this is often done in anticipation of surgery and is not appropriate for emergency situations. It takes many days to process the donated blood. RISKS AND COMPLICATIONS Although transfusion therapy is very safe and saves many lives, the main dangers of transfusion include:   Getting an infectious disease.  Developing a transfusion reaction. This is an allergic reaction to something in the blood you were given. Every precaution is taken to prevent this. The decision to have a blood transfusion has been considered carefully by your caregiver before blood is given. Blood is not given unless the benefits outweigh the risks. AFTER THE TRANSFUSION  Right after receiving a blood transfusion, you will usually feel much better and more energetic. This is  especially true if your red blood cells have gotten low (anemic). The transfusion raises the level of the red blood cells which carry oxygen, and this usually causes an energy increase.  The nurse administering the transfusion will monitor you carefully for complications. HOME CARE INSTRUCTIONS  No special instructions are needed after a transfusion. You may find your energy is better. Speak with your caregiver about any limitations on activity for underlying diseases you may have. SEEK MEDICAL CARE IF:   Your condition is not improving after your transfusion.  You develop redness or irritation at the intravenous (IV) site. SEEK IMMEDIATE MEDICAL CARE IF:  Any of the following symptoms occur over the next 12 hours:  Shaking chills.  You have a temperature by mouth above 102 F (38.9 C), not controlled by medicine.  Chest, back, or muscle pain.  People around you feel you are not acting correctly or are confused.  Shortness of breath or difficulty breathing.  Dizziness and fainting.  You get a rash or develop hives.  You have a decrease in urine output.  Your urine turns a dark color or changes to pink, red, or brown. Any of the following symptoms occur over the next 10 days:  You have a temperature by mouth above 102 F (38.9 C), not controlled by medicine.  Shortness of breath.  Weakness after normal activity.  The white part of the eye turns yellow (jaundice).  You have a decrease in the amount of urine or are urinating less often.  Your urine turns a dark color or changes to pink, red, or brown. Document Released: 04/06/2000 Document Revised: 07/02/2011 Document Reviewed: 11/24/2007 [facility] Patient Information 2014 [place], [place].  _______________________________________________________________________

## 2014-07-14 ENCOUNTER — Encounter (HOSPITAL_COMMUNITY)
Admission: RE | Admit: 2014-07-14 | Discharge: 2014-07-14 | Disposition: A | Discharge status: Home / Self Care | Payer: Medicare Other | Source: Ambulatory Visit | Attending: Orthopedic Surgery | Admitting: Orthopedic Surgery

## 2014-07-14 ENCOUNTER — Encounter (HOSPITAL_COMMUNITY): Payer: Self-pay

## 2014-07-14 DIAGNOSIS — Z01812 Encounter for preprocedural laboratory examination: Secondary | ICD-10-CM | POA: Diagnosis not present

## 2014-07-14 HISTORY — DX: Gastro-esophageal reflux disease without esophagitis: K21.9

## 2014-07-14 LAB — URINE MICROSCOPIC-ADD ON

## 2014-07-14 LAB — URINALYSIS, ROUTINE W REFLEX MICROSCOPIC
Bilirubin Urine: NEGATIVE
Glucose, UA: NEGATIVE mg/dL
Hgb urine dipstick: NEGATIVE
Ketones, ur: NEGATIVE mg/dL
Nitrite: NEGATIVE
PH: 6 (ref 5.0–8.0)
Protein, ur: NEGATIVE mg/dL
Specific Gravity, Urine: 1.01 (ref 1.005–1.030)
Urobilinogen, UA: 1 mg/dL (ref 0.0–1.0)

## 2014-07-14 LAB — SURGICAL PCR SCREEN
MRSA, PCR: NEGATIVE
STAPHYLOCOCCUS AUREUS: NEGATIVE

## 2014-07-14 LAB — PROTIME-INR
INR: 1.05 (ref 0.00–1.49)
Prothrombin Time: 13.9 seconds (ref 11.6–15.2)

## 2014-07-14 LAB — APTT: aPTT: 34 seconds (ref 24–37)

## 2014-07-14 LAB — ABO/RH: ABO/RH(D): O POS

## 2014-07-14 NOTE — Pre-Procedure Instructions (Signed)
07-14-14 CBC/D, CMP results 07-05-14 with chart to be used. EKG 1'16 Epic .

## 2014-07-14 NOTE — Pre-Procedure Instructions (Addendum)
07-14-14 EKG 1'16 Epic,Labs: CBC/d, CMP 07-05-14  Results with chart to be used. Pt/PTT, urine,T/S, PCR screen done today.

## 2014-07-14 NOTE — H&P (Signed)
TOTAL HIP ADMISSION H&P  Patient is admitted for right total hip arthroplasty.  Subjective:  Chief Complaint: right hip pain  HPI: Ashley Holland, 74 y.o. female, has a history of pain and functional disability in the right hip(s) due to arthritis and patient has failed non-surgical conservative treatments for greater than 12 weeks to include NSAID's and/or analgesics and activity modification.  Onset of symptoms was gradual starting 3 years ago with gradually worsening course since that time.The patient noted no past surgery on the right hip(s).  Patient currently rates pain in the right hip at 7 out of 10 with activity. Patient has night pain, worsening of pain with activity and weight bearing, pain that interfers with activities of daily living, pain with passive range of motion and crepitus. Patient has evidence of periarticular osteophytes and joint space narrowing by imaging studies. This condition presents safety issues increasing the risk of falls.   There is no current active infection.  Patient Active Problem List   Diagnosis Date Noted  . Nose ulceration 05/19/2013  . GERD (gastroesophageal reflux disease) 05/19/2013  . Cancer of right breast 12/10/2011  . Arthritis/joint pain 09/22/2010  . Hormone replacement therapy (postmenopausal) 09/22/2010  . Menopause 09/22/2010  . HTN (hypertension) 08/21/2010  . Precordial pain   . Hyperlipidemia    Past Medical History  Diagnosis Date  . Unspecified essential hypertension   . Precordial pain   . Other and unspecified hyperlipidemia   . Arthritis     Per medical history form dated 11/04/09.  [name] Rectal prolapse   . Female bladder prolapse   . Malignant neoplasm of breast (female), unspecified site   . Enlarged thyroid     Past Surgical History  Procedure Laterality Date  . Abdominal hysterectomy    . Cholecystectomy    . Colonoscopy    . Djd    . Breast lumpectomy  2010    Right  . Upper gastrointestinal endoscopy  Sept  2012  . Eus  04/25/2011    Procedure: UPPER ENDOSCOPIC ULTRASOUND (EUS) RADIAL;  Surgeon: Landry Dyke, MD;  Location: [facility] ENDOSCOPY;  Service: Endoscopy;  Laterality: N/A;      Current outpatient prescriptions:  .  anastrozole (ARIMIDEX) 1 MG tablet, Take 1 mg by mouth daily.  , Disp: , Rfl:  .  aspirin 81 MG tablet, Take 81 mg by mouth daily.  , Disp: , Rfl:  .  Calcium Carbonate-Vitamin D (CALCIUM 600+D) 600-200 MG-UNIT TABS, Take 1 tablet by mouth daily.  , Disp: , Rfl:  .  docusate sodium (COLACE) 100 MG capsule, Take 200 mg by mouth at bedtime. , Disp: , Rfl:  .  Multiple Vitamin (MULTIVITAMIN) tablet, Take 1 tablet by mouth daily.  , Disp: , Rfl:  .  olmesartan-hydrochlorothiazide (BENICAR HCT) 40-12.5 MG per tablet, Take 1 tablet by mouth daily., Disp: 30 tablet, Rfl: 5 .  omeprazole (PRILOSEC) 20 MG capsule, Take 20 mg by mouth daily., Disp: , Rfl:  .  simvastatin (ZOCOR) 10 MG tablet, Take 1 tablet (10 mg total) by mouth at bedtime., Disp: 90 tablet, Rfl: 1  No Known Allergies  History  Substance Use Topics  . Smoking status: Never Smoker   . Smokeless tobacco: Never Used  . Alcohol Use: No    Family History  Problem Relation Age of Onset  . Coronary artery disease Mother      CABG age 22  . Hyperlipidemia Mother   . Stroke Mother   . Alcohol  abuse Father   . Hypertension Father   . Hypertension Sister   . Anesthesia problems Neg Hx   . Hypotension Neg Hx   . Malignant hyperthermia Neg Hx   . Pseudochol deficiency Neg Hx      Review of Systems  Constitutional: Negative.   HENT: Negative.   Eyes: Negative.   Respiratory: Negative.   Cardiovascular: Negative.   Gastrointestinal: Negative.   Genitourinary: Positive for frequency. Negative for dysuria, urgency, hematuria and flank pain.       Positive for incontinence  Musculoskeletal: Positive for joint pain. Negative for myalgias, back pain, falls and neck pain.       Right hip pain  Skin: Negative.    Neurological: Negative.   Endo/Heme/Allergies: Negative.   Psychiatric/Behavioral: Negative.     Objective:  Physical Exam  Constitutional: She is oriented to person, place, and time. She appears well-developed and well-nourished. No distress.  HENT:  Head: Normocephalic and atraumatic.  Right Ear: External ear normal.  Left Ear: External ear normal.  Nose: Nose normal.  Mouth/Throat: Oropharynx is clear and moist.  Eyes: Conjunctivae and EOM are normal.  Neck: Normal range of motion. Neck supple.  Cardiovascular: Normal rate, regular rhythm, normal heart sounds and intact distal pulses.   No murmur heard. Respiratory: Effort normal and breath sounds normal. No respiratory distress. She has no wheezes.  GI: Bowel sounds are normal. She exhibits no distension. There is no tenderness.  Musculoskeletal:       Right hip: She exhibits decreased range of motion and decreased strength.       Left hip: Normal.       Right knee: Normal.       Left knee: Normal.  Evaluation of her left hip shows normal range of motion with no discomfort. The right hip has flexion about 90, no internal rotation, about 10 external rotation and about 20-30 of abduction. Her knee examination is normal.  Neurological: She is alert and oriented to person, place, and time. She has normal strength and normal reflexes. No sensory deficit.  Skin: No rash noted. She is not diaphoretic. No erythema.  Psychiatric: She has a normal mood and affect. Her behavior is normal.    Vital signs in last 24 hours: Temp:  [97.7 F (36.5 C)] 97.7 F (36.5 C) (03/23 1448) Pulse Rate:  [66] 66 (03/23 1448) Resp:  [16] 16 (03/23 1448) BP: (125)/(64) 125/64 mmHg (03/23 1448) SpO2:  [100 %] 100 % (03/23 1448) Weight:  [68.72 kg (151 lb 8 oz)] 68.72 kg (151 lb 8 oz) (03/23 1448)  Labs:   Estimated body mass index is 24.96 kg/(m^2) as calculated from the following:   Height as of 12/10/13: 5\' 5"  (1.651 m).   Weight as of  12/10/13: 68.04 kg (150 lb).   Imaging Review Plain radiographs demonstrate severe degenerative joint disease of the right hip(s). The bone quality appears to be good for age and reported activity level.  Assessment/Plan:  End stage primary osteoarthritis, right hip(s)  The patient history, physical examination, clinical judgement of the provider and imaging studies are consistent with end stage degenerative joint disease of the right hip(s) and total hip arthroplasty is deemed medically necessary. The treatment options including medical management, injection therapy, arthroscopy and arthroplasty were discussed at length. The risks and benefits of total hip arthroplasty were presented and reviewed. The risks due to aseptic loosening, infection, stiffness, dislocation/subluxation,  thromboembolic complications and other imponderables were discussed.  The patient acknowledged the  explanation, agreed to proceed with the plan and consent was signed. Patient is being admitted for inpatient treatment for surgery, pain control, PT, OT, prophylactic antibiotics, VTE prophylaxis, progressive ambulation and ADL's and discharge planning.The patient is planning to be discharged home with home health services    TXA IV IV/BP left arm ONLY PCP: Dr. Teodoro Spray   Ardeen Jourdain, PA-C

## 2014-07-16 ENCOUNTER — Inpatient Hospital Stay (HOSPITAL_COMMUNITY): Admission: RE | Admit: 2014-07-16 | Payer: Medicare Other | Source: Ambulatory Visit | Admitting: Orthopedic Surgery

## 2014-07-16 ENCOUNTER — Encounter (HOSPITAL_COMMUNITY): Admission: RE | Payer: Self-pay | Source: Ambulatory Visit

## 2014-07-16 SURGERY — ARTHROPLASTY, HIP, TOTAL, ANTERIOR APPROACH
Anesthesia: Choice | Laterality: Right

## 2014-07-19 NOTE — Progress Notes (Signed)
07-19-14 1245 Pt. Made aware of surgery date and time change. Surgery moved to 07-26-14 1610, pt. Instructed to arrive at 1400 , may have Clear liquids only 12 midnight to 1000 AM, then nothing- all other instructions unchanged.W. Floy Sabina

## 2014-07-20 DIAGNOSIS — R935 Abnormal findings on diagnostic imaging of other abdominal regions, including retroperitoneum: Secondary | ICD-10-CM | POA: Diagnosis not present

## 2014-07-20 DIAGNOSIS — Z90710 Acquired absence of both cervix and uterus: Secondary | ICD-10-CM | POA: Diagnosis not present

## 2014-07-20 DIAGNOSIS — K838 Other specified diseases of biliary tract: Secondary | ICD-10-CM | POA: Diagnosis not present

## 2014-07-20 DIAGNOSIS — Z9049 Acquired absence of other specified parts of digestive tract: Secondary | ICD-10-CM | POA: Diagnosis not present

## 2014-07-20 DIAGNOSIS — R1032 Left lower quadrant pain: Secondary | ICD-10-CM | POA: Diagnosis not present

## 2014-07-20 DIAGNOSIS — Z780 Asymptomatic menopausal state: Secondary | ICD-10-CM | POA: Diagnosis not present

## 2014-07-21 LAB — TYPE AND SCREEN
ABO/RH(D): O POS
Antibody Screen: NEGATIVE

## 2014-07-23 NOTE — Discharge Summary (Signed)
TOTAL HIP ADMISSION H&P  Patient is admitted for right total hip arthroplasty.  Subjective:  Chief Complaint: right hip pain  HPI: Ashley Holland, 74 y.o. female, has a history of pain and functional disability in the right hip(s) due to arthritis and patient has failed non-surgical conservative treatments for greater than 12 weeks to include NSAID's and/or analgesics and activity modification. Onset of symptoms was gradual starting 3 years ago with gradually worsening course since that time.The patient noted no past surgery on the right hip(s). Patient currently rates pain in the right hip at 7 out of 10 with activity. Patient has night pain, worsening of pain with activity and weight bearing, pain that interfers with activities of daily living, pain with passive range of motion and crepitus. Patient has evidence of periarticular osteophytes and joint space narrowing by imaging studies. This condition presents safety issues increasing the risk of falls. There is no current active infection.  Patient Active Problem List   Diagnosis Date Noted  . Nose ulceration 05/19/2013  . GERD (gastroesophageal reflux disease) 05/19/2013  . Cancer of right breast 12/10/2011  . Arthritis/joint pain 09/22/2010  . Hormone replacement therapy (postmenopausal) 09/22/2010  . Menopause 09/22/2010  . HTN (hypertension) 08/21/2010  . Precordial pain   . Hyperlipidemia    Past Medical History  Diagnosis Date  . Unspecified essential hypertension   . Precordial pain   . Other and unspecified hyperlipidemia   . Arthritis     Per medical history form dated 11/04/09.  [name] Rectal prolapse   . Female bladder prolapse   . Malignant neoplasm of breast (female), unspecified site   . Enlarged thyroid     Past Surgical History  Procedure Laterality Date  . Abdominal hysterectomy    . Cholecystectomy    . Colonoscopy    . Djd     . Breast lumpectomy  2010    Right  . Upper gastrointestinal endoscopy  Sept 2012  . Eus  04/25/2011    Procedure: UPPER ENDOSCOPIC ULTRASOUND (EUS) RADIAL; Surgeon: Landry Dyke, MD; Location: [facility]; Service: Endoscopy; Laterality: N/A;      Current outpatient prescriptions:  . anastrozole (ARIMIDEX) 1 MG tablet, Take 1 mg by mouth daily. , Disp: , Rfl:  . aspirin 81 MG tablet, Take 81 mg by mouth daily. , Disp: , Rfl:  . Calcium Carbonate-Vitamin D (CALCIUM 600+D) 600-200 MG-UNIT TABS, Take 1 tablet by mouth daily. , Disp: , Rfl:  . docusate sodium (COLACE) 100 MG capsule, Take 200 mg by mouth at bedtime. , Disp: , Rfl:  . Multiple Vitamin (MULTIVITAMIN) tablet, Take 1 tablet by mouth daily. , Disp: , Rfl:  . olmesartan-hydrochlorothiazide (BENICAR HCT) 40-12.5 MG per tablet, Take 1 tablet by mouth daily., Disp: 30 tablet, Rfl: 5 . omeprazole (PRILOSEC) 20 MG capsule, Take 20 mg by mouth daily., Disp: , Rfl:  . simvastatin (ZOCOR) 10 MG tablet, Take 1 tablet (10 mg total) by mouth at bedtime., Disp: 90 tablet, Rfl: 1  No Known Allergies  History  Substance Use Topics  . Smoking status: Never Smoker   . Smokeless tobacco: Never Used  . Alcohol Use: No    Family History  Problem Relation Age of Onset  . Coronary artery disease Mother     CABG age 59  . Hyperlipidemia Mother   . Stroke Mother   . Alcohol abuse Father   . Hypertension Father   . Hypertension Sister   . Anesthesia problems Neg Hx   .  Hypotension Neg Hx   . Malignant hyperthermia Neg Hx   . Pseudochol deficiency Neg Hx      Review of Systems  Constitutional: Negative.  HENT: Negative.  Eyes: Negative.  Respiratory: Negative.  Cardiovascular: Negative.  Gastrointestinal: Negative.  Genitourinary: Positive for frequency. Negative for dysuria, urgency, hematuria and flank pain.    Positive for incontinence  Musculoskeletal: Positive for joint pain. Negative for myalgias, back pain, falls and neck pain.   Right hip pain  Skin: Negative.  Neurological: Negative.  Endo/Heme/Allergies: Negative.  Psychiatric/Behavioral: Negative.    Objective:  Physical Exam  Constitutional: She is oriented to person, place, and time. She appears well-developed and well-nourished. No distress.  HENT:  Head: Normocephalic and atraumatic.  Right Ear: External ear normal.  Left Ear: External ear normal.  Nose: Nose normal.  Mouth/Throat: Oropharynx is clear and moist.  Eyes: Conjunctivae and EOM are normal.  Neck: Normal range of motion. Neck supple.  Cardiovascular: Normal rate, regular rhythm, normal heart sounds and intact distal pulses.  No murmur heard. Respiratory: Effort normal and breath sounds normal. No respiratory distress. She has no wheezes.  GI: Bowel sounds are normal. She exhibits no distension. There is no tenderness.  Musculoskeletal:   Right hip: She exhibits decreased range of motion and decreased strength.   Left hip: Normal.   Right knee: Normal.   Left knee: Normal.  Evaluation of her left hip shows normal range of motion with no discomfort. The right hip has flexion about 90, no internal rotation, about 10 external rotation and about 20-30 of abduction. Her knee examination is normal.  Neurological: She is alert and oriented to person, place, and time. She has normal strength and normal reflexes. No sensory deficit.  Skin: No rash noted. She is not diaphoretic. No erythema.  Psychiatric: She has a normal mood and affect. Her behavior is normal.    Vital signs in last 24 hours: Temp: [97.7 F (36.5 C)] 97.7 F (36.5 C) (03/23 1448) Pulse Rate: [66] 66 (03/23 1448) Resp: [16] 16 (03/23 1448) BP: (125)/(64) 125/64 mmHg (03/23 1448) SpO2: [100 %] 100 % (03/23 1448) Weight: [68.72 kg (151 lb 8 oz)] 68.72 kg (151 lb 8  oz) (03/23 1448)  Labs:   Estimated body mass index is 24.96 kg/(m^2) as calculated from the following:  Height as of 12/10/13: 5\' 5"  (1.651 m).  Weight as of 12/10/13: 68.04 kg (150 lb).   Imaging Review Plain radiographs demonstrate severe degenerative joint disease of the right hip(s). The bone quality appears to be good for age and reported activity level.  Assessment/Plan:  End stage primary osteoarthritis, right hip(s)  The patient history, physical examination, clinical judgement of the provider and imaging studies are consistent with end stage degenerative joint disease of the right hip(s) and total hip arthroplasty is deemed medically necessary. The treatment options including medical management, injection therapy, arthroscopy and arthroplasty were discussed at length. The risks and benefits of total hip arthroplasty were presented and reviewed. The risks due to aseptic loosening, infection, stiffness, dislocation/subluxation, thromboembolic complications and other imponderables were discussed. The patient acknowledged the explanation, agreed to proceed with the plan and consent was signed. Patient is being admitted for inpatient treatment for surgery, pain control, PT, OT, prophylactic antibiotics, VTE prophylaxis, progressive ambulation and ADL's and discharge planning.The patient is planning to be discharged home with home health services    TXA IV IV/BP left arm ONLY PCP: Dr. Teodoro Spray   Ardeen Jourdain, PA-C

## 2014-07-26 ENCOUNTER — Inpatient Hospital Stay (HOSPITAL_COMMUNITY)
Admission: RE | Admit: 2014-07-26 | Discharge: 2014-07-28 | DRG: 470 | Disposition: A | Discharge status: Home Health | Payer: Medicare Other | Source: Ambulatory Visit | Attending: Orthopedic Surgery | Admitting: Orthopedic Surgery

## 2014-07-26 ENCOUNTER — Inpatient Hospital Stay (HOSPITAL_COMMUNITY): Payer: Medicare Other | Admitting: Anesthesiology

## 2014-07-26 ENCOUNTER — Encounter (HOSPITAL_COMMUNITY)
Admission: RE | Disposition: A | Discharge status: Home Health | Payer: Self-pay | Source: Ambulatory Visit | Attending: Orthopedic Surgery

## 2014-07-26 ENCOUNTER — Encounter (HOSPITAL_COMMUNITY): Payer: Self-pay | Admitting: Anesthesiology

## 2014-07-26 ENCOUNTER — Inpatient Hospital Stay (HOSPITAL_COMMUNITY): Payer: Medicare Other

## 2014-07-26 DIAGNOSIS — Z79899 Other long term (current) drug therapy: Secondary | ICD-10-CM | POA: Diagnosis not present

## 2014-07-26 DIAGNOSIS — M169 Osteoarthritis of hip, unspecified: Secondary | ICD-10-CM | POA: Diagnosis not present

## 2014-07-26 DIAGNOSIS — M25551 Pain in right hip: Secondary | ICD-10-CM | POA: Diagnosis not present

## 2014-07-26 DIAGNOSIS — E785 Hyperlipidemia, unspecified: Secondary | ICD-10-CM | POA: Diagnosis present

## 2014-07-26 DIAGNOSIS — I10 Essential (primary) hypertension: Secondary | ICD-10-CM | POA: Diagnosis present

## 2014-07-26 DIAGNOSIS — Z01812 Encounter for preprocedural laboratory examination: Secondary | ICD-10-CM | POA: Diagnosis not present

## 2014-07-26 DIAGNOSIS — M1611 Unilateral primary osteoarthritis, right hip: Principal | ICD-10-CM | POA: Diagnosis present

## 2014-07-26 DIAGNOSIS — C50919 Malignant neoplasm of unspecified site of unspecified female breast: Secondary | ICD-10-CM | POA: Diagnosis not present

## 2014-07-26 DIAGNOSIS — Z7982 Long term (current) use of aspirin: Secondary | ICD-10-CM

## 2014-07-26 DIAGNOSIS — Z853 Personal history of malignant neoplasm of breast: Secondary | ICD-10-CM

## 2014-07-26 DIAGNOSIS — Z96649 Presence of unspecified artificial hip joint: Secondary | ICD-10-CM

## 2014-07-26 DIAGNOSIS — Z471 Aftercare following joint replacement surgery: Secondary | ICD-10-CM | POA: Diagnosis not present

## 2014-07-26 DIAGNOSIS — K219 Gastro-esophageal reflux disease without esophagitis: Secondary | ICD-10-CM | POA: Diagnosis present

## 2014-07-26 DIAGNOSIS — Z96641 Presence of right artificial hip joint: Secondary | ICD-10-CM | POA: Diagnosis not present

## 2014-07-26 HISTORY — PX: TOTAL HIP ARTHROPLASTY: SHX124

## 2014-07-26 SURGERY — ARTHROPLASTY, HIP, TOTAL, ANTERIOR APPROACH
Anesthesia: Spinal | Site: Hip | Laterality: Right

## 2014-07-26 MED ORDER — METOCLOPRAMIDE HCL 5 MG/ML IJ SOLN: 5.0000 mg | Freq: Three times a day (TID) | INTRAMUSCULAR | Status: DC | PRN

## 2014-07-26 MED ORDER — DEXAMETHASONE SODIUM PHOSPHATE 10 MG/ML IJ SOLN: 10.0000 mg | Freq: Once | INTRAMUSCULAR | Status: DC

## 2014-07-26 MED ORDER — PHENOL 1.4 % MT LIQD: 1.0000 | OROMUCOSAL | Status: DC | PRN

## 2014-07-26 MED ORDER — RIVAROXABAN 10 MG PO TABS
10.0000 mg | ORAL_TABLET | Freq: Every day | ORAL | Status: DC
  Administered 2014-07-27 – 2014-07-28 (×2): 10 mg via ORAL
  Filled 2014-07-26 (×3): qty 1

## 2014-07-26 MED ORDER — ACETAMINOPHEN 500 MG PO TABS
1000.0000 mg | ORAL_TABLET | Freq: Four times a day (QID) | ORAL | Status: AC
  Administered 2014-07-26 – 2014-07-27 (×4): 1000 mg via ORAL
  Filled 2014-07-26 (×4): qty 2

## 2014-07-26 MED ORDER — PROPOFOL 10 MG/ML IV BOLUS
INTRAVENOUS | Status: AC
  Filled 2014-07-26: qty 20

## 2014-07-26 MED ORDER — PHENYLEPHRINE HCL 10 MG/ML IJ SOLN
INTRAMUSCULAR | Status: AC
  Filled 2014-07-26: qty 1

## 2014-07-26 MED ORDER — DEXTROSE 5 % IV SOLN
500.0000 mg | Freq: Four times a day (QID) | INTRAVENOUS | Status: DC | PRN
  Filled 2014-07-26: qty 5

## 2014-07-26 MED ORDER — ONDANSETRON HCL 4 MG/2ML IJ SOLN
INTRAMUSCULAR | Status: AC
  Filled 2014-07-26: qty 2

## 2014-07-26 MED ORDER — ONDANSETRON HCL 4 MG PO TABS: 4.0000 mg | ORAL_TABLET | Freq: Four times a day (QID) | ORAL | Status: DC | PRN

## 2014-07-26 MED ORDER — CHLORHEXIDINE GLUCONATE 4 % EX LIQD: 60.0000 mL | Freq: Once | CUTANEOUS | Status: DC

## 2014-07-26 MED ORDER — 0.9 % SODIUM CHLORIDE (POUR BTL) OPTIME
TOPICAL | Status: DC | PRN
  Administered 2014-07-26: 1000 mL

## 2014-07-26 MED ORDER — PROPOFOL 10 MG/ML IV BOLUS
INTRAVENOUS | Status: DC | PRN
  Administered 2014-07-26: 50 mg via INTRAVENOUS

## 2014-07-26 MED ORDER — BUPIVACAINE HCL (PF) 0.25 % IJ SOLN
INTRAMUSCULAR | Status: DC | PRN
Start: 2014-07-26 — End: 2014-07-26
  Administered 2014-07-26: 20 mL

## 2014-07-26 MED ORDER — CEFAZOLIN SODIUM-DEXTROSE 2-3 GM-% IV SOLR
2.0000 g | Freq: Four times a day (QID) | INTRAVENOUS | Status: AC
  Administered 2014-07-26 – 2014-07-27 (×2): 2 g via INTRAVENOUS
  Filled 2014-07-26 (×2): qty 50

## 2014-07-26 MED ORDER — DIPHENHYDRAMINE HCL 12.5 MG/5ML PO ELIX: 12.5000 mg | ORAL_SOLUTION | ORAL | Status: DC | PRN

## 2014-07-26 MED ORDER — ACETAMINOPHEN 10 MG/ML IV SOLN
1000.0000 mg | Freq: Once | INTRAVENOUS | Status: DC
  Filled 2014-07-26: qty 100

## 2014-07-26 MED ORDER — ACETAMINOPHEN 650 MG RE SUPP: 650.0000 mg | Freq: Four times a day (QID) | RECTAL | Status: DC | PRN

## 2014-07-26 MED ORDER — ONDANSETRON HCL 4 MG/2ML IJ SOLN: 4.0000 mg | Freq: Four times a day (QID) | INTRAMUSCULAR | Status: DC | PRN

## 2014-07-26 MED ORDER — SODIUM CHLORIDE 0.9 % IV SOLN: INTRAVENOUS | Status: DC

## 2014-07-26 MED ORDER — TRAMADOL HCL 50 MG PO TABS: 50.0000 mg | ORAL_TABLET | Freq: Four times a day (QID) | ORAL | Status: DC | PRN

## 2014-07-26 MED ORDER — SODIUM CHLORIDE 0.9 % IV SOLN
INTRAVENOUS | Status: DC
  Administered 2014-07-26 – 2014-07-27 (×2): via INTRAVENOUS

## 2014-07-26 MED ORDER — MORPHINE SULFATE 2 MG/ML IJ SOLN
1.0000 mg | INTRAMUSCULAR | Status: DC | PRN
  Administered 2014-07-26: 1 mg via INTRAVENOUS
  Filled 2014-07-26: qty 1

## 2014-07-26 MED ORDER — BUPIVACAINE IN DEXTROSE 0.75-8.25 % IT SOLN
INTRATHECAL | Status: DC | PRN
  Administered 2014-07-26: 2 mL via INTRATHECAL

## 2014-07-26 MED ORDER — MENTHOL 3 MG MT LOZG: 1.0000 | LOZENGE | OROMUCOSAL | Status: DC | PRN

## 2014-07-26 MED ORDER — DOCUSATE SODIUM 100 MG PO CAPS
100.0000 mg | ORAL_CAPSULE | Freq: Two times a day (BID) | ORAL | Status: DC
  Administered 2014-07-26 – 2014-07-28 (×4): 100 mg via ORAL

## 2014-07-26 MED ORDER — MIDAZOLAM HCL 2 MG/2ML IJ SOLN
INTRAMUSCULAR | Status: AC
  Filled 2014-07-26: qty 2

## 2014-07-26 MED ORDER — LACTATED RINGERS IV SOLN
INTRAVENOUS | Status: DC
  Administered 2014-07-26: 1000 mL via INTRAVENOUS

## 2014-07-26 MED ORDER — METHOCARBAMOL 500 MG PO TABS
500.0000 mg | ORAL_TABLET | Freq: Four times a day (QID) | ORAL | Status: DC | PRN
  Administered 2014-07-26: 500 mg via ORAL
  Filled 2014-07-26: qty 1

## 2014-07-26 MED ORDER — ACETAMINOPHEN 10 MG/ML IV SOLN
INTRAVENOUS | Status: DC | PRN
  Administered 2014-07-26: 1000 mg via INTRAVENOUS

## 2014-07-26 MED ORDER — POLYETHYLENE GLYCOL 3350 17 G PO PACK: 17.0000 g | PACK | Freq: Every day | ORAL | Status: DC | PRN

## 2014-07-26 MED ORDER — FENTANYL CITRATE 0.05 MG/ML IJ SOLN
INTRAMUSCULAR | Status: DC | PRN
  Administered 2014-07-26: 50 ug via INTRAVENOUS

## 2014-07-26 MED ORDER — IRBESARTAN 300 MG PO TABS
300.0000 mg | ORAL_TABLET | Freq: Every day | ORAL | Status: DC
Start: 2014-07-27 — End: 2014-07-28
  Administered 2014-07-28: 300 mg via ORAL
  Filled 2014-07-26 (×2): qty 1

## 2014-07-26 MED ORDER — SODIUM CHLORIDE 0.9 % IJ SOLN
INTRAMUSCULAR | Status: AC
  Filled 2014-07-26: qty 50

## 2014-07-26 MED ORDER — PROPOFOL INFUSION 10 MG/ML OPTIME
INTRAVENOUS | Status: DC | PRN
  Administered 2014-07-26: 50 ug/kg/min via INTRAVENOUS

## 2014-07-26 MED ORDER — CEFAZOLIN SODIUM-DEXTROSE 2-3 GM-% IV SOLR
2.0000 g | INTRAVENOUS | Status: AC
  Administered 2014-07-26: 2 g via INTRAVENOUS

## 2014-07-26 MED ORDER — OLMESARTAN MEDOXOMIL-HCTZ 40-12.5 MG PO TABS: 1.0000 | ORAL_TABLET | Freq: Every day | ORAL | Status: DC

## 2014-07-26 MED ORDER — OXYCODONE HCL 5 MG PO TABS
5.0000 mg | ORAL_TABLET | ORAL | Status: DC | PRN
  Administered 2014-07-26 – 2014-07-28 (×4): 10 mg via ORAL
  Administered 2014-07-28: 5 mg via ORAL
  Filled 2014-07-26 (×3): qty 2
  Filled 2014-07-26: qty 1
  Filled 2014-07-26: qty 2

## 2014-07-26 MED ORDER — SODIUM CHLORIDE 0.9 % IJ SOLN
INTRAMUSCULAR | Status: DC | PRN
  Administered 2014-07-26: 30 mL

## 2014-07-26 MED ORDER — HYDROCHLOROTHIAZIDE 12.5 MG PO CAPS
12.5000 mg | ORAL_CAPSULE | Freq: Every day | ORAL | Status: DC
  Administered 2014-07-28: 12.5 mg via ORAL
  Filled 2014-07-26 (×2): qty 1

## 2014-07-26 MED ORDER — ONDANSETRON HCL 4 MG/2ML IJ SOLN
INTRAMUSCULAR | Status: DC | PRN
  Administered 2014-07-26: 4 mg via INTRAVENOUS

## 2014-07-26 MED ORDER — FLEET ENEMA 7-19 GM/118ML RE ENEM: 1.0000 | ENEMA | Freq: Once | RECTAL | Status: AC | PRN

## 2014-07-26 MED ORDER — LACTATED RINGERS IV SOLN: INTRAVENOUS | Status: DC

## 2014-07-26 MED ORDER — SIMVASTATIN 10 MG PO TABS
10.0000 mg | ORAL_TABLET | Freq: Every day | ORAL | Status: DC
  Administered 2014-07-26 – 2014-07-27 (×2): 10 mg via ORAL
  Filled 2014-07-26 (×3): qty 1

## 2014-07-26 MED ORDER — BISACODYL 10 MG RE SUPP: 10.0000 mg | Freq: Every day | RECTAL | Status: DC | PRN

## 2014-07-26 MED ORDER — CEFAZOLIN SODIUM-DEXTROSE 2-3 GM-% IV SOLR
INTRAVENOUS | Status: AC
  Filled 2014-07-26: qty 50

## 2014-07-26 MED ORDER — STERILE WATER FOR IRRIGATION IR SOLN
Status: DC | PRN
  Administered 2014-07-26: 1500 mL

## 2014-07-26 MED ORDER — PANTOPRAZOLE SODIUM 40 MG PO TBEC
40.0000 mg | DELAYED_RELEASE_TABLET | Freq: Every day | ORAL | Status: DC
  Filled 2014-07-26: qty 1

## 2014-07-26 MED ORDER — DEXAMETHASONE SODIUM PHOSPHATE 10 MG/ML IJ SOLN
INTRAMUSCULAR | Status: AC
  Filled 2014-07-26: qty 1

## 2014-07-26 MED ORDER — BUPIVACAINE LIPOSOME 1.3 % IJ SUSP
INTRAMUSCULAR | Status: DC | PRN
  Administered 2014-07-26: 20 mL

## 2014-07-26 MED ORDER — BUPIVACAINE HCL (PF) 0.25 % IJ SOLN
INTRAMUSCULAR | Status: AC
  Filled 2014-07-26: qty 30

## 2014-07-26 MED ORDER — PHENYLEPHRINE HCL 10 MG/ML IJ SOLN
10.0000 mg | INTRAVENOUS | Status: DC | PRN
  Administered 2014-07-26: 25 ug/min via INTRAVENOUS

## 2014-07-26 MED ORDER — HYDROMORPHONE HCL 1 MG/ML IJ SOLN: 0.2500 mg | INTRAMUSCULAR | Status: DC | PRN

## 2014-07-26 MED ORDER — BUPIVACAINE LIPOSOME 1.3 % IJ SUSP
20.0000 mL | Freq: Once | INTRAMUSCULAR | Status: DC
  Filled 2014-07-26: qty 20

## 2014-07-26 MED ORDER — METOCLOPRAMIDE HCL 10 MG PO TABS: 5.0000 mg | ORAL_TABLET | Freq: Three times a day (TID) | ORAL | Status: DC | PRN

## 2014-07-26 MED ORDER — TRANEXAMIC ACID 100 MG/ML IV SOLN
1000.0000 mg | INTRAVENOUS | Status: AC
  Administered 2014-07-26: 1000 mg via INTRAVENOUS
  Filled 2014-07-26: qty 10

## 2014-07-26 MED ORDER — ANASTROZOLE 1 MG PO TABS
1.0000 mg | ORAL_TABLET | Freq: Every day | ORAL | Status: DC
  Administered 2014-07-27 – 2014-07-28 (×2): 1 mg via ORAL
  Filled 2014-07-26 (×2): qty 1

## 2014-07-26 MED ORDER — DEXAMETHASONE SODIUM PHOSPHATE 10 MG/ML IJ SOLN
10.0000 mg | Freq: Once | INTRAMUSCULAR | Status: AC
  Administered 2014-07-27: 10 mg via INTRAVENOUS
  Filled 2014-07-26: qty 1

## 2014-07-26 MED ORDER — FENTANYL CITRATE 0.05 MG/ML IJ SOLN
INTRAMUSCULAR | Status: AC
  Filled 2014-07-26: qty 2

## 2014-07-26 MED ORDER — ACETAMINOPHEN 325 MG PO TABS
650.0000 mg | ORAL_TABLET | Freq: Four times a day (QID) | ORAL | Status: DC | PRN
  Administered 2014-07-27: 650 mg via ORAL
  Filled 2014-07-26: qty 2

## 2014-07-26 SURGICAL SUPPLY — 41 items
BAG DECANTER FOR FLEXI CONT (MISCELLANEOUS) ×3 IMPLANT
BAG ZIPLOCK 12X15 (MISCELLANEOUS) IMPLANT
BLADE EXTENDED COATED 6.5IN (ELECTRODE) ×3 IMPLANT
BLADE SAG 18X100X1.27 (BLADE) ×3 IMPLANT
CAPT HIP TOTAL 2 ×3 IMPLANT
CLOSURE WOUND 1/2 X4 (GAUZE/BANDAGES/DRESSINGS) ×2
COVER PERINEAL POST (MISCELLANEOUS) ×3 IMPLANT
DECANTER SPIKE VIAL GLASS SM (MISCELLANEOUS) ×3 IMPLANT
DRAPE C-ARM 42X120 X-RAY (DRAPES) ×3 IMPLANT
DRAPE STERI IOBAN 125X83 (DRAPES) ×3 IMPLANT
DRAPE U-SHAPE 47X51 STRL (DRAPES) ×9 IMPLANT
DRSG ADAPTIC 3X8 NADH LF (GAUZE/BANDAGES/DRESSINGS) ×3 IMPLANT
DRSG MEPILEX BORDER 4X4 (GAUZE/BANDAGES/DRESSINGS) ×3 IMPLANT
DRSG MEPILEX BORDER 4X8 (GAUZE/BANDAGES/DRESSINGS) ×3 IMPLANT
DURAPREP 26ML APPLICATOR (WOUND CARE) ×3 IMPLANT
ELECT REM PT RETURN 9FT ADLT (ELECTROSURGICAL) ×3
ELECTRODE REM PT RTRN 9FT ADLT (ELECTROSURGICAL) ×1 IMPLANT
EVACUATOR 1/8 PVC DRAIN (DRAIN) ×3 IMPLANT
FACESHIELD WRAPAROUND (MASK) ×12 IMPLANT
GLOVE BIO SURGEON STRL SZ7.5 (GLOVE) ×3 IMPLANT
GLOVE BIO SURGEON STRL SZ8 (GLOVE) ×6 IMPLANT
GLOVE BIOGEL PI IND STRL 8 (GLOVE) ×2 IMPLANT
GLOVE BIOGEL PI INDICATOR 8 (GLOVE) ×4
GOWN STRL REUS W/TWL LRG LVL3 (GOWN DISPOSABLE) ×3 IMPLANT
GOWN STRL REUS W/TWL XL LVL3 (GOWN DISPOSABLE) ×3 IMPLANT
KIT BASIN OR (CUSTOM PROCEDURE TRAY) ×3 IMPLANT
NDL SAFETY ECLIPSE 18X1.5 (NEEDLE) ×1 IMPLANT
NEEDLE HYPO 18GX1.5 SHARP (NEEDLE) ×2
PACK TOTAL JOINT (CUSTOM PROCEDURE TRAY) ×3 IMPLANT
PEN SKIN MARKING BROAD (MISCELLANEOUS) ×3 IMPLANT
STRIP CLOSURE SKIN 1/2X4 (GAUZE/BANDAGES/DRESSINGS) ×4 IMPLANT
SUT ETHIBOND NAB CT1 #1 30IN (SUTURE) ×3 IMPLANT
SUT MNCRL AB 4-0 PS2 18 (SUTURE) ×3 IMPLANT
SUT VIC AB 2-0 CT1 27 (SUTURE) ×4
SUT VIC AB 2-0 CT1 TAPERPNT 27 (SUTURE) ×2 IMPLANT
SUT VLOC 180 0 24IN GS25 (SUTURE) ×3 IMPLANT
SYR 30ML LL (SYRINGE) ×3 IMPLANT
SYR 50ML LL SCALE MARK (SYRINGE) IMPLANT
TOWEL OR 17X26 10 PK STRL BLUE (TOWEL DISPOSABLE) ×3 IMPLANT
TOWEL OR NON WOVEN STRL DISP B (DISPOSABLE) IMPLANT
TRAY FOLEY CATH 14FRSI W/METER (CATHETERS) ×3 IMPLANT

## 2014-07-26 NOTE — Interval H&P Note (Signed)
History and Physical Interval Note:  07/26/2014 3:49 PM  Ashley Holland  has presented today for surgery, with the diagnosis of OA RIGHT HIP  The various methods of treatment have been discussed with the patient and family. After consideration of risks, benefits and other options for treatment, the patient has consented to  Procedure(s): RIGHT TOTAL HIP ARTHROPLASTY ANTERIOR APPROACH (Right) as a surgical intervention .  The patient's history has been reviewed, patient examined, no change in status, stable for surgery.  I have reviewed the patient's chart and labs.  Questions were answered to the patient's satisfaction.     [name]

## 2014-07-26 NOTE — Plan of Care (Signed)
Problem: Consults Goal: Diagnosis- Total Joint Replacement Primary Total Hip, Right Anterior

## 2014-07-26 NOTE — Transfer of Care (Signed)
Immediate Anesthesia Transfer of Care Note  Patient: Ashley Holland  Procedure(s) Performed: Procedure(s): RIGHT TOTAL HIP ARTHROPLASTY ANTERIOR APPROACH (Right)  Patient Location: PACU  Anesthesia Type:Spinal  Level of Consciousness: awake, alert , oriented and patient cooperative  Airway & Oxygen Therapy: Patient Spontanous Breathing and Patient connected to face mask oxygen  Post-op Assessment: Report given to RN and Post -op Vital signs reviewed and stable  Post vital signs: Reviewed and stable  Last Vitals:  Filed Vitals:   07/26/14 1353  BP: 153/91  Pulse: 94  Temp: 36.4 C  Resp: 18    Complications: No apparent anesthesia complications

## 2014-07-26 NOTE — Op Note (Signed)
OPERATIVE REPORT  PREOPERATIVE DIAGNOSIS: Osteoarthritis of the Right hip.   POSTOPERATIVE DIAGNOSIS: Osteoarthritis of the Right  hip.   PROCEDURE: Right total hip arthroplasty, anterior approach.   SURGEON: Gaynelle Arabian, MD   ASSISTANT: [name], PA-C  ANESTHESIA:  Spinal  ESTIMATED BLOOD LOSS:-400 ml  DRAINS: Hemovac x1.   COMPLICATIONS: None   CONDITION: PACU - hemodynamically stable.   BRIEF CLINICAL NOTE: Ashley Holland is a 74 y.o. female who has advanced end-  stage arthritis of her Right  hip with progressively worsening pain and  dysfunction.The patient has failed nonoperative management and presents for  total hip arthroplasty.   PROCEDURE IN DETAIL: After successful administration of spinal  anesthetic, the traction boots for the [facility] bed were placed on both  feet and the patient was placed onto the [facility] bed, boots placed into the leg  holders. The Right hip was then isolated from the perineum with plastic  drapes and prepped and draped in the usual sterile fashion. ASIS and  greater trochanter were marked and a oblique incision was made, starting  at about 1 cm lateral and 2 cm distal to the ASIS and coursing towards  the anterior cortex of the femur. The skin was cut with a 10 blade  through subcutaneous tissue to the level of the fascia overlying the  tensor fascia lata muscle. The fascia was then incised in line with the  incision at the junction of the anterior third and posterior 2/3rd. The  muscle was teased off the fascia and then the interval between the TFL  and the rectus was developed. The Hohmann retractor was then placed at  the top of the femoral neck over the capsule. The vessels overlying the  capsule were cauterized and the fat on top of the capsule was removed.  A Hohmann retractor was then placed anterior underneath the rectus  femoris to give exposure to the entire anterior capsule. A T-shaped  capsulotomy was performed.  The edges were tagged and the femoral head  was identified.       Osteophytes are removed off the superior acetabulum.  The femoral neck was then cut in situ with an oscillating saw. Traction  was then applied to the left lower extremity utilizing the [facility]  traction. The femoral head was then removed. Retractors were placed  around the acetabulum and then circumferential removal of the labrum was  performed. Osteophytes were also removed. Reaming starts at 43 mm to  medialize and  Increased in 2 mm increments to 49 mm. We reamed in  approximately 40 degrees of abduction, 20 degrees anteversion. A 50 mm  pinnacle acetabular shell was then impacted in anatomic position under  fluoroscopic guidance with excellent purchase. We did not need to place  any additional dome screws. A 32 mm neutral + 4 marathon liner was then  placed into the acetabular shell.       The femoral lift was then placed along the lateral aspect of the femur  just distal to the vastus ridge. The leg was  externally rotated and capsule  was stripped off the inferior aspect of the femoral neck down to the  level of the lesser trochanter, this was done with electrocautery. The femur was lifted after this was performed. The  leg was then placed and extended in adducted position to essentially delivering the femur. We also removed the capsule superiorly and the  piriformis from the piriformis fossa  to gain excellent exposure of the  proximal femur. Rongeur was used to remove some cancellous bone to get  into the lateral portion of the proximal femur for placement of the  initial starter reamer. The starter broaches was placed  the starter broach  and was shown to go down the center of the canal. Broaching  with the  Corail system was then performed starting at size 8, coursing  Up to size 10. A size 10 had excellent torsional and rotational  and axial stability. The trial standard offset neck was then placed  with a 32 + 1  trial head. The hip was then reduced. We confirmed that  the stem was in the canal both on AP and lateral x-rays. It also has excellent sizing. The hip was reduced with outstanding stability through full extension, full external rotation,  and then flexion in adduction internal rotation. AP pelvis was taken  and the leg lengths were measured and found to be exactly equal. Hip  was then dislocated again and the femoral head and neck removed. The  femoral broach was removed. Size 10 Corail stem with a standard offset  neck was then impacted into the femur following native anteversion. Has  excellent purchase in the canal. Excellent torsional and rotational and  axial stability. It is confirmed to be in the canal on AP and lateral  fluoroscopic views. The 32 + 1 ceramic head was placed and the hip  reduced with outstanding stability. Again AP pelvis was taken and it  confirmed that the leg lengths were equal. The wound was then copiously  irrigated with saline solution and the capsule reattached and repaired  with Ethibond suture.  20 mL of Exparel mixed with 50 mL of saline then additional 20 ml of .25% Bupivicaine injected into the capsule and into the edge of the tensor fascia lata as well as subcutaneous tissue. The fascia overlying the tensor fascia lata was  then closed with a running #1 V-Loc. Subcu was closed with interrupted  2-0 Vicryl and subcuticular running 4-0 Monocryl. Incision was cleaned  and dried. Steri-Strips and a bulky sterile dressing applied. Hemovac  drain was hooked to suction and then he was awakened and transported to  recovery in stable condition.        Please note that a surgical assistant was a medical necessity for this procedure to perform it in a safe and expeditious manner. Assistant was necessary to provide appropriate retraction of vital neurovascular structures and to prevent femoral fracture and allow for anatomic placement of the prosthesis.  Gaynelle Arabian,  M.D.

## 2014-07-26 NOTE — Anesthesia Postprocedure Evaluation (Signed)
  Anesthesia Post-op Note  Patient: Ashley Holland  Procedure(s) Performed: Procedure(s) (LRB): RIGHT TOTAL HIP ARTHROPLASTY ANTERIOR APPROACH (Right)  Patient Location: PACU  Anesthesia Type: Spinal  Level of Consciousness: awake and alert   Airway and Oxygen Therapy: Patient Spontanous Breathing  Post-op Pain: mild  Post-op Assessment: Post-op Vital signs reviewed, Patient's Cardiovascular Status Stable, Respiratory Function Stable, Patent Airway and No signs of Nausea or vomiting  Last Vitals:  Filed Vitals:   07/26/14 1840  BP: 111/64  Pulse: 66  Temp: 36.4 C  Resp: 15    Post-op Vital Signs: stable   Complications: No apparent anesthesia complications

## 2014-07-26 NOTE — Anesthesia Procedure Notes (Addendum)
Spinal Patient location during procedure: OR Start time: 07/26/2014 4:10 PM End time: 07/26/2014 4:14 PM Reason for block: at surgeon's request Staffing Resident/CRNA: Anne Fu Performed by: resident/CRNA  Preanesthetic Checklist Completed: patient identified, site marked, surgical consent, pre-op evaluation, timeout performed, IV checked, risks and benefits discussed, monitors and equipment checked and at surgeon's request Spinal Block Patient position: sitting Approach: left paramedian Location: L2-3 Injection technique: single-shot Needle Needle type: Spinocan  Needle gauge: 22 G Needle length: 9 cm Assessment Sensory level: T6 Additional Notes Expiration date of kit checked and confirmed. Patient tolerated procedure well, without complications. X 2 attempt with noted clear CSF return. Loss of motor and sensory on exam post injection. First attempt right paramedian and converted to left with noted CSF return.   Procedure Name: MAC Date/Time: 07/26/2014 4:06 PM Performed by: Carleene Cooper A Pre-anesthesia Checklist: Patient identified, Timeout performed, Emergency Drugs available, Suction available and Patient being monitored Patient Re-evaluated:Patient Re-evaluated prior to inductionOxygen Delivery Method: Simple face mask Dental Injury: Teeth and Oropharynx as per pre-operative assessment

## 2014-07-26 NOTE — Anesthesia Preprocedure Evaluation (Signed)
Anesthesia Evaluation  Patient identified by MRN, date of birth, ID band Patient awake    Reviewed: Allergy & Precautions, H&P , NPO status , Patient's Chart, lab work & pertinent test results  Airway Mallampati: II  TM Distance: >3 FB Neck ROM: full    Dental no notable dental hx. (+) Teeth Intact, Dental Advisory Given   Pulmonary neg pulmonary ROS,  breath sounds clear to auscultation  Pulmonary exam normal       Cardiovascular Exercise Tolerance: Good hypertension, Pt. on medications Rhythm:regular Rate:Normal  History CP.  None recently   Neuro/Psych negative neurological ROS  negative psych ROS   GI/Hepatic negative GI ROS, Neg liver ROS, GERD-  Medicated and Controlled,  Endo/Other  negative endocrine ROS  Renal/GU negative Renal ROS  negative genitourinary   Musculoskeletal   Abdominal   Peds  Hematology negative hematology ROS (+)   Anesthesia Other Findings   Reproductive/Obstetrics negative OB ROS                             Anesthesia Physical Anesthesia Plan  ASA: II  Anesthesia Plan: Spinal   Post-op Pain Management:    Induction:   Airway Management Planned:   Additional Equipment:   Intra-op Plan:   Post-operative Plan:   Informed Consent: I have reviewed the patients History and Physical, chart, labs and discussed the procedure including the risks, benefits and alternatives for the proposed anesthesia with the patient or authorized representative who has indicated his/her understanding and acceptance.   Dental Advisory Given  Plan Discussed with: CRNA and Surgeon  Anesthesia Plan Comments:         Anesthesia Quick Evaluation

## 2014-07-27 LAB — CBC
HEMATOCRIT: 31.4 % — AB (ref 36.0–46.0)
Hemoglobin: 11 g/dL — ABNORMAL LOW (ref 12.0–15.0)
MCH: 30.8 pg (ref 26.0–34.0)
MCHC: 35 g/dL (ref 30.0–36.0)
MCV: 88 fL (ref 78.0–100.0)
Platelets: 159 10*3/uL (ref 150–400)
RBC: 3.57 MIL/uL — ABNORMAL LOW (ref 3.87–5.11)
RDW: 12.7 % (ref 11.5–15.5)
WBC: 8.8 10*3/uL (ref 4.0–10.5)

## 2014-07-27 LAB — BASIC METABOLIC PANEL
Anion gap: 6 (ref 5–15)
BUN: 16 mg/dL (ref 6–23)
CALCIUM: 8.2 mg/dL — AB (ref 8.4–10.5)
CO2: 24 mmol/L (ref 19–32)
Chloride: 106 mmol/L (ref 96–112)
Creatinine, Ser: 0.68 mg/dL (ref 0.50–1.10)
GFR, EST NON AFRICAN AMERICAN: 84 mL/min — AB (ref >90)
Glucose, Bld: 141 mg/dL — ABNORMAL HIGH (ref 70–99)
POTASSIUM: 4 mmol/L (ref 3.5–5.1)
SODIUM: 136 mmol/L (ref 135–145)

## 2014-07-27 MED ORDER — OXYCODONE HCL 5 MG PO TABS: 5.0000 mg | ORAL_TABLET | ORAL | Status: DC | PRN

## 2014-07-27 MED ORDER — RIVAROXABAN 10 MG PO TABS: 10.0000 mg | ORAL_TABLET | Freq: Every day | ORAL | Status: DC

## 2014-07-27 MED ORDER — TRAMADOL HCL 50 MG PO TABS
50.0000 mg | ORAL_TABLET | Freq: Four times a day (QID) | ORAL | Status: DC | PRN
Start: 2014-07-27 — End: 2014-11-17

## 2014-07-27 MED ORDER — METHOCARBAMOL 500 MG PO TABS: 500.0000 mg | ORAL_TABLET | Freq: Four times a day (QID) | ORAL | Status: DC | PRN

## 2014-07-27 MED ORDER — OMEPRAZOLE 20 MG PO CPDR
20.0000 mg | DELAYED_RELEASE_CAPSULE | Freq: Every day | ORAL | Status: DC
  Administered 2014-07-27 – 2014-07-28 (×2): 20 mg via ORAL
  Filled 2014-07-27 (×2): qty 1

## 2014-07-27 MED ORDER — SODIUM CHLORIDE 0.9 % IV BOLUS (SEPSIS)
250.0000 mL | Freq: Once | INTRAVENOUS | Status: AC
  Administered 2014-07-27: 250 mL via INTRAVENOUS

## 2014-07-27 NOTE — Evaluation (Signed)
Occupational Therapy Evaluation Patient Details Name: NAYELIZ HIPP MRN: [id number] DOB: [birth year] Today's Date: 07/27/2014    History of Present Illness 74 yo female s/p R THA 07/26/14. Hx of HTN, breast cancer.    Clinical Impression   Pt moves well with min guard assist for toilet transfers and walker use. Min assist for LB self care. Will follow while on acute to progress ADL and safety.    Follow Up Recommendations  No OT follow up;Supervision/Assistance - 24 hour    Equipment Recommendations  None recommended by OT    Recommendations for Other Services       Precautions / Restrictions Precautions Precautions: Fall Restrictions Weight Bearing Restrictions: No RLE Weight Bearing: Weight bearing as tolerated      Mobility Bed Mobility           Transfers Overall transfer level: Needs assistance Equipment used: Rolling walker (2 wheeled) Transfers: Sit to/from Stand Sit to Stand: Min guard         General transfer comment: cues for hand placement.    Balance                                            ADL Overall ADL's : Needs assistance/impaired Eating/Feeding: Independent;Sitting   Grooming: Wash/dry hands;Standing;Min guard   Upper Body Bathing: Set up;Sitting   Lower Body Bathing: Minimal assistance;Sit to/from stand   Upper Body Dressing : Set up;Sitting   Lower Body Dressing: Minimal assistance;Sit to/from stand   Toilet Transfer: Min guard;Ambulation;BSC;RW   Toileting- Water quality scientist and Hygiene: Min guard;Sit to/from stand         General ADL Comments: Pt has a reacher and long handled shoe horn and explained options for using with LB dressing versus husband assisting. Pt moving well and min cues to go a bit slower and for walker sequence. Explained options for using 3in1 in tub facing the outside of the tub versus sponge bathing a few days and stepping into tub when [facility] arrives. Pt states she is ok to sponge  bathe initially. Educated on sequence for LB dressing and having walker in front of her when she stands to manage clothing. she has a 3in1 already.      Vision     Perception     Praxis      Pertinent Vitals/Pain Pain Assessment: No/denies pain Pain Score: 3  Pain Location: R hip Pain Descriptors / Indicators: Aching;Sore Pain Intervention(s): Monitored during session;Repositioned;Ice applied     Hand Dominance     Extremity/Trunk Assessment Upper Extremity Assessment Upper Extremity Assessment: Overall WFL for tasks assessed      Cervical / Trunk Assessment Cervical / Trunk Assessment: Normal   Communication Communication Communication: No difficulties   Cognition Arousal/Alertness: Awake/alert Behavior During Therapy: WFL for tasks assessed/performed Overall Cognitive Status: Within Functional Limits for tasks assessed                     General Comments       Exercises       Shoulder Instructions      Home Living Family/patient expects to be discharged to:: Private residence Living Arrangements: Spouse/significant other   Type of Home: House Home Access: Stairs to enter [employer] of Steps: 3 Entrance Stairs-Rails: None (post nearby) Home Layout: One level     Bathroom Shower/Tub: Tub/shower unit   [employer]  Toilet: Standard     Home Equipment: Walker - 2 wheels;Bedside commode;Adaptive equipment Adaptive Equipment: Reacher;Long-handled shoe horn        Prior Functioning/Environment Level of Independence: Independent             OT Diagnosis: Generalized weakness   OT Problem List: Decreased knowledge of use of DME or AE;Decreased strength   OT Treatment/Interventions: Self-care/ADL training;Patient/family education;Therapeutic activities;DME and/or AE instruction    OT Goals(Current goals can be found in the care plan section) Acute Rehab OT Goals Patient Stated Goal: regain indepencence. less pain OT Goal  Formulation: With patient Time For Goal Achievement: 08/03/14 Potential to Achieve Goals: Good  OT Frequency: Min 2X/week   Barriers to D/C:            Co-evaluation              End of Session Equipment Utilized During Treatment: Rolling walker  Activity Tolerance: Patient tolerated treatment well Patient left: in chair;with call bell/phone within reach   Time: 1240-1301 OT Time Calculation (min): 21 min Charges:  OT General Charges $OT Visit: 1 Procedure OT Evaluation $Initial OT Evaluation Tier I: 1 Procedure G-Codes:    Jules Schick  [phone] 07/27/2014, 1:49 PM

## 2014-07-27 NOTE — Evaluation (Signed)
Physical Therapy Evaluation Patient Details Name: Ashley Holland MRN: [id number] DOB: 24-Jan-1941 Today's Date: 07/27/2014   History of Present Illness  74 yo female s/p R THA 07/26/14. Hx of HTN, breast cancer.   Clinical Impression  On eval, pt required Min guard assist for mobility-able to ambulate ~150' with RW. Anticipate pt will progress well during stay.     Follow Up Recommendations Home health PT    Equipment Recommendations  None recommended by PT    Recommendations for Other Services OT consult     Precautions / Restrictions Precautions Precautions: Fall Restrictions Weight Bearing Restrictions: No RLE Weight Bearing: Weight bearing as tolerated      Mobility  Bed Mobility Overal bed mobility: Needs Assistance Bed Mobility: Supine to Sit     Supine to sit: Min guard     General bed mobility comments: close guard for safety  Transfers Overall transfer level: Needs assistance Equipment used: Rolling walker (2 wheeled) Transfers: Sit to/from Stand Sit to Stand: Min guard         General transfer comment: close guard for safety. VCs safety, hand placement  Ambulation/Gait Ambulation/Gait assistance: Min guard Ambulation Distance (Feet): 150 Feet Assistive device: Rolling walker (2 wheeled) Gait Pattern/deviations: Step-through pattern;Decreased stride length;Decreased step length - right;Antalgic     General Gait Details: close guard for safety.   Stairs            Wheelchair Mobility    Modified Rankin (Stroke Patients Only)       Balance                                             Pertinent Vitals/Pain Pain Assessment: 0-10 Pain Score: 3  Pain Location: R hip Pain Descriptors / Indicators: Aching;Sore Pain Intervention(s): Monitored during session;Repositioned;Ice applied    Home Living Family/patient expects to be discharged to:: Private residence Living Arrangements: Spouse/significant other   Type of  Home: House Home Access: Stairs to enter Entrance Stairs-Rails: None (post nearby) Technical brewer of Steps: 3 Home Layout: One level Home Equipment: Environmental consultant - 2 wheels;Bedside commode      Prior Function Level of Independence: Independent               Hand Dominance        Extremity/Trunk Assessment   Upper Extremity Assessment: Defer to OT evaluation           Lower Extremity Assessment: RLE deficits/detail RLE Deficits / Details: (at least) knee ext 3/5, hip flex 3/5, moves ankle well    Cervical / Trunk Assessment: Normal  Communication   Communication: No difficulties  Cognition Arousal/Alertness: Awake/alert Behavior During Therapy: WFL for tasks assessed/performed Overall Cognitive Status: Within Functional Limits for tasks assessed                      General Comments      Exercises Total Joint Exercises Ankle Circles/Pumps: AROM;Both;10 reps;Seated Quad Sets: AROM;Both;10 reps;Seated Heel Slides: AAROM;Right;10 reps;Seated Hip ABduction/ADduction: AAROM;Right;10 reps;Seated Long Arc Quad: AROM;Right;10 reps;Seated      Assessment/Plan    PT Assessment Patient needs continued PT services  PT Diagnosis Difficulty walking;Acute pain   PT Problem List Decreased strength;Decreased range of motion;Decreased activity tolerance;Decreased balance;Decreased mobility;Pain;Decreased knowledge of use of DME  PT Treatment Interventions DME instruction;Gait training;Stair training;Functional mobility training;Therapeutic activities;Therapeutic exercise;Balance training;Patient/family education  PT Goals (Current goals can be found in the Care Plan section) Acute Rehab PT Goals Patient Stated Goal: regain indepencence. less pain PT Goal Formulation: With patient Time For Goal Achievement: 08/03/14 Potential to Achieve Goals: Good    Frequency 7X/week   Barriers to discharge        Co-evaluation               End of Session  Equipment Utilized During Treatment: Gait belt Activity Tolerance: Patient tolerated treatment well Patient left: in chair;with call bell/phone within reach           Time: 1216-2446 PT Time Calculation (min) (ACUTE ONLY): 20 min   Charges:   PT Evaluation $Initial PT Evaluation Tier I: 1 Procedure     PT G Codes:        [place]on Anna, MPT Pager: [phone]

## 2014-07-27 NOTE — Progress Notes (Signed)
Physical Therapy Treatment Patient Details Name: MARAKI MACQUARRIE MRN: [id number] DOB: January 07, 1941 Today's Date: 07/27/2014    History of Present Illness 74 yo female s/p R THA 07/26/14. Hx of HTN, breast cancer.     PT Comments    Progressing well with mobility.   Follow Up Recommendations  Home health PT     Equipment Recommendations  None recommended by PT    Recommendations for Other Services       Precautions / Restrictions Precautions Precautions: Fall Restrictions Weight Bearing Restrictions: No RLE Weight Bearing: Weight bearing as tolerated    Mobility  Bed Mobility               General bed mobility comments: pt oob in recliner  Transfers Overall transfer level: Needs assistance Equipment used: Rolling walker (2 wheeled) Transfers: Sit to/from Stand Sit to Stand: Min guard         General transfer comment: cues for hand placement. close guard for safety  Ambulation/Gait Ambulation/Gait assistance: Min guard Ambulation Distance (Feet): 200 Feet Assistive device: Rolling walker (2 wheeled) Gait Pattern/deviations: Step-through pattern;Decreased stride length;Decreased step length - right     General Gait Details: close guard for safety.    Stairs            Wheelchair Mobility    Modified Rankin (Stroke Patients Only)       Balance                                    Cognition Arousal/Alertness: Awake/alert Behavior During Therapy: WFL for tasks assessed/performed Overall Cognitive Status: Within Functional Limits for tasks assessed                      Exercises      General Comments        Pertinent Vitals/Pain Pain Assessment: 0-10 Pain Score: 2  Pain Location: R hip Pain Descriptors / Indicators: Sore Pain Intervention(s): Monitored during session;Ice applied    Home Living Family/patient expects to be discharged to:: Private residence Living Arrangements: Spouse/significant other   Type  of Home: House Home Access: Stairs to enter Entrance Stairs-Rails: None (post nearby) Home Layout: One level Home Equipment: Environmental consultant - 2 wheels;Bedside commode;Adaptive equipment      Prior Function Level of Independence: Independent          PT Goals (current goals can now be found in the care plan section) Acute Rehab PT Goals Patient Stated Goal: regain indepencence. less pain Progress towards PT goals: Progressing toward goals    Frequency  7X/week    PT Plan Current plan remains appropriate    Co-evaluation             End of Session   Activity Tolerance: Patient tolerated treatment well Patient left: in chair;with call bell/phone within reach;with family/visitor present     Time: 1441-1456 PT Time Calculation (min) (ACUTE ONLY): 15 min  Charges:  $Gait Training: 8-22 mins                    G Codes:      Weston Anna, MPT Pager: [phone]

## 2014-07-27 NOTE — Progress Notes (Signed)
Utilization review completed.  

## 2014-07-27 NOTE — Care Management Note (Signed)
    Page 1 of 2   07/27/2014     3:12:57 PM CARE MANAGEMENT NOTE 07/27/2014  Patient:  Ashley Holland, Ashley Holland   Account Number:  [id number]  Date Initiated:  07/27/2014  Documentation initiated by:  [facility]  Subjective/Objective Assessment:   adm: RIGHT TOTAL HIP ARTHROPLASTY ANTERIOR APPROACH (Right)     Action/Plan:   discharge planning   Anticipated DC Date:  07/27/2014   Anticipated DC Plan:  [place]  CM consult      [facility] Choice  HOME HEALTH   Choice offered to / List presented to:  C-1 Patient   DME arranged  Vassie Moselle      DME agency  [place] arranged  [place]   Status of service:  Completed, signed off Medicare Important Message given?   (If response is "NO", the following Medicare IM given date fields will be blank) Date Medicare IM given:   Medicare IM given by:   Date Additional Medicare IM given:   Additional Medicare IM given by:    Discharge Disposition:  [place]  Per UR Regulation:    If discussed at Long Length of Stay Meetings, dates discussed:    Comments:  07/27/14 15:10 CM met with pt in room to offer choice of home health agency.  Pt chooses [facility] to render HHPT.  Address and contact information verified by pt.  Referral emailed to [employer], Tim.  CM called [facility] DME rep, Lecretia to please deliver rolling walker to room prior to discharge. No other CM needs were communicated.  Mariane Masters, BSN, [place].

## 2014-07-27 NOTE — Progress Notes (Signed)
   Subjective: 1 Day Post-Op Procedure(s) (LRB): RIGHT TOTAL HIP ARTHROPLASTY ANTERIOR APPROACH (Right) Patient reports pain as mild.   Patient seen in rounds with Dr. Wynelle Link.  Doing well this morning.  Briefly discussed surgical findings with Dr. Wynelle Link. Patient is well, and has had no acute complaints or problems We will start therapy today.  Plan is to go Home after hospital stay.  Objective: Vital signs in last 24 hours: Temp:  [96.4 F (35.8 C)-98 F (36.7 C)] 97.5 F (36.4 C) (04/05 0458) Pulse Rate:  [57-94] 59 (04/05 0458) Resp:  [13-19] 16 (04/05 0458) BP: (98-153)/(52-91) 112/67 mmHg (04/05 0458) SpO2:  [100 %] 100 % (04/05 0458) Weight:  [68.493 kg (151 lb)] 68.493 kg (151 lb) (04/04 1357)  Intake/Output from previous day:  Intake/Output Summary (Last 24 hours) at 07/27/14 0839 Last data filed at 07/27/14 0700  Gross per 24 hour  Intake   3850 ml  Output   1851 ml  Net   1999 ml    Intake/Output this shift: UOP only 175 since around [place]  Labs:  Recent Labs  07/27/14 0450  HGB 11.0*    Recent Labs  07/27/14 0450  WBC 8.8  RBC 3.57*  HCT 31.4*  PLT 159    Recent Labs  07/27/14 0450  NA 136  K 4.0  CL 106  CO2 24  BUN 16  CREATININE 0.68  GLUCOSE 141*  CALCIUM 8.2*   No results for input(s): LABPT, INR in the last 72 hours.  EXAM General - Patient is Alert, Appropriate and Oriented Extremity - Neurovascular intact Sensation intact distally Dorsiflexion/Plantar flexion intact Dressing - dressing C/D/I Motor Function - intact, moving foot and toes well on exam.  Hemovac pulled without difficulty.  Past Medical History  Diagnosis Date  . Unspecified essential hypertension   . Precordial pain     " no recent problems"  . Other and unspecified hyperlipidemia   . Rectal prolapse     stool softeners helpful.  . Female bladder prolapse   . Enlarged thyroid     "was told had nodules" - no swallowing problems  . GERD  (gastroesophageal reflux disease)     " occ. episodes of regurgitation with vomiting"- has improved with Omeprazole use  . Arthritis     Per medical history form dated 11/04/09.past history DJD back  . Malignant neoplasm of breast (female), unspecified site     right breast cancer. surgery, radiation, oral meds- Arimidex.    Assessment/Plan: 1 Day Post-Op Procedure(s) (LRB): RIGHT TOTAL HIP ARTHROPLASTY ANTERIOR APPROACH (Right) Principal Problem:   OA (osteoarthritis) of hip  Estimated body mass index is 25.91 kg/(m^2) as calculated from the following:   Height as of this encounter: 5\' 4"  (1.626 m).   Weight as of this encounter: 68.493 kg (151 lb). Advance diet Up with therapy Plan for discharge tomorrow Discharge home with home health  Give additional fluids this morning and monitor UOP  DVT Prophylaxis - Xarelto Weight Bearing As Tolerated right Leg Hemovac Pulled Begin Therapy  Patient already has a RW and a 3-in-1.  [name], PA-C Orthopaedic Surgery 07/27/2014, 8:39 AM

## 2014-07-27 NOTE — Discharge Summary (Signed)
Physician Discharge Summary   Patient ID: Ashley Holland MRN: [id number] DOB/AGE: [birth year] 74 y.o.  Admit date: 07/26/2014 Discharge date: 07/28/2014  Primary Diagnosis:  Osteoarthritis of the Right hip.   Admission Diagnoses:  Past Medical History  Diagnosis Date  . Unspecified essential hypertension   . Precordial pain     " no recent problems"  . Other and unspecified hyperlipidemia   . Rectal prolapse     stool softeners helpful.  . Female bladder prolapse   . Enlarged thyroid     "was told had nodules" - no swallowing problems  . GERD (gastroesophageal reflux disease)     " occ. episodes of regurgitation with vomiting"- has improved with Omeprazole use  . Arthritis     Per medical history form dated 11/04/09.past history DJD back  . Malignant neoplasm of breast (female), unspecified site     right breast cancer. surgery, radiation, oral meds- Arimidex.   Discharge Diagnoses:   Principal Problem:   OA (osteoarthritis) of hip  Estimated body mass index is 25.91 kg/(m^2) as calculated from the following:   Height as of this encounter: 5' 4"  (1.626 m).   Weight as of this encounter: 68.493 kg (151 lb).  Procedure(s) (LRB): RIGHT TOTAL HIP ARTHROPLASTY ANTERIOR APPROACH (Right)   Consults: None  HPI: Ashley Holland is a 74 y.o. female who has advanced end-  stage arthritis of her Right hip with progressively worsening pain and  dysfunction.The patient has failed nonoperative management and presents for  total hip arthroplasty.  Laboratory Data: Admission on 07/26/2014  Component Date Value Ref Range Status  . WBC 07/27/2014 8.8  4.0 - 10.5 K/uL Final  . RBC 07/27/2014 3.57* 3.87 - 5.11 MIL/uL Final  . Hemoglobin 07/27/2014 11.0* 12.0 - 15.0 g/dL Final  . HCT 07/27/2014 31.4* 36.0 - 46.0 % Final  . MCV 07/27/2014 88.0  78.0 - 100.0 fL Final  . MCH 07/27/2014 30.8  26.0 - 34.0 pg Final  . MCHC 07/27/2014 35.0  30.0 - 36.0 g/dL Final  . RDW 07/27/2014 12.7   11.5 - 15.5 % Final  . Platelets 07/27/2014 159  150 - 400 K/uL Final  . Sodium 07/27/2014 136  135 - 145 mmol/L Final  . Potassium 07/27/2014 4.0  3.5 - 5.1 mmol/L Final  . Chloride 07/27/2014 106  96 - 112 mmol/L Final  . CO2 07/27/2014 24  19 - 32 mmol/L Final  . Glucose, Bld 07/27/2014 141* 70 - 99 mg/dL Final  . BUN 07/27/2014 16  6 - 23 mg/dL Final  . Creatinine, Ser 07/27/2014 0.68  0.50 - 1.10 mg/dL Final  . Calcium 07/27/2014 8.2* 8.4 - 10.5 mg/dL Final  . GFR calc non Af Amer 07/27/2014 84* >90 mL/min Final  . GFR calc Af Amer 07/27/2014 >90  >90 mL/min Final   Comment: (NOTE) The eGFR has been calculated using the CKD EPI equation. This calculation has not been validated in all clinical situations. eGFR's persistently <90 mL/min signify possible Chronic Kidney Disease.   Georgiann Hahn gap 07/27/2014 6  5 - 15 Final  Hospital Outpatient Visit on 07/14/2014  Component Date Value Ref Range Status  . MRSA, PCR 07/14/2014 NEGATIVE  NEGATIVE Final  . Staphylococcus aureus 07/14/2014 NEGATIVE  NEGATIVE Final   Comment:        The Xpert SA Assay (FDA approved for NASAL specimens in patients over 70 years of age), is one component of a comprehensive surveillance program.  Test performance has been  validated by [facility] for patients greater than or equal to 57 year old. It is not intended to diagnose infection nor to guide or monitor treatment.   [name] aPTT 07/14/2014 34  24 - 37 seconds Final  . Prothrombin Time 07/14/2014 13.9  11.6 - 15.2 seconds Final  . INR 07/14/2014 1.05  0.00 - 1.49 Final  . ABO/RH(D) 07/14/2014 O POS   Final  . Antibody Screen 07/14/2014 NEG   Final  . Sample Expiration 07/14/2014 07/24/2014   Final  . Color, Urine 07/14/2014 YELLOW  YELLOW Final  . APPearance 07/14/2014 CLEAR  CLEAR Final  . Specific Gravity, Urine 07/14/2014 1.010  1.005 - 1.030 Final  . pH 07/14/2014 6.0  5.0 - 8.0 Final  . Glucose, UA 07/14/2014 NEGATIVE  NEGATIVE mg/dL Final  .  Hgb urine dipstick 07/14/2014 NEGATIVE  NEGATIVE Final  . Bilirubin Urine 07/14/2014 NEGATIVE  NEGATIVE Final  . Ketones, ur 07/14/2014 NEGATIVE  NEGATIVE mg/dL Final  . Protein, ur 07/14/2014 NEGATIVE  NEGATIVE mg/dL Final  . Urobilinogen, UA 07/14/2014 1.0  0.0 - 1.0 mg/dL Final  . Nitrite 07/14/2014 NEGATIVE  NEGATIVE Final  . Leukocytes, UA 07/14/2014 TRACE* NEGATIVE Final  . Squamous Epithelial / LPF 07/14/2014 RARE  RARE Final  . WBC, UA 07/14/2014 0-2  <3 WBC/hpf Final  . RBC / HPF 07/14/2014 0-2  <3 RBC/hpf Final  . Bacteria, UA 07/14/2014 RARE  RARE Final  . ABO/RH(D) 07/14/2014 O POS   Final     X-Rays:Dg Pelvis Portable  07/26/2014   CLINICAL DATA:  Total right hip arthroplasty  EXAM: DG C-ARM 1-60 MIN - NRPT [facility]; PORTABLE PELVIS 1-2 VIEWS  COMPARISON:  None.  FINDINGS: The femoral and acetabular components are well seated. No complicating features are demonstrated. The visualized bony pelvis is intact.  IMPRESSION: Well seated components of a total right hip arthroplasty.   Electronically Signed   By: Marijo Sanes M.D.   On: 07/26/2014 18:18   Dg C-arm 1-60 Min-no Report  07/26/2014   CLINICAL DATA:  Total right hip arthroplasty  EXAM: DG C-ARM 1-60 MIN - [facility]PT [facility]; PORTABLE PELVIS 1-2 VIEWS  COMPARISON:  None.  FINDINGS: The femoral and acetabular components are well seated. No complicating features are demonstrated. The visualized bony pelvis is intact.  IMPRESSION: Well seated components of a total right hip arthroplasty.   Electronically Signed   By: Marijo Sanes M.D.   On: 07/26/2014 18:18    EKG: Orders placed or performed in visit on 05/19/14  . EKG 12-Lead     Hospital Course: Patient was admitted to [facility] and taken to the OR and underwent the above state procedure without complications.  Patient tolerated the procedure well and was later transferred to the recovery room and then to the orthopaedic floor for postoperative care.  They were given PO and IV  analgesics for pain control following their surgery.  They were given 24 hours of postoperative antibiotics of  Anti-infectives    Start     Dose/Rate Route Frequency Ordered Stop   07/27/14 0600  ceFAZolin (ANCEF) IVPB 2 g/50 mL premix     2 g 100 mL/hr over 30 Minutes Intravenous On call to O.R. 07/26/14 1353 07/26/14 1617   07/26/14 2200  ceFAZolin (ANCEF) IVPB 2 g/50 mL premix     2 g 100 mL/hr over 30 Minutes Intravenous Every 6 hours 07/26/14 1842 07/27/14 0346     and started on DVT prophylaxis in the  form of Xarelto.   PT and OT were ordered for total hip protocol.  The patient was allowed to be WBAT with therapy. Discharge planning was consulted to help with postop disposition and equipment needs.  Patient had a decent night on the evening of surgery.  They started to get up OOB with therapy on day one.  Hemovac drain was pulled without difficulty.  Continued to work with therapy into day two.  Dressing was changed on day two and the incision was Healing well. Patient was seen in rounds and was ready to go home.  Up with therapy Discharge home with home health Diet - Cardiac diet Follow up - in 2 weeks Activity - WBAT Disposition - Home Condition Upon Discharge - Good D/C Meds - See DC Summary DVT Prophylaxis - Xarelto      Discharge Instructions    Call MD / Call 911    Complete by:  As directed   If you experience chest pain or shortness of breath, CALL 911 and be transported to the hospital emergency room.  If you develope a fever above 101 F, pus (white drainage) or increased drainage or redness at the wound, or calf pain, call your surgeon's office.     Change dressing    Complete by:  As directed   You may change your dressing dressing daily with sterile 4 x 4 inch gauze dressing and paper tape.  Do not submerge the incision under water.     Constipation Prevention    Complete by:  As directed   Drink plenty of fluids.  Prune juice may be helpful.  You may use a stool  softener, such as Colace (over the counter) 100 mg twice a day.  Use MiraLax (over the counter) for constipation as needed.     Diet - low sodium heart healthy    Complete by:  As directed      Discharge instructions    Complete by:  As directed   Pick up stool softner and laxative for home use following surgery while on pain medications. Do not submerge incision under water. Please use good hand washing techniques while changing dressing each day. May shower starting three days after surgery. Please use a clean towel to pat the incision dry following showers. Continue to use ice for pain and swelling after surgery. Do not use any lotions or creams on the incision until instructed by your surgeon.  Total Hip Protocol.  Take Xarelto for two and a half more weeks, then discontinue Xarelto. Once the patient has completed the Xarelto, they may resume the 81 mg Aspirin.  Postoperative Constipation Protocol  Constipation - defined medically as fewer than three stools per week and severe constipation as less than one stool per week.  One of the most common issues patients have following surgery is constipation.  Even if you have a regular bowel pattern at home, your normal regimen is likely to be disrupted due to multiple reasons following surgery.  Combination of anesthesia, postoperative narcotics, change in appetite and fluid intake all can affect your bowels.  In order to avoid complications following surgery, here are some recommendations in order to help you during your recovery period.  Colace (docusate) - Pick up an over-the-counter form of Colace or another stool softener and take twice a day as long as you are requiring postoperative pain medications.  Take with a full glass of water daily.  If you experience loose stools or diarrhea, hold the colace  until you stool forms back up.  If your symptoms do not get better within 1 week or if they get worse, check with your doctor.  Dulcolax  (bisacodyl) - Pick up over-the-counter and take as directed by the product packaging as needed to assist with the movement of your bowels.  Take with a full glass of water.  Use this product as needed if not relieved by Colace only.   MiraLax (polyethylene glycol) - Pick up over-the-counter to have on hand.  MiraLax is a solution that will increase the amount of water in your bowels to assist with bowel movements.  Take as directed and can mix with a glass of water, juice, soda, coffee, or tea.  Take if you go more than two days without a movement. Do not use MiraLax more than once per day. Call your doctor if you are still constipated or irregular after using this medication for 7 days in a row.  If you continue to have problems with postoperative constipation, please contact the office for further assistance and recommendations.  If you experience "the worst abdominal pain ever" or develop nausea or vomiting, please contact the office immediatly for further recommendations for treatment.     Do not sit on low chairs, stoools or toilet seats, as it may be difficult to get up from low surfaces    Complete by:  As directed      Driving restrictions    Complete by:  As directed   No driving until released by the physician.     Increase activity slowly as tolerated    Complete by:  As directed      Lifting restrictions    Complete by:  As directed   No lifting until released by the physician.     Patient may shower    Complete by:  As directed   You may shower without a dressing once there is no drainage.  Do not wash over the wound.  If drainage remains, do not shower until drainage stops.     TED hose    Complete by:  As directed   Use stockings (TED hose) for 3 weeks on both leg(s).  You may remove them at night for sleeping.     Weight bearing as tolerated    Complete by:  As directed   Laterality:  right  Extremity:  Lower            Medication List    STOP taking these medications         aspirin 81 MG tablet     CALCIUM 600+D 600-200 MG-UNIT Tabs  Generic drug:  Calcium Carbonate-Vitamin D     multivitamin tablet      TAKE these medications        anastrozole 1 MG tablet  Commonly known as:  ARIMIDEX  Take 1 mg by mouth daily.     docusate sodium 100 MG capsule  Commonly known as:  COLACE  Take 200 mg by mouth at bedtime.     methocarbamol 500 MG tablet  Commonly known as:  ROBAXIN  Take 1 tablet (500 mg total) by mouth every 6 (six) hours as needed for muscle spasms.     olmesartan-hydrochlorothiazide 40-12.5 MG per tablet  Commonly known as:  BENICAR HCT  Take 1 tablet by mouth daily.     omeprazole 20 MG capsule  Commonly known as:  PRILOSEC  Take 20 mg by mouth daily.     oxyCODONE 5  MG immediate release tablet  Commonly known as:  Oxy IR/ROXICODONE  Take 1-2 tablets (5-10 mg total) by mouth every 3 (three) hours as needed for moderate pain, severe pain or breakthrough pain.     rivaroxaban 10 MG Tabs tablet  Commonly known as:  XARELTO  - Take 1 tablet (10 mg total) by mouth daily with breakfast. Take Xarelto for two and a half more weeks, then discontinue Xarelto.  - Once the patient has completed the Xarelto, they may resume the 81 mg Aspirin.     simvastatin 10 MG tablet  Commonly known as:  ZOCOR  Take 1 tablet (10 mg total) by mouth at bedtime.     traMADol 50 MG tablet  Commonly known as:  ULTRAM  Take 1-2 tablets (50-100 mg total) by mouth every 6 (six) hours as needed (mild pain).       Follow-up Information    Follow up with [facility].   Why:  home health physical therapy   Contact information:   [address] [place] [address] [phone]       Follow up with [facility].   Why:  rolling walker   Contact information:   [address] [place] [address] [phone]       Follow up with [name], MD. Schedule an appointment as soon as possible for a  visit on 08/10/2014.   Specialty:  Orthopedic Surgery   Why:  Call office at [phone] to setup appointment in two weeks with Dr. Wynelle Link.   Contact information:   [address] [place] [address] [phone]       Signed: [name], PA-C Orthopaedic Surgery 07/27/2014, 7:52 PM

## 2014-07-27 NOTE — Discharge Instructions (Addendum)
Dr. Gaynelle Arabian Total Joint Specialist [facility] [address] [place], [place] [address] [phone]  ANTERIOR APPROACH TOTAL HIP REPLACEMENT POSTOPERATIVE DIRECTIONS   Hip Rehabilitation, Guidelines Following Surgery  The results of a hip operation are greatly improved after range of motion and muscle strengthening exercises. Follow all safety measures which are given to protect your hip. If any of these exercises cause increased pain or swelling in your joint, decrease the amount until you are comfortable again. Then slowly increase the exercises. Call your caregiver if you have problems or questions.   HOME CARE INSTRUCTIONS  Remove items at home which could result in a fall. This includes throw rugs or furniture in walking pathways.   ICE to the affected hip every three hours for 30 minutes at a time and then as needed for pain and swelling.  Continue to use ice on the hip for pain and swelling from surgery. You may notice swelling that will progress down to the foot and ankle.  This is normal after surgery.  Elevate the leg when you are not up walking on it.    Continue to use the breathing machine which will help keep your temperature down.  It is common for your temperature to cycle up and down following surgery, especially at night when you are not up moving around and exerting yourself.  The breathing machine keeps your lungs expanded and your temperature down.  Do not place pillow under knee, focus on keeping the knee straight while resting  DIET You may resume your previous home diet once your are discharged from the hospital.  DRESSING / WOUND CARE / SHOWERING You may change your dressing 3-5 days after surgery.  Then change the dressing every day with sterile gauze.  Please use good hand washing techniques before changing the dressing.  Do not use any lotions or creams on the incision until instructed by your surgeon. You may start showering  once you are discharged home but do not submerge the incision under water. Just pat the incision dry and apply a dry gauze dressing on daily. Change the surgical dressing daily and reapply a dry dressing each time.  ACTIVITY Walk with your walker as instructed. Use walker as long as suggested by your caregivers. Avoid periods of inactivity such as sitting longer than an hour when not asleep. This helps prevent blood clots.  You may resume a sexual relationship in one month or when given the OK by your doctor.  You may return to work once you are cleared by your doctor.  Do not drive a car for 6 weeks or until released by you surgeon.  Do not drive while taking narcotics.  WEIGHT BEARING Weight bearing as tolerated with assist device (walker, cane, etc) as directed, use it as long as suggested by your surgeon or therapist, typically at least 4-6 weeks.  POSTOPERATIVE CONSTIPATION PROTOCOL Constipation - defined medically as fewer than three stools per week and severe constipation as less than one stool per week.  One of the most common issues patients have following surgery is constipation.  Even if you have a regular bowel pattern at home, your normal regimen is likely to be disrupted due to multiple reasons following surgery.  Combination of anesthesia, postoperative narcotics, change in appetite and fluid intake all can affect your bowels.  In order to avoid complications following surgery, here are some recommendations in order to help you during your recovery period.  Colace (docusate) - Pick  up an over-the-counter form of Colace or another stool softener and take twice a day as long as you are requiring postoperative pain medications.  Take with a full glass of water daily.  If you experience loose stools or diarrhea, hold the colace until you stool forms back up.  If your symptoms do not get better within 1 week or if they get worse, check with your doctor. ° °Dulcolax (bisacodyl) - Pick up  over-the-counter and take as directed by the product packaging as needed to assist with the movement of your bowels.  Take with a full glass of water.  Use this product as needed if not relieved by Colace only.  ° °MiraLax (polyethylene glycol) - Pick up over-the-counter to have on hand.  MiraLax is a solution that will increase the amount of water in your bowels to assist with bowel movements.  Take as directed and can mix with a glass of water, juice, soda, coffee, or tea.  Take if you go more than two days without a movement. °Do not use MiraLax more than once per day. Call your doctor if you are still constipated or irregular after using this medication for 7 days in a row. ° °If you continue to have problems with postoperative constipation, please contact the office for further assistance and recommendations.  If you experience "the worst abdominal pain ever" or develop nausea or vomiting, please contact the office immediatly for further recommendations for treatment. ° °ITCHING ° If you experience itching with your medications, try taking only a single pain pill, or even half a pain pill at a time.  You can also use Benadryl over the counter for itching or also to help with sleep.  ° °TED HOSE STOCKINGS °Wear the elastic stockings on both legs for three weeks following surgery during the day but you may remove then at night for sleeping. ° °MEDICATIONS °See your medication summary on the “After Visit Summary” that the nursing staff will review with you prior to discharge.  You may have some home medications which will be placed on hold until you complete the course of blood thinner medication.  It is important for you to complete the blood thinner medication as prescribed by your surgeon.  Continue your approved medications as instructed at time of discharge. ° °PRECAUTIONS °If you experience chest pain or shortness of breath - call 911 immediately for transfer to the hospital emergency department.  °If you  develop a fever greater that 101 F, purulent drainage from wound, increased redness or drainage from wound, foul odor from the wound/dressing, or calf pain - CONTACT YOUR SURGEON.   °                                                °FOLLOW-UP APPOINTMENTS °Make sure you keep all of your appointments after your operation with your surgeon and caregivers. You should call the office at the above phone number and make an appointment for approximately two weeks after the date of your surgery or on the date instructed by your surgeon outlined in the "After Visit Summary". ° °RANGE OF MOTION AND STRENGTHENING EXERCISES  °These exercises are designed to help you keep full movement of your hip joint. Follow your caregiver's or physical therapist's instructions. Perform all exercises about fifteen times, three times per day or as directed. Exercise both hips,   even if you have had only one joint replacement. These exercises can be done on a training (exercise) mat, on the floor, on a table or on a bed. Use whatever works the best and is most comfortable for you. Use music or television while you are exercising so that the exercises are a pleasant break in your day. This will make your life better with the exercises acting as a break in routine you can look forward to.  Lying on your back, slowly slide your foot toward your buttocks, raising your knee up off the floor. Then slowly slide your foot back down until your leg is straight again.  Lying on your back spread your legs as far apart as you can without causing discomfort.  Lying on your side, raise your upper leg and foot straight up from the floor as far as is comfortable. Slowly lower the leg and repeat.  Lying on your back, tighten up the muscle in the front of your thigh (quadriceps muscles). You can do this by keeping your leg straight and trying to raise your heel off the floor. This helps strengthen the largest muscle supporting your knee.  Lying on your back,  tighten up the muscles of your buttocks both with the legs straight and with the knee bent at a comfortable angle while keeping your heel on the floor.   IF YOU ARE TRANSFERRED TO A SKILLED REHAB FACILITY If the patient is transferred to a skilled rehab facility following release from the hospital, a list of the current medications will be sent to the facility for the patient to continue.  When discharged from the skilled rehab facility, please have the facility set up the patient's [place] prior to being released. Also, the skilled facility will be responsible for providing the patient with their medications at time of release from the facility to include their pain medication, the muscle relaxants, and their blood thinner medication. If the patient is still at the rehab facility at time of the two week follow up appointment, the skilled rehab facility will also need to assist the patient in arranging follow up appointment in our office and any transportation needs.  MAKE SURE YOU:  Understand these instructions.  Get help right away if you are not doing well or get worse.    Pick up stool softner and laxative for home use following surgery while on pain medications. Do not submerge incision under water. Please use good hand washing techniques while changing dressing each day. May shower starting three days after surgery. Please use a clean towel to pat the incision dry following showers. Continue to use ice for pain and swelling after surgery. Do not use any lotions or creams on the incision until instructed by your surgeon.   Information on my medicine - XARELTO (Rivaroxaban)  This medication education was reviewed with me or my healthcare representative as part of my discharge preparation.   Why was Xarelto prescribed for you? Xarelto was prescribed for you to reduce the risk of blood clots forming after orthopedic surgery. The medical term for these abnormal blood  clots is venous thromboembolism (VTE).  What do you need to know about xarelto ? Take your Xarelto ONCE DAILY at the same time every day. You may take it either with or without food.  If you have difficulty swallowing the tablet whole, you may crush it and mix in applesauce just prior to taking your dose.  Take Xarelto exactly as prescribed by  your doctor and DO NOT stop taking Xarelto without talking to the doctor who prescribed the medication.  Stopping without other VTE prevention medication to take the place of Xarelto may increase your risk of developing a clot.  After discharge, you should have regular check-up appointments with your healthcare provider that is prescribing your Xarelto.    What do you do if you miss a dose? If you miss a dose, take it as soon as you remember on the same day then continue your regularly scheduled once daily regimen the next day. Do not take two doses of Xarelto on the same day.   Important Safety Information A possible side effect of Xarelto is bleeding. You should call your healthcare provider right away if you experience any of the following: ? Bleeding from an injury or your nose that does not stop. ? Unusual colored urine (red or dark brown) or unusual colored stools (red or black). ? Unusual bruising for unknown reasons. ? A serious fall or if you hit your head (even if there is no bleeding).  Some medicines may interact with Xarelto and might increase your risk of bleeding while on Xarelto. To help avoid this, consult your healthcare provider or pharmacist prior to using any new prescription or non-prescription medications, including herbals, vitamins, non-steroidal anti-inflammatory drugs (NSAIDs) and supplements.  This website has more information on Xarelto: [email or web address]

## 2014-07-28 ENCOUNTER — Encounter (HOSPITAL_COMMUNITY): Payer: Self-pay | Admitting: Orthopedic Surgery

## 2014-07-28 LAB — BASIC METABOLIC PANEL
Anion gap: 3 — ABNORMAL LOW (ref 5–15)
BUN: 12 mg/dL (ref 6–23)
CO2: 27 mmol/L (ref 19–32)
Calcium: 8.3 mg/dL — ABNORMAL LOW (ref 8.4–10.5)
Chloride: 111 mmol/L (ref 96–112)
Creatinine, Ser: 0.49 mg/dL — ABNORMAL LOW (ref 0.50–1.10)
GFR calc Af Amer: >90 mL/min (ref >90)
GFR calc non Af Amer: >90 mL/min (ref >90)
GLUCOSE: 110 mg/dL — AB (ref 70–99)
POTASSIUM: 3.8 mmol/L (ref 3.5–5.1)
Sodium: 141 mmol/L (ref 135–145)

## 2014-07-28 LAB — CBC
HCT: 28 % — ABNORMAL LOW (ref 36.0–46.0)
Hemoglobin: 9.5 g/dL — ABNORMAL LOW (ref 12.0–15.0)
MCH: 30 pg (ref 26.0–34.0)
MCHC: 33.9 g/dL (ref 30.0–36.0)
MCV: 88.3 fL (ref 78.0–100.0)
PLATELETS: 135 10*3/uL — AB (ref 150–400)
RBC: 3.17 MIL/uL — ABNORMAL LOW (ref 3.87–5.11)
RDW: 13.2 % (ref 11.5–15.5)
WBC: 10.2 10*3/uL (ref 4.0–10.5)

## 2014-07-28 NOTE — Progress Notes (Signed)
Physical Therapy Treatment Patient Details Name: Ashley Holland MRN: [id number] DOB: Apr 15, 1941 Today's Date: 07/28/2014    History of Present Illness 74 yo female s/p R THA 07/26/14. Hx of HTN, breast cancer.     PT Comments    Progressing well with mobility. Practiced ambulation, exercises, and stair negotiation. No further questions/concerns from pt. Ready to d/c from PT standpoint.   Follow Up Recommendations  Home health PT     Equipment Recommendations  None recommended by PT    Recommendations for Other Services OT consult     Precautions / Restrictions Precautions Precautions: Fall Restrictions Weight Bearing Restrictions: No RLE Weight Bearing: Weight bearing as tolerated    Mobility  Bed Mobility               General bed mobility comments: pt oob in recliner  Transfers Overall transfer level: Needs assistance Equipment used: Rolling walker (2 wheeled) Transfers: Sit to/from Stand Sit to Stand: Min guard         General transfer comment: cues for hand placement. close guard for safety  Ambulation/Gait Ambulation/Gait assistance: Min guard Ambulation Distance (Feet): 150 Feet Assistive device: Rolling walker (2 wheeled) Gait Pattern/deviations: Step-through pattern;Decreased stride length     General Gait Details: close guard for safety.    Stairs Stairs: Yes Stairs assistance: Min assist Stair Management: Step to pattern;Forwards;One rail Right Number of Stairs: 2 General stair comments: VCs safety, technique, sequence.  Practiced ascending/descending steps with 1 HHA and 1 handrail. Pt performed well.   Wheelchair Mobility    Modified Rankin (Stroke Patients Only)       Balance                                    Cognition Arousal/Alertness: Awake/alert Behavior During Therapy: WFL for tasks assessed/performed Overall Cognitive Status: Within Functional Limits for tasks assessed                       Exercises Total Joint Exercises Long Arc Quad: AROM;Right;10 reps;Seated Knee Flexion: AROM;Right;10 reps;Standing Marching in Standing: AROM;Right;10 reps;Standing General Exercises - Lower Extremity Heel Raises: AROM;Both;10 reps;Standing    General Comments        Pertinent Vitals/Pain Pain Assessment: 0-10 Pain Score: 2  Pain Location: R hip Pain Descriptors / Indicators: Sore Pain Intervention(s): Monitored during session;Ice applied    Home Living                      Prior Function            PT Goals (current goals can now be found in the care plan section) Progress towards PT goals: Progressing toward goals    Frequency  7X/week    PT Plan Current plan remains appropriate    Co-evaluation             End of Session   Activity Tolerance: Patient tolerated treatment well Patient left: in chair;with call bell/phone within reach     Time: 1020-1033 PT Time Calculation (min) (ACUTE ONLY): 13 min  Charges:  $Gait Training: 8-22 mins                    G Codes:      Weston Anna, MPT Pager: [phone]

## 2014-07-29 DIAGNOSIS — M1990 Unspecified osteoarthritis, unspecified site: Secondary | ICD-10-CM | POA: Diagnosis not present

## 2014-07-29 DIAGNOSIS — Z471 Aftercare following joint replacement surgery: Secondary | ICD-10-CM | POA: Diagnosis not present

## 2014-07-29 DIAGNOSIS — Z96641 Presence of right artificial hip joint: Secondary | ICD-10-CM | POA: Diagnosis not present

## 2014-07-29 DIAGNOSIS — C50911 Malignant neoplasm of unspecified site of right female breast: Secondary | ICD-10-CM | POA: Diagnosis not present

## 2014-07-29 DIAGNOSIS — I10 Essential (primary) hypertension: Secondary | ICD-10-CM | POA: Diagnosis not present

## 2014-07-29 DIAGNOSIS — Z9181 History of falling: Secondary | ICD-10-CM | POA: Diagnosis not present

## 2014-07-29 NOTE — Progress Notes (Signed)
   LATE ENTRY NOTE Date of Service of Visit - 07/28/2014 Date of Discharge  Subjective: 2 Days Post-Op Procedure(s) (LRB): RIGHT TOTAL HIP ARTHROPLASTY ANTERIOR APPROACH (Right) Patient reports pain as mild.   Patient seen in rounds for Dr. Wynelle Link. Patient is well, and has had no acute complaints or problems Patient is ready to go home  Objective: Vital signs in last 24 hours: Temp 98.0 Pulse 64 BP 126/65 O2% 98%  Intake/Output from previous day:  Intake/Output Summary (Last 24 hours) at 07/29/14 0724 Last data filed at 07/28/14 0837  Gross per 24 hour  Intake      0 ml  Output    200 ml  Net   -200 ml    Intake/Output this shift: Total I/O In: 1015 [P.O.:720; I.V.:295] Out: 1875 [Urine:1875]  Labs:  Recent Labs  07/27/14 0450 07/28/14 0440  HGB 11.0* 9.5*    Recent Labs  07/27/14 0450 07/28/14 0440  WBC 8.8 10.2  RBC 3.57* 3.17*  HCT 31.4* 28.0*  PLT 159 135*    Recent Labs  07/27/14 0450 07/28/14 0440  NA 136 141  K 4.0 3.8  CL 106 111  CO2 24 27  BUN 16 12  CREATININE 0.68 0.49*  GLUCOSE 141* 110*  CALCIUM 8.2* 8.3*   No results for input(s): LABPT, INR in the last 72 hours.  EXAM: General - Patient is Alert, Appropriate and Oriented Extremity - Neurovascular intact Sensation intact distally Dorsiflexion/Plantar flexion intact Incision - clean, dry, no drainage, healing Motor Function - intact, moving foot and toes well on exam.   Assessment/Plan: 2 Days Post-Op Procedure(s) (LRB): RIGHT TOTAL HIP ARTHROPLASTY ANTERIOR APPROACH (Right) Procedure(s) (LRB): RIGHT TOTAL HIP ARTHROPLASTY ANTERIOR APPROACH (Right) Past Medical History  Diagnosis Date  . Unspecified essential hypertension   . Precordial pain     " no recent problems"  . Other and unspecified hyperlipidemia   . Rectal prolapse     stool softeners helpful.  . Female bladder prolapse   . Enlarged thyroid     "was told had nodules" - no swallowing problems  . GERD  (gastroesophageal reflux disease)     " occ. episodes of regurgitation with vomiting"- has improved with Omeprazole use  . Arthritis     Per medical history form dated 11/04/09.past history DJD back  . Malignant neoplasm of breast (female), unspecified site     right breast cancer. surgery, radiation, oral meds- Arimidex.   Principal Problem:   OA (osteoarthritis) of hip  Estimated body mass index is 25.91 kg/(m^2) as calculated from the following:   Height as of this encounter: 5\' 4"  (1.626 m).   Weight as of this encounter: 68.493 kg (151 lb). Up with therapy Discharge home with home health Diet - Cardiac diet Follow up - in 2 weeks Activity - WBAT Disposition - Home Condition Upon Discharge - Good D/C Meds - See DC Summary DVT Prophylaxis - Xarelto  [name], PA-C Orthopaedic Surgery 07/29/2014, 7:24 AM

## 2014-07-30 DIAGNOSIS — Z471 Aftercare following joint replacement surgery: Secondary | ICD-10-CM | POA: Diagnosis not present

## 2014-07-30 DIAGNOSIS — I10 Essential (primary) hypertension: Secondary | ICD-10-CM | POA: Diagnosis not present

## 2014-07-30 DIAGNOSIS — M1990 Unspecified osteoarthritis, unspecified site: Secondary | ICD-10-CM | POA: Diagnosis not present

## 2014-07-30 DIAGNOSIS — C50911 Malignant neoplasm of unspecified site of right female breast: Secondary | ICD-10-CM | POA: Diagnosis not present

## 2014-07-30 DIAGNOSIS — Z96641 Presence of right artificial hip joint: Secondary | ICD-10-CM | POA: Diagnosis not present

## 2014-07-30 DIAGNOSIS — Z9181 History of falling: Secondary | ICD-10-CM | POA: Diagnosis not present

## 2014-08-02 DIAGNOSIS — Z96641 Presence of right artificial hip joint: Secondary | ICD-10-CM | POA: Diagnosis not present

## 2014-08-02 DIAGNOSIS — M1990 Unspecified osteoarthritis, unspecified site: Secondary | ICD-10-CM | POA: Diagnosis not present

## 2014-08-02 DIAGNOSIS — Z9181 History of falling: Secondary | ICD-10-CM | POA: Diagnosis not present

## 2014-08-02 DIAGNOSIS — C50911 Malignant neoplasm of unspecified site of right female breast: Secondary | ICD-10-CM | POA: Diagnosis not present

## 2014-08-02 DIAGNOSIS — Z471 Aftercare following joint replacement surgery: Secondary | ICD-10-CM | POA: Diagnosis not present

## 2014-08-02 DIAGNOSIS — I10 Essential (primary) hypertension: Secondary | ICD-10-CM | POA: Diagnosis not present

## 2014-08-04 DIAGNOSIS — Z96641 Presence of right artificial hip joint: Secondary | ICD-10-CM | POA: Diagnosis not present

## 2014-08-04 DIAGNOSIS — Z471 Aftercare following joint replacement surgery: Secondary | ICD-10-CM | POA: Diagnosis not present

## 2014-08-04 DIAGNOSIS — C50911 Malignant neoplasm of unspecified site of right female breast: Secondary | ICD-10-CM | POA: Diagnosis not present

## 2014-08-04 DIAGNOSIS — I10 Essential (primary) hypertension: Secondary | ICD-10-CM | POA: Diagnosis not present

## 2014-08-04 DIAGNOSIS — M1990 Unspecified osteoarthritis, unspecified site: Secondary | ICD-10-CM | POA: Diagnosis not present

## 2014-08-04 DIAGNOSIS — Z9181 History of falling: Secondary | ICD-10-CM | POA: Diagnosis not present

## 2014-08-06 DIAGNOSIS — I10 Essential (primary) hypertension: Secondary | ICD-10-CM | POA: Diagnosis not present

## 2014-08-06 DIAGNOSIS — Z96641 Presence of right artificial hip joint: Secondary | ICD-10-CM | POA: Diagnosis not present

## 2014-08-06 DIAGNOSIS — M1990 Unspecified osteoarthritis, unspecified site: Secondary | ICD-10-CM | POA: Diagnosis not present

## 2014-08-06 DIAGNOSIS — Z9181 History of falling: Secondary | ICD-10-CM | POA: Diagnosis not present

## 2014-08-06 DIAGNOSIS — C50911 Malignant neoplasm of unspecified site of right female breast: Secondary | ICD-10-CM | POA: Diagnosis not present

## 2014-08-06 DIAGNOSIS — Z471 Aftercare following joint replacement surgery: Secondary | ICD-10-CM | POA: Diagnosis not present

## 2014-08-10 DIAGNOSIS — Z471 Aftercare following joint replacement surgery: Secondary | ICD-10-CM | POA: Diagnosis not present

## 2014-08-10 DIAGNOSIS — Z96641 Presence of right artificial hip joint: Secondary | ICD-10-CM | POA: Diagnosis not present

## 2014-08-26 ENCOUNTER — Other Ambulatory Visit: Payer: Self-pay

## 2014-08-26 MED ORDER — OLMESARTAN MEDOXOMIL-HCTZ 40-12.5 MG PO TABS: 1.0000 | ORAL_TABLET | Freq: Every day | ORAL | Status: DC

## 2014-08-30 DIAGNOSIS — Z471 Aftercare following joint replacement surgery: Secondary | ICD-10-CM | POA: Diagnosis not present

## 2014-08-30 DIAGNOSIS — Z96641 Presence of right artificial hip joint: Secondary | ICD-10-CM | POA: Diagnosis not present

## 2014-09-14 ENCOUNTER — Other Ambulatory Visit: Payer: Self-pay | Admitting: *Deleted

## 2014-09-14 MED ORDER — OLMESARTAN MEDOXOMIL-HCTZ 40-12.5 MG PO TABS: 1.0000 | ORAL_TABLET | Freq: Every day | ORAL | Status: DC

## 2014-09-15 DIAGNOSIS — H578 Other specified disorders of eye and adnexa: Secondary | ICD-10-CM | POA: Diagnosis not present

## 2014-10-06 DIAGNOSIS — N6002 Solitary cyst of left breast: Secondary | ICD-10-CM | POA: Diagnosis not present

## 2014-10-06 DIAGNOSIS — N63 Unspecified lump in breast: Secondary | ICD-10-CM | POA: Diagnosis not present

## 2014-10-14 DIAGNOSIS — Z96641 Presence of right artificial hip joint: Secondary | ICD-10-CM | POA: Diagnosis not present

## 2014-10-14 DIAGNOSIS — Z471 Aftercare following joint replacement surgery: Secondary | ICD-10-CM | POA: Diagnosis not present

## 2014-11-08 ENCOUNTER — Other Ambulatory Visit: Payer: Self-pay | Admitting: Family Medicine

## 2014-11-09 DIAGNOSIS — Z808 Family history of malignant neoplasm of other organs or systems: Secondary | ICD-10-CM | POA: Diagnosis not present

## 2014-11-09 DIAGNOSIS — E049 Nontoxic goiter, unspecified: Secondary | ICD-10-CM | POA: Diagnosis not present

## 2014-11-09 DIAGNOSIS — E063 Autoimmune thyroiditis: Secondary | ICD-10-CM | POA: Diagnosis not present

## 2014-11-09 DIAGNOSIS — E042 Nontoxic multinodular goiter: Secondary | ICD-10-CM | POA: Diagnosis not present

## 2014-11-09 DIAGNOSIS — E041 Nontoxic single thyroid nodule: Secondary | ICD-10-CM | POA: Diagnosis not present

## 2014-11-09 NOTE — Telephone Encounter (Signed)
Last seen 05/19/14 DR Sabra Heck  Requesting a 90 day supply

## 2014-11-17 ENCOUNTER — Encounter: Payer: Self-pay | Admitting: Family Medicine

## 2014-11-17 ENCOUNTER — Ambulatory Visit: Payer: Medicare Other | Admitting: Family Medicine

## 2014-11-17 ENCOUNTER — Ambulatory Visit (INDEPENDENT_AMBULATORY_CARE_PROVIDER_SITE_OTHER): Payer: Medicare Other | Admitting: Family Medicine

## 2014-11-17 VITALS — BP 149/82 | HR 70 | Temp 97.2°F | Ht 64.0 in | Wt 142.6 lb

## 2014-11-17 DIAGNOSIS — E785 Hyperlipidemia, unspecified: Secondary | ICD-10-CM

## 2014-11-17 DIAGNOSIS — I10 Essential (primary) hypertension: Secondary | ICD-10-CM | POA: Diagnosis not present

## 2014-11-17 NOTE — Progress Notes (Signed)
Subjective:    Patient ID: Ashley Holland, female    DOB: 12-Sep-1940, 74 y.o.   MRN: [id number]  HPI 74 year old female who is here to follow-up blood pressure. She has been monitoring her blood pressure on 40 mg of Benicar and hydrochlorothiazide. There have been somewhat lower numbers curiously in the mornings when it should be higher. She is not really symptomatic when the numbers are lower and below her main in the 90s over 21s. Highest pressures recorded have been less than 1:30. She had hip replacement and is doing well from that.  She also has some symptoms of early satiety and nausea and has cut back on her meals. Her weight has dropped 10 pounds since last visit in March. She has been worked up previously for these symptoms and was told basically that symptoms may be functional.  Patient Active Problem List   Diagnosis Date Noted  . OA (osteoarthritis) of hip 07/26/2014  . Nose ulceration 05/19/2013  . GERD (gastroesophageal reflux disease) 05/19/2013  . Cancer of right breast 12/10/2011  . Arthritis/joint pain 09/22/2010  . Hormone replacement therapy (postmenopausal) 09/22/2010  . Menopause 09/22/2010  . HTN (hypertension) 08/21/2010  . Precordial pain   . Hyperlipidemia    Outpatient Encounter Prescriptions as of 11/17/2014  Medication Sig  . anastrozole (ARIMIDEX) 1 MG tablet Take 1 mg by mouth daily.    [name] BENICAR HCT 40-12.5 MG per tablet Take 1 tablet by mouth  daily  . docusate sodium (COLACE) 100 MG capsule Take 200 mg by mouth at bedtime.   . simvastatin (ZOCOR) 10 MG tablet Take 1 tablet (10 mg total) by mouth at bedtime.  . [DISCONTINUED] methocarbamol (ROBAXIN) 500 MG tablet Take 1 tablet (500 mg total) by mouth every 6 (six) hours as needed for muscle spasms.  . [DISCONTINUED] omeprazole (PRILOSEC) 20 MG capsule Take 20 mg by mouth daily.  . [DISCONTINUED] oxyCODONE (OXY IR/ROXICODONE) 5 MG immediate release tablet Take 1-2 tablets (5-10 mg total) by mouth every 3  (three) hours as needed for moderate pain, severe pain or breakthrough pain.  . [DISCONTINUED] rivaroxaban (XARELTO) 10 MG TABS tablet Take 1 tablet (10 mg total) by mouth daily with breakfast. Take Xarelto for two and a half more weeks, then discontinue Xarelto. Once the patient has completed the Xarelto, they may resume the 81 mg Aspirin.  . [DISCONTINUED] traMADol (ULTRAM) 50 MG tablet Take 1-2 tablets (50-100 mg total) by mouth every 6 (six) hours as needed (mild pain).   No facility-administered encounter medications on file as of 11/17/2014.      Review of Systems  Constitutional: Positive for unexpected weight change.  Respiratory: Negative.   Cardiovascular: Negative.   Gastrointestinal: Positive for nausea.  Neurological: Negative.   Psychiatric/Behavioral: Negative.        Objective:   Physical Exam  Constitutional: She appears well-developed and well-nourished.  Cardiovascular: Normal rate and regular rhythm.   Pulmonary/Chest: Effort normal and breath sounds normal.      BP 149/82 mmHg  Pulse 70  Temp(Src) 97.2 F (36.2 C) (Oral)  Ht 5\' 4"  (1.626 m)  Wt 142 lb 9.6 oz (64.683 kg)  BMI 24.47 kg/m2     Assessment & Plan:  1. Essential hypertension Since her blood pressures have vacillated with some lowers, will reduce Benicar from 40-20 mg and have asked her to follow pressure.  2. Hyperlipidemia Lipids were at goal in January on very low-dose statin. She would like to stop the statin. I  have asked her to discontinue the Zocor and in 2 months will plan on repeating lipids  Wardell Honour MD

## 2014-11-18 ENCOUNTER — Encounter: Payer: Self-pay | Admitting: Family Medicine

## 2014-11-18 ENCOUNTER — Telehealth: Payer: Self-pay | Admitting: Family Medicine

## 2014-11-18 NOTE — Telephone Encounter (Signed)
lmtcb

## 2014-11-23 DIAGNOSIS — C50511 Malignant neoplasm of lower-outer quadrant of right female breast: Secondary | ICD-10-CM | POA: Diagnosis not present

## 2014-11-25 NOTE — Telephone Encounter (Signed)
Pt states that miller was to call her in a "old-timey" drug for heartburn - it is not at pharmacy!  She is aware the Sabra Heck is out of the office this week and we will call her back once this has been addressed.

## 2014-11-27 NOTE — Telephone Encounter (Signed)
Supposed to have called in Librax, 1 tab 30 min ac  #20

## 2014-11-29 MED ORDER — CILIDINIUM-CHLORDIAZEPOXIDE 2.5-5 MG PO CAPS: 1.0000 | ORAL_CAPSULE | Freq: Every day | ORAL | Status: DC

## 2015-01-18 ENCOUNTER — Other Ambulatory Visit (INDEPENDENT_AMBULATORY_CARE_PROVIDER_SITE_OTHER): Payer: Medicare Other

## 2015-01-18 DIAGNOSIS — E785 Hyperlipidemia, unspecified: Secondary | ICD-10-CM

## 2015-01-18 DIAGNOSIS — Z23 Encounter for immunization: Secondary | ICD-10-CM | POA: Diagnosis not present

## 2015-01-18 NOTE — Progress Notes (Signed)
Lab only 

## 2015-01-19 LAB — LIPID PANEL
Chol/HDL Ratio: 3.4 ratio units (ref 0.0–4.4)
Cholesterol, Total: 187 mg/dL (ref 100–199)
HDL: 55 mg/dL (ref >39)
LDL Calculated: 115 mg/dL — ABNORMAL HIGH (ref 0–99)
TRIGLYCERIDES: 83 mg/dL (ref 0–149)
VLDL Cholesterol Cal: 17 mg/dL (ref 5–40)

## 2015-02-09 ENCOUNTER — Telehealth: Payer: Self-pay | Admitting: Family Medicine

## 2015-02-09 ENCOUNTER — Encounter: Payer: Self-pay | Admitting: Family Medicine

## 2015-02-25 ENCOUNTER — Ambulatory Visit: Payer: Medicare Other | Admitting: *Deleted

## 2015-02-25 VITALS — BP 133/76 | HR 72

## 2015-02-25 DIAGNOSIS — I10 Essential (primary) hypertension: Secondary | ICD-10-CM

## 2015-02-25 NOTE — Progress Notes (Signed)
Pt here for BP check

## 2015-03-07 DIAGNOSIS — K635 Polyp of colon: Secondary | ICD-10-CM | POA: Diagnosis not present

## 2015-03-07 DIAGNOSIS — R634 Abnormal weight loss: Secondary | ICD-10-CM | POA: Diagnosis not present

## 2015-03-07 DIAGNOSIS — C50911 Malignant neoplasm of unspecified site of right female breast: Secondary | ICD-10-CM | POA: Diagnosis not present

## 2015-03-07 DIAGNOSIS — R110 Nausea: Secondary | ICD-10-CM | POA: Insufficient documentation

## 2015-03-07 DIAGNOSIS — C50919 Malignant neoplasm of unspecified site of unspecified female breast: Secondary | ICD-10-CM | POA: Diagnosis not present

## 2015-04-28 ENCOUNTER — Encounter: Payer: Self-pay | Admitting: Family Medicine

## 2015-04-28 ENCOUNTER — Ambulatory Visit (INDEPENDENT_AMBULATORY_CARE_PROVIDER_SITE_OTHER): Payer: Medicare Other | Admitting: Family Medicine

## 2015-04-28 VITALS — BP 132/82 | HR 75 | Temp 97.0°F | Ht 64.0 in | Wt 137.2 lb

## 2015-04-28 DIAGNOSIS — I10 Essential (primary) hypertension: Secondary | ICD-10-CM

## 2015-04-28 DIAGNOSIS — E785 Hyperlipidemia, unspecified: Secondary | ICD-10-CM

## 2015-04-28 DIAGNOSIS — M1611 Unilateral primary osteoarthritis, right hip: Secondary | ICD-10-CM | POA: Diagnosis not present

## 2015-04-28 MED ORDER — ALPRAZOLAM 0.25 MG PO TABS: 0.2500 mg | ORAL_TABLET | Freq: Two times a day (BID) | ORAL | Status: DC | PRN

## 2015-04-28 NOTE — Progress Notes (Signed)
   Subjective:    Patient ID: Ashley Holland, female    DOB: 18-Mar-1941, 75 y.o.   MRN: [id number]  HPI 75 year old female who is here to follow-up hypertension, hyperlipidemia, and chronic nausea and weight loss.  Regarding the weight loss and nausea she has been worked up in the past by gastroenterology without obvious etiology. Apparently, it was thought that stress was a contributing factor and she was recommended that she start practicing yoga. The fact that she has lost weight over the past 5 years ago makes me suspicious for other clinical etiology.  She stopped her statin since her last visit. She also stopped Rheumatrex that she was taking for breast cancer. This was done with the blessing of the oncologist. Her symptoms of nausea did not change with the cessation of these meds.  Patient Active Problem List   Diagnosis Date Noted  . OA (osteoarthritis) of hip 07/26/2014  . Nose ulceration 05/19/2013  . GERD (gastroesophageal reflux disease) 05/19/2013  . Cancer of right breast ([place]) 12/10/2011  . Arthritis/joint pain 09/22/2010  . Hormone replacement therapy (postmenopausal) 09/22/2010  . Menopause 09/22/2010  . HTN (hypertension) 08/21/2010  . Precordial pain   . Hyperlipidemia    Outpatient Encounter Prescriptions as of 04/28/2015  Medication Sig  . aspirin EC 81 MG tablet Take 81 mg by mouth daily.  [name] BENICAR HCT 40-12.5 MG per tablet Take 1 tablet by mouth  daily  . docusate sodium (COLACE) 100 MG capsule Take 200 mg by mouth at bedtime.   . [DISCONTINUED] anastrozole (ARIMIDEX) 1 MG tablet Take 1 mg by mouth daily.    . [DISCONTINUED] clidinium-chlordiazePOXIDE (LIBRAX) 5-2.5 MG per capsule Take 1 capsule by mouth daily before breakfast.  . [DISCONTINUED] simvastatin (ZOCOR) 10 MG tablet Take 1 tablet (10 mg total) by mouth at bedtime.   No facility-administered encounter medications on file as of 04/28/2015.      Review of Systems  Constitutional: Positive for appetite  change and unexpected weight change.  Gastrointestinal: Positive for nausea.  Musculoskeletal: Negative.   Psychiatric/Behavioral: Negative.        Objective:   Physical Exam  Constitutional: She is oriented to person, place, and time. She appears well-developed and well-nourished.  HENT:  Head: Normocephalic.  Cardiovascular: Normal rate, regular rhythm and normal heart sounds.   Pulmonary/Chest: Effort normal and breath sounds normal.  Abdominal: Soft.  Neurological: She is alert and oriented to person, place, and time.  Psychiatric: She has a normal mood and affect.          Assessment & Plan:  1. Essential hypertension Blood pressure is well controlled on current regimen of olmesartan and hydrochlorothiazide. Continue same  2. Hyperlipidemia Check lipids at next visit since she has stopped statin. With her weight loss would expect lipids to be okay  3. Primary osteoarthritis of right hip An 8 hip replacement last year and is now able to go on morning walks and do most anything she wants without pain  Wardell Honour MD

## 2015-05-05 ENCOUNTER — Telehealth: Payer: Self-pay | Admitting: Family Medicine

## 2015-05-05 MED ORDER — ALPRAZOLAM 0.25 MG PO TABS: 0.2500 mg | ORAL_TABLET | Freq: Two times a day (BID) | ORAL | Status: DC | PRN

## 2015-05-05 NOTE — Telephone Encounter (Signed)
Called in and pt aware by VM

## 2015-05-05 NOTE — Telephone Encounter (Signed)
Sending to you because you are the only one to see her other than Sabra Heck

## 2015-05-05 NOTE — Telephone Encounter (Signed)
Please call in xanax 0.25mg  1 po BID #20 with 0 refills

## 2015-05-18 ENCOUNTER — Telehealth: Payer: Self-pay | Admitting: Family Medicine

## 2015-05-18 NOTE — Telephone Encounter (Signed)
Patient states that the alprazolam is not helping and wants to know what you recommend next

## 2015-05-26 NOTE — Telephone Encounter (Signed)
I think at the time of visit we discussed use of Librax one tablet 30 minutes before meals. We could give her 35 which would be enough for 1 week as a trial prescription

## 2015-05-27 ENCOUNTER — Telehealth: Payer: Self-pay | Admitting: *Deleted

## 2015-05-27 NOTE — Telephone Encounter (Signed)
Patient called pharmacy and they state she tried librax in August and it did not help. Please advise

## 2015-05-27 NOTE — Telephone Encounter (Signed)
Which she be willing to hold the Remicade X for 1 week or so to see if symptoms improve. I am not saying to discontinue adjust to hold it to see if that may be causing some of her symptoms

## 2015-05-27 NOTE — Telephone Encounter (Signed)
Patient called pharmacy and she states that you had gave her librax back in August and it did not help. Please advise

## 2015-05-27 NOTE — Telephone Encounter (Signed)
Patient is willing to try please order since their are two different strengths.

## 2015-05-30 NOTE — Telephone Encounter (Signed)
Patient is not on Remicade please advise?

## 2015-05-31 NOTE — Telephone Encounter (Signed)
Patient is not on Remicade but rather Arimidex; that's what I would like her to hold for 1 or 2 weeks to see if her symptoms improve

## 2015-05-31 NOTE — Telephone Encounter (Signed)
Patient has already stopped arimidex per patient and your last office note.

## 2015-06-15 DIAGNOSIS — Z1211 Encounter for screening for malignant neoplasm of colon: Secondary | ICD-10-CM | POA: Diagnosis not present

## 2015-06-16 DIAGNOSIS — Z1211 Encounter for screening for malignant neoplasm of colon: Secondary | ICD-10-CM | POA: Diagnosis not present

## 2015-06-21 ENCOUNTER — Telehealth: Payer: Self-pay | Admitting: Family Medicine

## 2015-06-21 NOTE — Telephone Encounter (Signed)
Patient has appointment on 3-2. That would be a good time to discuss options.

## 2015-06-23 ENCOUNTER — Ambulatory Visit (INDEPENDENT_AMBULATORY_CARE_PROVIDER_SITE_OTHER): Payer: Medicare Other | Admitting: Family Medicine

## 2015-06-23 ENCOUNTER — Encounter: Payer: Self-pay | Admitting: Family Medicine

## 2015-06-23 VITALS — BP 124/78 | HR 73 | Temp 96.7°F | Ht 64.0 in | Wt 132.0 lb

## 2015-06-23 DIAGNOSIS — R110 Nausea: Secondary | ICD-10-CM

## 2015-06-23 DIAGNOSIS — R630 Anorexia: Secondary | ICD-10-CM

## 2015-06-23 DIAGNOSIS — R1013 Epigastric pain: Secondary | ICD-10-CM | POA: Diagnosis not present

## 2015-06-23 DIAGNOSIS — R634 Abnormal weight loss: Secondary | ICD-10-CM | POA: Diagnosis not present

## 2015-06-23 NOTE — Telephone Encounter (Signed)
Pt has appt this morning 

## 2015-06-23 NOTE — Progress Notes (Signed)
   Subjective:    Patient ID: Ashley Holland, female    DOB: 10/21/1940, 75 y.o.   MRN: [id number]  HPI Patient here today for follow up on stomach issues. Patient has ongoing abdominal discomfort with nausea but no vomiting. She gets hungry but eats very little and feels full. Weight loss has continued. Looking back through her chart, weight was 160 and 2015 and today she is down to 132. This weight loss makes me wonder if it is not more than a functional problem which it has been diagnosed in the past. She saw gastroenterology back in 2012 and her symptoms which are similar result to be functional. She is status post hysterectomy but ovaries are in place. We have tried numerous medications including PPIs, Bentyl, Librax, and Xanax. Also suggested to discontinue her aromatase inhibitor. 9 of these medicines have helped her symptoms. She does have some constipation and takes stool softeners for that.     Patient Active Problem List   Diagnosis Date Noted  . OA (osteoarthritis) of hip 07/26/2014  . Nose ulceration 05/19/2013  . GERD (gastroesophageal reflux disease) 05/19/2013  . Cancer of right breast ([place]) 12/10/2011  . Arthritis/joint pain 09/22/2010  . Hormone replacement therapy (postmenopausal) 09/22/2010  . Menopause 09/22/2010  . HTN (hypertension) 08/21/2010  . Precordial pain   . Hyperlipidemia    Outpatient Encounter Prescriptions as of 06/23/2015  Medication Sig  . aspirin EC 81 MG tablet Take 81 mg by mouth daily.  [name] BENICAR HCT 40-12.5 MG per tablet Take 1 tablet by mouth  daily  . docusate sodium (COLACE) 100 MG capsule Take 200 mg by mouth at bedtime.   . [DISCONTINUED] ALPRAZolam (XANAX) 0.25 MG tablet Take 1 tablet (0.25 mg total) by mouth 2 (two) times daily as needed for anxiety.   No facility-administered encounter medications on file as of 06/23/2015.      Review of Systems  Constitutional: Positive for appetite change (decreased).  HENT: Negative.   Eyes:  Negative.   Respiratory: Negative.   Cardiovascular: Negative.   Gastrointestinal: Positive for nausea and abdominal pain.  Endocrine: Negative.   Genitourinary: Negative.   Musculoskeletal: Negative.   Skin: Negative.   Allergic/Immunologic: Negative.   Neurological: Negative.   Hematological: Negative.   Psychiatric/Behavioral: Negative.        Objective:   Physical Exam  Constitutional: She appears well-developed and well-nourished.  Abdominal: Soft. Bowel sounds are normal. She exhibits no mass. There is no tenderness. There is no guarding.   BP 124/78 mmHg  Pulse 73  Temp(Src) 96.7 F (35.9 C) (Oral)  Ht 5\' 4"  (1.626 m)  Wt 132 lb (59.875 kg)  BMI 22.65 kg/m2        Assessment & Plan:  1. Nausea without vomiting With weight loss, it is hard to classify this as functional. Would like to get another opinion from gastroenterology. If diagnosis is still the same would consider a trial of Viberzi - Ambulatory referral to Gastroenterology  2. Loss of appetite Above for discussion - Ambulatory referral to Gastroenterology  3. Weight loss Has lost almost 30 pounds in the last 1-1/2 years - Ambulatory referral to Gastroenterology  4. Epigastric pain Above for discussion - Ambulatory referral to Gastroenterology  Wardell Honour MD

## 2015-06-24 DIAGNOSIS — Z96641 Presence of right artificial hip joint: Secondary | ICD-10-CM | POA: Diagnosis not present

## 2015-06-24 DIAGNOSIS — Z471 Aftercare following joint replacement surgery: Secondary | ICD-10-CM | POA: Diagnosis not present

## 2015-07-01 ENCOUNTER — Telehealth: Payer: Self-pay | Admitting: Family Medicine

## 2015-07-01 NOTE — Telephone Encounter (Signed)
Pt aware of referral status

## 2015-07-05 ENCOUNTER — Telehealth: Payer: Self-pay | Admitting: Family Medicine

## 2015-07-06 NOTE — Telephone Encounter (Signed)
Will check on this.

## 2015-07-08 ENCOUNTER — Telehealth: Payer: Self-pay | Admitting: Family Medicine

## 2015-07-08 NOTE — Telephone Encounter (Signed)
Pt will wait on Dr Earlean Shawl  - aware of Debbie's notes

## 2015-07-08 NOTE — Telephone Encounter (Signed)
Dr Sabra Heck put this in for Dr Earlean Shawl   She has never seen him before and he has been away and the office said he will review chart next week  I probably can get her in somewhere else quicker

## 2015-07-11 ENCOUNTER — Other Ambulatory Visit: Payer: Self-pay | Admitting: Family Medicine

## 2015-08-04 ENCOUNTER — Ambulatory Visit: Payer: Medicare Other | Admitting: Family Medicine

## 2015-08-19 DIAGNOSIS — R634 Abnormal weight loss: Secondary | ICD-10-CM | POA: Diagnosis not present

## 2015-09-16 IMAGING — CR DG PORTABLE PELVIS
1 series · 1 of 1 positions shown · non-contrast
Comparison: None.

CLINICAL DATA: Total right hip arthroplasty

EXAM:
DG C-ARM 1-60 MIN - NRPT [facility]; PORTABLE PELVIS 1-2 VIEWS

[AP]
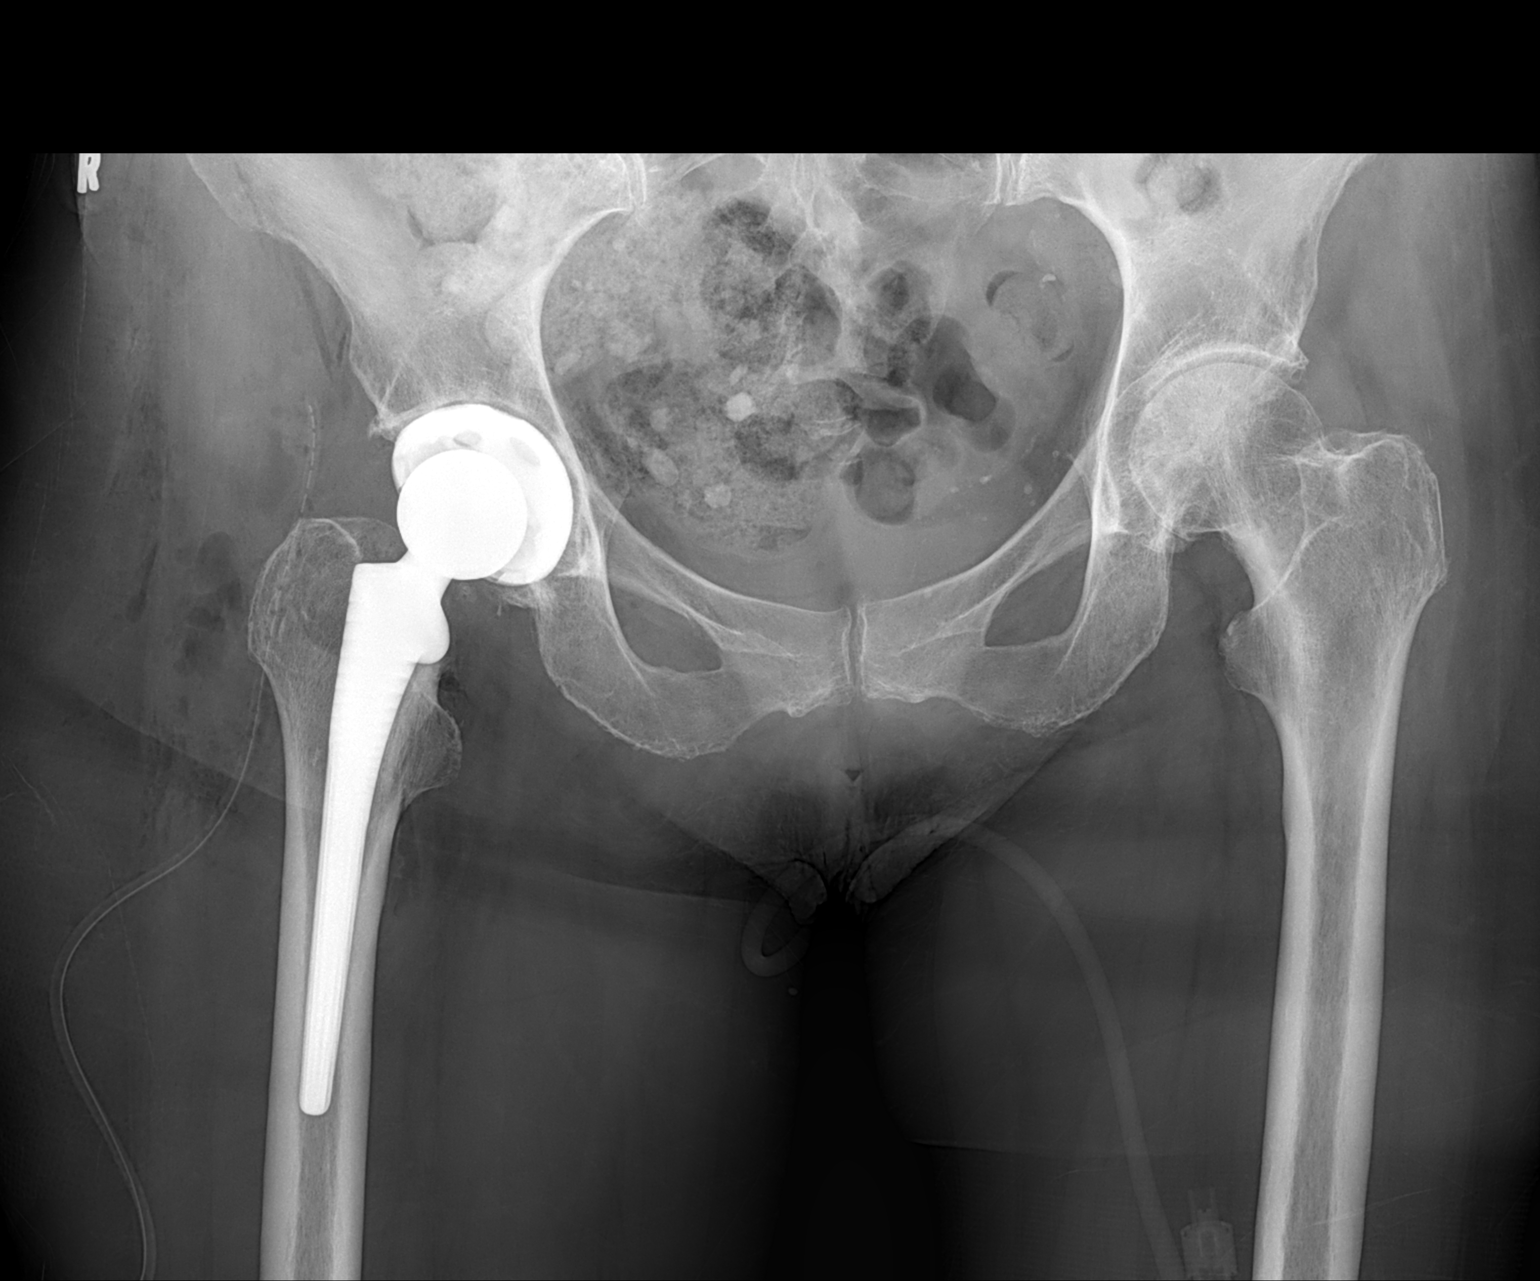

[1 of 1 positions shown; findings below may reference images not displayed]

FINDINGS: The femoral and acetabular components are well seated. No
complicating features are demonstrated. The visualized bony pelvis
is intact.
IMPRESSION: Well seated components of a total right hip arthroplasty.

## 2015-09-20 ENCOUNTER — Other Ambulatory Visit: Payer: Self-pay | Admitting: Gastroenterology

## 2015-09-20 DIAGNOSIS — R634 Abnormal weight loss: Secondary | ICD-10-CM

## 2015-09-21 DIAGNOSIS — R634 Abnormal weight loss: Secondary | ICD-10-CM | POA: Diagnosis not present

## 2015-09-27 ENCOUNTER — Ambulatory Visit
Admission: RE | Admit: 2015-09-27 | Discharge: 2015-09-27 | Disposition: A | Discharge status: Home / Self Care | Payer: Medicare Other | Source: Ambulatory Visit | Attending: Gastroenterology | Admitting: Gastroenterology

## 2015-09-27 DIAGNOSIS — R634 Abnormal weight loss: Secondary | ICD-10-CM

## 2015-09-27 DIAGNOSIS — R935 Abnormal findings on diagnostic imaging of other abdominal regions, including retroperitoneum: Secondary | ICD-10-CM | POA: Diagnosis not present

## 2015-09-27 DIAGNOSIS — K838 Other specified diseases of biliary tract: Secondary | ICD-10-CM | POA: Diagnosis not present

## 2015-09-27 MED ORDER — GADOBENATE DIMEGLUMINE 529 MG/ML IV SOLN
12.0000 mL | Freq: Once | INTRAVENOUS | Status: AC | PRN
  Administered 2015-09-27: 12 mL via INTRAVENOUS

## 2015-10-03 DIAGNOSIS — R634 Abnormal weight loss: Secondary | ICD-10-CM | POA: Diagnosis not present

## 2015-10-11 DIAGNOSIS — Z853 Personal history of malignant neoplasm of breast: Secondary | ICD-10-CM | POA: Diagnosis not present

## 2015-10-11 DIAGNOSIS — N6002 Solitary cyst of left breast: Secondary | ICD-10-CM | POA: Diagnosis not present

## 2015-10-26 ENCOUNTER — Encounter: Payer: Self-pay | Admitting: Oncology

## 2015-11-10 DIAGNOSIS — E042 Nontoxic multinodular goiter: Secondary | ICD-10-CM | POA: Diagnosis not present

## 2015-11-10 HISTORY — DX: Nontoxic multinodular goiter: E04.2

## 2015-11-25 ENCOUNTER — Ambulatory Visit (INDEPENDENT_AMBULATORY_CARE_PROVIDER_SITE_OTHER): Payer: Medicare Other | Admitting: Family Medicine

## 2015-11-25 ENCOUNTER — Encounter: Payer: Self-pay | Admitting: Family Medicine

## 2015-11-25 VITALS — BP 178/88 | HR 60 | Temp 97.0°F | Ht 64.0 in | Wt 137.0 lb

## 2015-11-25 DIAGNOSIS — I10 Essential (primary) hypertension: Secondary | ICD-10-CM | POA: Diagnosis not present

## 2015-11-25 DIAGNOSIS — E785 Hyperlipidemia, unspecified: Secondary | ICD-10-CM | POA: Diagnosis not present

## 2015-11-25 LAB — CMP14+EGFR
ALBUMIN: 4.1 g/dL (ref 3.5–4.8)
ALT: 13 IU/L (ref 0–32)
AST: 28 IU/L (ref 0–40)
Albumin/Globulin Ratio: 1.7 (ref 1.2–2.2)
Alkaline Phosphatase: 98 IU/L (ref 39–117)
BUN/Creatinine Ratio: 22 (ref 12–28)
BUN: 17 mg/dL (ref 8–27)
Bilirubin Total: 0.4 mg/dL (ref 0.0–1.2)
CALCIUM: 9.2 mg/dL (ref 8.7–10.3)
CO2: 25 mmol/L (ref 18–29)
CREATININE: 0.78 mg/dL (ref 0.57–1.00)
Chloride: 104 mmol/L (ref 96–106)
GFR, EST AFRICAN AMERICAN: 85 mL/min/{1.73_m2} (ref >59)
GFR, EST NON AFRICAN AMERICAN: 74 mL/min/{1.73_m2} (ref >59)
GLOBULIN, TOTAL: 2.4 g/dL (ref 1.5–4.5)
Glucose: 80 mg/dL (ref 65–99)
Potassium: 4.4 mmol/L (ref 3.5–5.2)
SODIUM: 144 mmol/L (ref 134–144)
Total Protein: 6.5 g/dL (ref 6.0–8.5)

## 2015-11-25 LAB — LIPID PANEL
CHOL/HDL RATIO: 3.1 ratio (ref 0.0–4.4)
Cholesterol, Total: 206 mg/dL — ABNORMAL HIGH (ref 100–199)
HDL: 67 mg/dL (ref >39)
LDL CALC: 121 mg/dL — AB (ref 0–99)
Triglycerides: 90 mg/dL (ref 0–149)
VLDL Cholesterol Cal: 18 mg/dL (ref 5–40)

## 2015-11-25 MED ORDER — HYDROCHLOROTHIAZIDE 12.5 MG PO CAPS: 12.5000 mg | ORAL_CAPSULE | Freq: Every day | ORAL | 3 refills | Status: DC

## 2015-11-25 NOTE — Progress Notes (Signed)
   Subjective:    Patient ID: Ashley Holland, female    DOB: 1941/04/07, 75 y.o.   MRN: [id number]  HPI Pt here for follow up and management of chronic medical problems which includes hyperlipidemia and hypertension. She is taking medications regularly. Patient had had weight loss and anorexia and the [facility] GI consultant finally stumbled on the reason that happened to be olmesartan. Once that was discontinued her taste came back and she still has gained about 10 pounds back and feels much better. She is off all blood pressure medicines and has been monitoring. For the most part blood pressures have been okay in the evening somewhat elevated in the morning, not unexpected. We discussed options and mutually agreed to restart low-dose diuretic for better control of blood pressure    Patient Active Problem List   Diagnosis Date Noted  . OA (osteoarthritis) of hip 07/26/2014  . Nose ulceration 05/19/2013  . GERD (gastroesophageal reflux disease) 05/19/2013  . Cancer of right breast ([place]) 12/10/2011  . Arthritis/joint pain 09/22/2010  . Hormone replacement therapy (postmenopausal) 09/22/2010  . Menopause 09/22/2010  . HTN (hypertension) 08/21/2010  . Precordial pain   . Hyperlipidemia    Outpatient Encounter Prescriptions as of 11/25/2015  Medication Sig  . aspirin EC 81 MG tablet Take 81 mg by mouth daily.  . calcium carbonate (CALCIUM 600) 600 MG TABS tablet Take 600 mg by mouth 2 (two) times daily with a meal.  . docusate sodium (COLACE) 100 MG capsule Take 200 mg by mouth at bedtime.   . Lactobacillus (ACIDOPHILUS) CAPS capsule Take by mouth.  . Multiple Vitamin (MULTIVITAMIN WITH MINERALS) TABS tablet Take 1 tablet by mouth daily.  . [DISCONTINUED] BENICAR HCT 40-12.5 MG tablet Take 1 tablet by mouth  daily   No facility-administered encounter medications on file as of 11/25/2015.       Review of Systems  Constitutional: Negative.   HENT: Negative.   Eyes: Negative.   Respiratory:  Negative.   Cardiovascular: Negative.   Gastrointestinal: Negative.   Endocrine: Negative.   Genitourinary: Negative.   Musculoskeletal: Negative.   Skin: Negative.   Allergic/Immunologic: Negative.   Neurological: Negative.   Hematological: Negative.   Psychiatric/Behavioral: Negative.        Objective:   Physical Exam  Constitutional: She is oriented to person, place, and time. She appears well-developed and well-nourished.  Cardiovascular: Normal rate and regular rhythm.   Pulmonary/Chest: Effort normal and breath sounds normal.  Abdominal: Soft.  Neurological: She is alert and oriented to person, place, and time.  Psychiatric: She has a normal mood and affect. Her behavior is normal.   BP (!) 157/90 (BP Location: Right Arm)   Pulse 62   Temp 97 F (36.1 C) (Oral)   Ht '5\' 4"'$  (1.626 m)   Wt 137 lb (62.1 kg)   BMI 23.52 kg/m         Assessment & Plan:  1. Essential hypertension Again hydrochlorothiazide 12.5 mg. Blood pressure does not come down to goal of 135/85 or below consider addition of amlodipine - CMP14+EGFR  2. Hyperlipidemia We will check lipids today that when asked if she would be interesting and starting lipid lowering agent she says not  Wardell Honour MD - Lipid panel

## 2015-11-25 NOTE — Patient Instructions (Signed)
Medicare Annual Wellness Visit  [place] and the medical providers at [facility] strive to bring you the best medical care.  In doing so we not only want to address your current medical conditions and concerns but also to detect new conditions early and prevent illness, disease and health-related problems.    Medicare offers a yearly Wellness Visit which allows our clinical staff to assess your need for preventative services including immunizations, lifestyle education, counseling to decrease risk of preventable diseases and screening for fall risk and other medical concerns.    This visit is provided free of charge (no copay) for all Medicare recipients. The clinical pharmacists at [facility] have begun to conduct these Wellness Visits which will also include a thorough review of all your medications.    As you primary medical provider recommend that you make an appointment for your Annual Wellness Visit if you have not done so already this year.  You may set up this appointment before you leave today or you may call back ([phone]) and schedule an appointment.  Please make sure when you call that you mention that you are scheduling your Annual Wellness Visit with the clinical pharmacist so that the appointment may be made for the proper length of time.     Continue current medications. Continue good therapeutic lifestyle changes which include good diet and exercise. Fall precautions discussed with patient. If an FOBT was given today- please return it to our front desk. If you are over 50 years old - you may need Prevnar 13 or the adult Pneumonia vaccine.  After your visit with us today you will receive a survey in the mail or online from [employer] regarding your care with us. Please take a moment to fill this out. Your feedback is very important to us as you can help us better understand your patient needs as well as improve  your experience and satisfaction. WE CARE ABOUT YOU!!!    

## 2016-02-29 ENCOUNTER — Ambulatory Visit (INDEPENDENT_AMBULATORY_CARE_PROVIDER_SITE_OTHER): Payer: Medicare Other

## 2016-02-29 DIAGNOSIS — Z23 Encounter for immunization: Secondary | ICD-10-CM | POA: Diagnosis not present

## 2016-03-13 DIAGNOSIS — Z853 Personal history of malignant neoplasm of breast: Secondary | ICD-10-CM | POA: Diagnosis not present

## 2016-03-13 DIAGNOSIS — Z9221 Personal history of antineoplastic chemotherapy: Secondary | ICD-10-CM | POA: Diagnosis not present

## 2016-03-13 DIAGNOSIS — Z08 Encounter for follow-up examination after completed treatment for malignant neoplasm: Secondary | ICD-10-CM | POA: Diagnosis not present

## 2016-03-13 DIAGNOSIS — Z9011 Acquired absence of right breast and nipple: Secondary | ICD-10-CM | POA: Diagnosis not present

## 2016-03-13 DIAGNOSIS — C50911 Malignant neoplasm of unspecified site of right female breast: Secondary | ICD-10-CM | POA: Diagnosis not present

## 2016-03-13 DIAGNOSIS — Z923 Personal history of irradiation: Secondary | ICD-10-CM | POA: Diagnosis not present

## 2016-03-13 DIAGNOSIS — R030 Elevated blood-pressure reading, without diagnosis of hypertension: Secondary | ICD-10-CM | POA: Diagnosis not present

## 2016-03-13 DIAGNOSIS — Z170 Estrogen receptor positive status [ER+]: Secondary | ICD-10-CM | POA: Diagnosis not present

## 2016-03-29 ENCOUNTER — Other Ambulatory Visit: Payer: Self-pay | Admitting: Family Medicine

## 2016-05-14 ENCOUNTER — Other Ambulatory Visit: Payer: Self-pay | Admitting: Family Medicine

## 2016-05-15 MED ORDER — HYDROCHLOROTHIAZIDE 12.5 MG PO CAPS: 12.5000 mg | ORAL_CAPSULE | Freq: Every day | ORAL | 0 refills | Status: DC

## 2016-05-15 NOTE — Telephone Encounter (Signed)
done

## 2016-05-29 ENCOUNTER — Ambulatory Visit (INDEPENDENT_AMBULATORY_CARE_PROVIDER_SITE_OTHER): Payer: Medicare Other

## 2016-05-29 ENCOUNTER — Encounter: Payer: Self-pay | Admitting: Family Medicine

## 2016-05-29 ENCOUNTER — Ambulatory Visit (INDEPENDENT_AMBULATORY_CARE_PROVIDER_SITE_OTHER): Payer: Medicare Other | Admitting: Family Medicine

## 2016-05-29 VITALS — BP 147/85 | HR 68 | Temp 96.6°F | Ht 64.0 in | Wt 154.0 lb

## 2016-05-29 DIAGNOSIS — Z13820 Encounter for screening for osteoporosis: Secondary | ICD-10-CM | POA: Diagnosis not present

## 2016-05-29 DIAGNOSIS — Z780 Asymptomatic menopausal state: Secondary | ICD-10-CM | POA: Diagnosis not present

## 2016-05-29 DIAGNOSIS — I10 Essential (primary) hypertension: Secondary | ICD-10-CM

## 2016-05-29 DIAGNOSIS — E7800 Pure hypercholesterolemia, unspecified: Secondary | ICD-10-CM | POA: Diagnosis not present

## 2016-05-29 LAB — CMP14+EGFR
A/G RATIO: 1.8 (ref 1.2–2.2)
ALK PHOS: 95 IU/L (ref 39–117)
ALT: 15 IU/L (ref 0–32)
AST: 32 IU/L (ref 0–40)
Albumin: 4.4 g/dL (ref 3.5–4.8)
BILIRUBIN TOTAL: 0.5 mg/dL (ref 0.0–1.2)
BUN/Creatinine Ratio: 31 — ABNORMAL HIGH (ref 12–28)
BUN: 23 mg/dL (ref 8–27)
CHLORIDE: 100 mmol/L (ref 96–106)
CO2: 26 mmol/L (ref 18–29)
Calcium: 9.3 mg/dL (ref 8.7–10.3)
Creatinine, Ser: 0.75 mg/dL (ref 0.57–1.00)
GFR calc non Af Amer: 78 mL/min/{1.73_m2} (ref >59)
GFR, EST AFRICAN AMERICAN: 90 mL/min/{1.73_m2} (ref >59)
Globulin, Total: 2.4 g/dL (ref 1.5–4.5)
Glucose: 84 mg/dL (ref 65–99)
Potassium: 4 mmol/L (ref 3.5–5.2)
Sodium: 140 mmol/L (ref 134–144)
TOTAL PROTEIN: 6.8 g/dL (ref 6.0–8.5)

## 2016-05-29 LAB — LIPID PANEL
Chol/HDL Ratio: 3.1 ratio units (ref 0.0–4.4)
Cholesterol, Total: 221 mg/dL — ABNORMAL HIGH (ref 100–199)
HDL: 72 mg/dL (ref >39)
LDL Calculated: 135 mg/dL — ABNORMAL HIGH (ref 0–99)
Triglycerides: 69 mg/dL (ref 0–149)
VLDL CHOLESTEROL CAL: 14 mg/dL (ref 5–40)

## 2016-05-29 MED ORDER — AMLODIPINE BESYLATE 2.5 MG PO TABS
2.5000 mg | ORAL_TABLET | Freq: Every day | ORAL | 3 refills | Status: DC
Start: 2016-05-29 — End: 2017-05-13

## 2016-05-29 NOTE — Progress Notes (Signed)
   Subjective:    Patient ID: Ashley Holland, female    DOB: 05/03/1940, 75 y.o.   MRN: [id number]  HPI Pt here for follow up and management of chronic medical problems which includes hypertension. She is taking medication regularly. Patient monitors her blood pressure and brings in a record of readings she uses both an arm and wrist cuff pressures have been borderline high. She takes hydrochlorothiazide for blood pressure. She has gained weight but a little too much according to her BMI. Weight loss was finally attributed to Benicar. She took the aromatase inhibitor for 5 years and it was discontinued rather than exercising the option for 5 more years. She is followed by oncology area   Patient Active Problem List   Diagnosis Date Noted  . OA (osteoarthritis) of hip 07/26/2014  . Nose ulceration 05/19/2013  . GERD (gastroesophageal reflux disease) 05/19/2013  . Cancer of right breast ([facility]) 12/10/2011  . Arthritis/joint pain 09/22/2010  . Hormone replacement therapy (postmenopausal) 09/22/2010  . Menopause 09/22/2010  . HTN (hypertension) 08/21/2010  . Precordial pain   . Hyperlipidemia    Outpatient Encounter Prescriptions as of 05/29/2016  Medication Sig  . aspirin EC 81 MG tablet Take 81 mg by mouth daily.  . calcium carbonate (CALCIUM 600) 600 MG TABS tablet Take 600 mg by mouth 2 (two) times daily with a meal.  . docusate sodium (COLACE) 100 MG capsule Take 100 mg by mouth at bedtime.   . hydrochlorothiazide (MICROZIDE) 12.5 MG capsule Take 1 capsule (12.5 mg total) by mouth daily.  . Lactobacillus (ACIDOPHILUS) CAPS capsule Take by mouth.  . Multiple Vitamin (MULTIVITAMIN WITH MINERALS) TABS tablet Take 1 tablet by mouth daily.  . [DISCONTINUED] hydrochlorothiazide (MICROZIDE) 12.5 MG capsule Take 1 capsule (12.5 mg total) by mouth daily.   No facility-administered encounter medications on file as of 05/29/2016.       Review of Systems  Constitutional: Negative.   HENT:  Negative.   Eyes: Negative.   Respiratory: Negative.   Cardiovascular: Negative.   Gastrointestinal: Negative.   Endocrine: Negative.   Genitourinary: Negative.   Musculoskeletal: Negative.   Skin: Negative.   Allergic/Immunologic: Negative.   Neurological: Negative.   Hematological: Negative.   Psychiatric/Behavioral: Negative.        Objective:   Physical Exam  Constitutional: She appears well-developed and well-nourished.  Cardiovascular: Normal rate, regular rhythm and normal heart sounds.   Pulmonary/Chest: Effort normal and breath sounds normal.   BP (!) 173/87 (BP Location: Left Arm)   Pulse 68   Temp (!) 96.6 F (35.9 C) (Oral)   Ht 5' 4" (1.626 m)   Wt 154 lb (69.9 kg)   BMI 26.43 kg/m         Assessment & Plan:  1. Essential hypertension Continue hydrochlorothiazide but add amlodipine and low dose, 2.5 mg - CMP14+EGFR  2. Pure hypercholesterolemia Not taking medication at this time - Lipid panel  3. Screening for osteoporosis Has been several years since her last DEXA scan - DG [facility] DEXA; Future    4. Postmenopausal No history of osteoporosis but does take calcium and vitamin D - DG [facility] DEXA; Future  Stephen M Miller MD 

## 2016-05-29 NOTE — Patient Instructions (Signed)
Medicare Annual Wellness Visit  [place] and the medical providers at [facility] strive to bring you the best medical care.  In doing so we not only want to address your current medical conditions and concerns but also to detect new conditions early and prevent illness, disease and health-related problems.    Medicare offers a yearly Wellness Visit which allows our clinical staff to assess your need for preventative services including immunizations, lifestyle education, counseling to decrease risk of preventable diseases and screening for fall risk and other medical concerns.    This visit is provided free of charge (no copay) for all Medicare recipients. The clinical pharmacists at [facility] have begun to conduct these Wellness Visits which will also include a thorough review of all your medications.    As you primary medical provider recommend that you make an appointment for your Annual Wellness Visit if you have not done so already this year.  You may set up this appointment before you leave today or you may call back ([phone]) and schedule an appointment.  Please make sure when you call that you mention that you are scheduling your Annual Wellness Visit with the clinical pharmacist so that the appointment may be made for the proper length of time.     Continue current medications. Continue good therapeutic lifestyle changes which include good diet and exercise. Fall precautions discussed with patient. If an FOBT was given today- please return it to our front desk. If you are over 50 years old - you may need Prevnar 13 or the adult Pneumonia vaccine.  **Flu shots are available--- please call and schedule a FLU-CLINIC appointment**  After your visit with us today you will receive a survey in the mail or online from [employer] regarding your care with us. Please take a moment to fill this out. Your feedback is very  important to us as you can help us better understand your patient needs as well as improve your experience and satisfaction. WE CARE ABOUT YOU!!!    

## 2016-05-30 NOTE — Progress Notes (Signed)
Patient aware.

## 2016-08-11 ENCOUNTER — Other Ambulatory Visit: Payer: Self-pay | Admitting: Family Medicine

## 2016-10-12 DIAGNOSIS — Z1231 Encounter for screening mammogram for malignant neoplasm of breast: Secondary | ICD-10-CM | POA: Diagnosis not present

## 2016-10-12 DIAGNOSIS — Z853 Personal history of malignant neoplasm of breast: Secondary | ICD-10-CM | POA: Diagnosis not present

## 2016-11-08 ENCOUNTER — Other Ambulatory Visit: Payer: Self-pay | Admitting: Family Medicine

## 2016-11-09 DIAGNOSIS — E042 Nontoxic multinodular goiter: Secondary | ICD-10-CM | POA: Diagnosis not present

## 2016-11-26 ENCOUNTER — Encounter: Payer: Self-pay | Admitting: Family Medicine

## 2016-11-26 ENCOUNTER — Ambulatory Visit (INDEPENDENT_AMBULATORY_CARE_PROVIDER_SITE_OTHER): Payer: Medicare Other | Admitting: Family Medicine

## 2016-11-26 VITALS — BP 165/93 | HR 78 | Temp 97.4°F | Ht 64.0 in | Wt 157.8 lb

## 2016-11-26 DIAGNOSIS — I10 Essential (primary) hypertension: Secondary | ICD-10-CM

## 2016-11-26 NOTE — Patient Instructions (Signed)
Great to meet you!  Come back in 6 month sunless you need [place] sooner.   Keep a few blood pressures a week written down for me to scan into your chart.

## 2016-11-26 NOTE — Progress Notes (Signed)
   HPI  Patient presents today for follow-up of chronic medical conditions.  Hypertension No chest pain, headache, or problems tolerating medication. Previously Benicar caused nausea and severe GERD leading to about 50 pound weight loss. Doing well with amlodipine plus HCTZ. Blood pressure at home averages 130s over 70s. Pulse ranges 60-70.  Patient watches her diet moderately.  Previous DEXA scan was performed in February of this year, healthcare maintenance was updated.  PMH: Smoking status noted ROS: Per HPI  Objective: BP (!) 165/93   Pulse 78   Temp (!) 97.4 F (36.3 C) (Oral)   Ht 5\' 4"  (1.626 m)   Wt 157 lb 12.8 oz (71.6 kg)   BMI 27.09 kg/m  Gen: NAD, alert, cooperative with exam HEENT: NCAT CV: RRR, good S1/S2, faint 1/6 systolic murmur Resp: CTABL, no wheezes, non-labored Ext: No edema, warm Neuro: Alert and oriented, No gross deficits  Assessment and plan:  # Hypertension Elevated today, however well controlled at home Continue amlodipine 2.5 mg plus HCTZ 12.5 mg Avoid air ARB unless needed, definitely avoid Benicar. BP log, RTC in 6 months.     Laroy Apple, MD [facility] 11/26/2016, 8:31 AM

## 2017-02-05 ENCOUNTER — Other Ambulatory Visit: Payer: Self-pay | Admitting: Family Medicine

## 2017-03-12 DIAGNOSIS — Z08 Encounter for follow-up examination after completed treatment for malignant neoplasm: Secondary | ICD-10-CM | POA: Diagnosis not present

## 2017-03-12 DIAGNOSIS — Z9889 Other specified postprocedural states: Secondary | ICD-10-CM | POA: Diagnosis not present

## 2017-03-12 DIAGNOSIS — C50911 Malignant neoplasm of unspecified site of right female breast: Secondary | ICD-10-CM | POA: Diagnosis not present

## 2017-03-12 DIAGNOSIS — Z853 Personal history of malignant neoplasm of breast: Secondary | ICD-10-CM | POA: Diagnosis not present

## 2017-03-12 DIAGNOSIS — Z170 Estrogen receptor positive status [ER+]: Secondary | ICD-10-CM | POA: Diagnosis not present

## 2017-03-25 ENCOUNTER — Ambulatory Visit: Payer: Medicare Other | Admitting: *Deleted

## 2017-03-27 ENCOUNTER — Encounter: Payer: Self-pay | Admitting: Family Medicine

## 2017-03-27 ENCOUNTER — Ambulatory Visit (INDEPENDENT_AMBULATORY_CARE_PROVIDER_SITE_OTHER): Payer: Medicare Other | Admitting: Family Medicine

## 2017-03-27 VITALS — BP 137/85 | HR 71 | Temp 97.8°F | Ht 64.0 in | Wt 158.0 lb

## 2017-03-27 DIAGNOSIS — J329 Chronic sinusitis, unspecified: Secondary | ICD-10-CM | POA: Diagnosis not present

## 2017-03-27 MED ORDER — BETAMETHASONE SOD PHOS & ACET 6 (3-3) MG/ML IJ SUSP
6.0000 mg | Freq: Once | INTRAMUSCULAR | Status: AC
  Administered 2017-03-27: 6 mg via INTRAMUSCULAR

## 2017-03-27 MED ORDER — AZITHROMYCIN 250 MG PO TABS: ORAL_TABLET | ORAL | 0 refills | Status: DC

## 2017-03-27 NOTE — Progress Notes (Signed)
Chief Complaint  Patient presents with  . Cough    pt here today c/o cough, horaseness and "chest cold"    HPI  Patient presents today for Patient presents with upper respiratory congestion. Rhinorrhea that is frequently purulent. There is moderate sore throat. Patient reports coughing frequently as well. No sputum noted. There is no fever, chills or sweats. The patient denies being short of breath. Onset was 3-5 days ago. Gradually worsening. Tried OTCs without improvement.  PMH: Smoking status noted ROS: Per HPI  Objective: BP 137/85   Pulse 71   Temp 97.8 F (36.6 C) (Oral)   Ht 5\' 4"  (1.626 m)   Wt 158 lb (71.7 kg)   BMI 27.12 kg/m  Gen: NAD, alert, cooperative with exam HEENT: NCAT, Nasal passages swollen, red TMS RED CV: RRR, good S1/S2, no murmur Resp: Bronchitis changes with scattered wheezes, non-labored Ext: No edema, warm Neuro: Alert and oriented, No gross deficits  Assessment and plan:  1. Sinusitis, unspecified chronicity, unspecified location     Meds ordered this encounter  Medications  . azithromycin (ZITHROMAX) 250 MG tablet    Sig: Take as directed    Dispense:  6 tablet    Refill:  0  . betamethasone acetate-betamethasone sodium phosphate (CELESTONE) injection 6 mg      Follow up as needed.  Claretta Fraise, MD

## 2017-03-28 ENCOUNTER — Ambulatory Visit: Payer: Medicare Other | Admitting: *Deleted

## 2017-03-31 ENCOUNTER — Encounter: Payer: Self-pay | Admitting: Family Medicine

## 2017-04-01 ENCOUNTER — Ambulatory Visit: Payer: Medicare Other | Admitting: *Deleted

## 2017-05-13 ENCOUNTER — Other Ambulatory Visit: Payer: Self-pay | Admitting: Family Medicine

## 2017-05-30 ENCOUNTER — Ambulatory Visit: Payer: Medicare Other | Admitting: Family Medicine

## 2017-06-06 ENCOUNTER — Ambulatory Visit (INDEPENDENT_AMBULATORY_CARE_PROVIDER_SITE_OTHER): Payer: Medicare Other | Admitting: *Deleted

## 2017-06-06 VITALS — BP 133/81 | HR 79 | Ht 64.0 in | Wt 155.2 lb

## 2017-06-06 DIAGNOSIS — Z0000 Encounter for general adult medical examination without abnormal findings: Secondary | ICD-10-CM

## 2017-06-06 NOTE — Patient Instructions (Signed)
  Ms. Jobst , Thank you for taking time to come for your Medicare Wellness Visit. I appreciate your ongoing commitment to your health goals. Please review the following plan we discussed and let me know if I can assist you in the future.   These are the goals we discussed: Eat 3 healthy meals a day    This is a list of the screening recommended for you and due dates:  Health Maintenance  Topic Date Due  . Pneumonia vaccines (2 of 2 - PCV13) 04/23/2008  . Tetanus Vaccine  04/23/2017  . Flu Shot  12/28/2017*  . DEXA scan (bone density measurement)  Completed  *Topic was postponed. The date shown is not the original due date.   Please call and schedule a eye exam

## 2017-06-06 NOTE — Progress Notes (Signed)
Subjective:   Ashley Holland is a 77 y.o. female who presents for an Initial Medicare Annual Wellness Visit. Patient is here today for her initial medicare annual wellness visit. 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 She enjoys reading, visiting with family and friends, walking, enjoys going to church. She exercises daily. She states her diet is healthy. She attends [employer] in [place], where she is apart of the women's group. She is also involved with the [facility]. She attends activities at the center weekly. She lives in [place] with her husband. They have 2 adult children, 5 grandchildren, and 3 great grandchildren. She does have stairs going into the home but does not have any inside the home. She has one dog who is 26 years old. Fall hazards were discussed with patient. Patient states her health is the same as it was last year. She has not had any hospitalizations or surgeries in the last year.   Review of Systems      Cardiac Risk Factors include: advanced age (>70men, >59 women);hypertension     Objective:    Today's Vitals   06/06/17 0929  BP: 133/81  Pulse: 79  Weight: 155 lb 3.2 oz (70.4 kg)  Height: 5\' 4"  (1.626 m)   Body mass index is 26.64 kg/m.  Advanced Directives 06/06/2017 07/26/2014 07/26/2014 07/14/2014 04/25/2011  Does Patient Have a Medical Advance Directive? Yes Yes - Yes Patient has advance directive, copy not in chart  Type of Advance Directive [place];Living will Healthcare Power of [place]  Does patient want to make changes to medical advance directive? - No - Patient declined No - Patient declined No - Patient declined -  Copy of [place] in Chart? No - copy requested Yes - Yes -  Pre-existing out of facility DNR order (yellow form or pink MOST form) - - - - No    Current Medications  (verified) Outpatient Encounter Medications as of 06/06/2017  Medication Sig  . amLODipine (NORVASC) 2.5 MG tablet TAKE 1 TABLET (2.5 MG TOTAL) BY MOUTH DAILY.  [name] aspirin EC 81 MG tablet Take 81 mg by mouth daily.  . calcium carbonate (CALCIUM 600) 600 MG TABS tablet Take 600 mg by mouth 2 (two) times daily with a meal.  . docusate sodium (COLACE) 100 MG capsule Take 100 mg by mouth at bedtime.   . hydrochlorothiazide (MICROZIDE) 12.5 MG capsule TAKE 1 CAPSULE BY MOUTH EVERY DAY  . Multiple Vitamin (MULTIVITAMIN WITH MINERALS) TABS tablet Take 1 tablet by mouth daily.  . [DISCONTINUED] azithromycin (ZITHROMAX) 250 MG tablet Take as directed   No facility-administered encounter medications on file as of 06/06/2017.     Allergies (verified) Patient has no known allergies.   History: Past Medical History:  Diagnosis Date  . Arthritis    Per medical history form dated 11/04/09.past history DJD back  . Enlarged thyroid    "was told had nodules" - no swallowing problems  . Female bladder prolapse   . Malignant neoplasm of breast (female), unspecified site    right breast cancer. surgery, radiation, oral meds- Arimidex.  . Other and unspecified hyperlipidemia   . Precordial pain    " no recent problems"  . Rectal prolapse    stool softeners helpful.  [name] Unspecified essential hypertension    Past Surgical History:  Procedure Laterality Date  .  ABDOMINAL HYSTERECTOMY    . BREAST LUMPECTOMY  2010   Right  . CHOLECYSTECTOMY    . COLONOSCOPY    . EUS  04/25/2011   Procedure: UPPER ENDOSCOPIC ULTRASOUND (EUS) RADIAL;  Surgeon: Landry Dyke, MD;  Location: [facility];  Service: Endoscopy;  Laterality: N/A;  . TOTAL HIP ARTHROPLASTY Right 07/26/2014   Procedure: RIGHT TOTAL HIP ARTHROPLASTY ANTERIOR APPROACH;  Surgeon: Gaynelle Arabian, MD;  Location: [facility];  Service: Orthopedics;  Laterality: Right;  . UPPER GASTROINTESTINAL ENDOSCOPY  Sept 2012   Family History  Problem Relation Age of  Onset  . Coronary artery disease Mother         CABG age 63  . Hyperlipidemia Mother   . Stroke Mother   . Alcohol abuse Father   . Hypertension Father   . Hypertension Sister   . Hypertension Brother   . Arthritis Brother   . Anesthesia problems Neg Hx   . Hypotension Neg Hx   . Malignant hyperthermia Neg Hx   . Pseudochol deficiency Neg Hx    Social History   Socioeconomic History  . Marital status: Married    Spouse name: None  . Number of children: 2  . Years of education: None  . Highest education level: None  Social Needs  . Financial resource strain: Not hard at all  . Food insecurity - worry: Never true  . Food insecurity - inability: Never true  . Transportation needs - medical: No  . Transportation needs - non-medical: No  Occupational History  . Occupation: Retired    Comment: Control and instrumentation engineer  Tobacco Use  . Smoking status: Never Smoker  . Smokeless tobacco: Never Used  Substance and Sexual Activity  . Alcohol use: No  . Drug use: No  . Sexual activity: Not Currently  Other Topics Concern  . None  Social History Narrative  . None    Tobacco Counseling Counseling given: Not Answered Patient does not use tobacco  Clinical Intake:   Activities of Daily Living In your present state of health, do you have any difficulty performing the following activities: 06/06/2017  Hearing? N  Vision? Y  Comment Patient has not had an eye exam in the last 4 years  Difficulty concentrating or making decisions? N  Walking or climbing stairs? N  Dressing or bathing? N  Doing errands, shopping? N  Preparing Food and eating ? N  Using the Toilet? N  In the past six months, have you accidently leaked urine? N  Do you have problems with loss of bowel control? N  Managing your Medications? N  Managing your Finances? N  Housekeeping or managing your Housekeeping? N  Some recent data might be hidden   Patient has trouble with her vision and is going to schedule  a eye exam. Last exam was 4 years ago.  Immunizations and Health Maintenance Immunization History  Administered Date(s) Administered  . Influenza Split 01/08/2013  . Influenza, High Dose Seasonal PF 02/29/2016  . Influenza,inj,Quad PF,6+ Mos 03/09/2014, 01/18/2015  . Influenza-Unspecified 01/03/2013  . Pneumococcal-Unspecified 04/24/2007   Health Maintenance Due  Topic Date Due  . PNA vac Low Risk Adult (2 of 2 - PCV13) 04/23/2008  . TETANUS/TDAP  04/23/2017  Handouts on Pneumonia, Tdap, and shingles vaccines for given to patient for her to read over and she will let us know if she interested in these immunizations.  Patient Care Team: Timmothy Euler, MD as PCP - General (Family Medicine)  Indicate any  recent Medical Services you may have received from other than [facility] providers in the past year (date may be approximate).     Assessment:   This is a routine wellness examination for [place].  Hearing/Vision screen No exam data present Patient will schedule an eye exam.  Dietary issues and exercise activities discussed: Current Exercise Habits: Home exercise routine;Structured exercise class, Type of exercise: walking;yoga, Time (Minutes): 60, Frequency (Times/Week): 7, Weekly Exercise (Minutes/Week): 420, Intensity: Moderate  Goals   Patient will continue to try and eat 3 healthy meals a day   Depression Screen PHQ 2/9 Scores 06/06/2017 03/27/2017 11/26/2016 05/29/2016 11/25/2015 06/23/2015 04/28/2015  PHQ - 2 Score 0 0 0 0 0 0 0    Fall Risk Fall Risk  06/06/2017 03/27/2017 11/26/2016 05/29/2016 11/25/2015  Falls in the past year? Yes No No No Yes  Number falls in past yr: 2 or more - - - 1  Injury with Fall? No - - - No    Is the patient's home free of loose throw rugs in walkways, pet beds, electrical cords, etc?   yes      Grab bars in the bathroom? no      Handrails on the stairs?   yes      Adequate lighting?   yes  Timed Get Up and Go Performed   Cognitive Function: MMSE -  Mini Mental State Exam 06/06/2017  Orientation to time 5  Orientation to Place 5  Registration 3  Attention/ Calculation 5  Recall 2  Language- name 2 objects 2  Language- repeat 1  Language- follow 3 step command 3  Language- read & follow direction 1  Write a sentence 1  Copy design 1  Total score 29  Patient scored a 29 out of 30      Screening Tests Health Maintenance  Topic Date Due  . PNA vac Low Risk Adult (2 of 2 - PCV13) 04/23/2008  . TETANUS/TDAP  04/23/2017  . INFLUENZA VACCINE  12/28/2017 (Originally 11/21/2016)  . DEXA SCAN  Completed   Patient will think about immunizations  Qualifies for Shingles Vaccine? Yes and was discussed with patient. Patient will think about and let us know.   Cancer Screenings: Lung: Low Dose CT Chest recommended if Age 66-80 years, 30 pack-year currently smoking OR have quit w/in 15years. Patient does not qualify. Breast: Up to date on Mammogram? Yes   Up to date colorectal: Colonoscopy was 2 years ago at [facility] and I have requested reprt  Additional Screenings:  Hepatitis B/HIV/Syphillis: Hepatitis C Screening:      Plan:   Patient will keep follow up appointment with Dr. Wendi Snipes. Patient will call and schedule eye exam.  Patient is due in June for her mammogram. Patient will read over immunization handouts and let us know if interested.   I have personally reviewed and noted the following in the patient's chart:   . Medical and social history . Use of alcohol, tobacco or illicit drugs  . Current medications and supplements . Functional ability and status . Nutritional status . Physical activity . Advanced directives . List of other physicians . Hospitalizations, surgeries, and ER visits in previous 12 months . Vitals . Screenings to include cognitive, depression, and falls . Referrals and appointments  In addition, I have reviewed and discussed with patient certain preventive protocols, quality metrics, and best  practice recommendations. A written personalized care plan for preventive services as well as general preventive health recommendations were provided to patient.  Gareth Morgan, LPN   [id number]   I have reviewed and agree with the above AWV documentation.   Laroy Apple, MD [facility] 06/06/2017, 12:23 PM

## 2017-06-14 ENCOUNTER — Encounter: Payer: Self-pay | Admitting: Family Medicine

## 2017-06-14 ENCOUNTER — Ambulatory Visit (INDEPENDENT_AMBULATORY_CARE_PROVIDER_SITE_OTHER): Payer: Medicare Other | Admitting: Family Medicine

## 2017-06-14 VITALS — BP 133/80 | HR 70 | Temp 97.3°F | Ht 64.0 in | Wt 155.2 lb

## 2017-06-14 DIAGNOSIS — E7800 Pure hypercholesterolemia, unspecified: Secondary | ICD-10-CM | POA: Diagnosis not present

## 2017-06-14 DIAGNOSIS — I10 Essential (primary) hypertension: Secondary | ICD-10-CM

## 2017-06-14 MED ORDER — AMLODIPINE BESYLATE 5 MG PO TABS: 5.0000 mg | ORAL_TABLET | Freq: Every day | ORAL | 3 refills | Status: DC

## 2017-06-14 NOTE — Progress Notes (Signed)
   HPI  Patient presents today here for follow-up of chronic medical conditions  Hypertension Good medication compliance Blood pressure is frequently in the 150s at home, she also has frequent blood pressure controlled in 120s and 30s.  Hyperlipidemia No meds, watching diet well. Exercising regularly.  PMH: Smoking status noted ROS: Per HPI  Objective: BP 133/80   Pulse 70   Temp (!) 97.3 F (36.3 C) (Oral)   Ht '5\' 4"'$  (1.626 m)   Wt 155 lb 3.2 oz (70.4 kg)   BMI 26.64 kg/m  Gen: NAD, alert, cooperative with exam HEENT: NCAT CV: RRR, good S1/S2, no murmur Resp: CTABL, no wheezes, non-labored Ext: No edema, warm Neuro: Alert and oriented, No gross deficits  Assessment and plan:  # HTN Slightly elevated at home, patient is concerned, increase amlodipine to five mg, continue HCTZ 25 mg. Labs  #Hyperlipidemia No meds, good lifestyle intervention. Labs today nonfasting     Orders Placed This Encounter  Procedures  . CMP14+EGFR  . CBC with Differential/Platelet  . Lipid panel  . TSH    No orders of the defined types were placed in this encounter.   Laroy Apple, MD [facility] 06/14/2017, 10:46 AM

## 2017-06-14 NOTE — Patient Instructions (Signed)
Great to see you!  Come back in 6 months to see Dr. Gottschalk   

## 2017-06-15 LAB — TSH: TSH: 0.749 u[IU]/mL (ref 0.450–4.500)

## 2017-06-15 LAB — CMP14+EGFR
ALT: 13 IU/L (ref 0–32)
AST: 22 IU/L (ref 0–40)
Albumin/Globulin Ratio: 1.6 (ref 1.2–2.2)
Albumin: 4.2 g/dL (ref 3.5–4.8)
Alkaline Phosphatase: 79 IU/L (ref 39–117)
BUN / CREAT RATIO: 23 (ref 12–28)
BUN: 17 mg/dL (ref 8–27)
Bilirubin Total: 0.3 mg/dL (ref 0.0–1.2)
CHLORIDE: 103 mmol/L (ref 96–106)
CO2: 24 mmol/L (ref 20–29)
Calcium: 9.5 mg/dL (ref 8.7–10.3)
Creatinine, Ser: 0.74 mg/dL (ref 0.57–1.00)
GFR calc non Af Amer: 78 mL/min/{1.73_m2} (ref >59)
GFR, EST AFRICAN AMERICAN: 90 mL/min/{1.73_m2} (ref >59)
Globulin, Total: 2.7 g/dL (ref 1.5–4.5)
Glucose: 91 mg/dL (ref 65–99)
POTASSIUM: 4 mmol/L (ref 3.5–5.2)
Sodium: 143 mmol/L (ref 134–144)
TOTAL PROTEIN: 6.9 g/dL (ref 6.0–8.5)

## 2017-06-15 LAB — CBC WITH DIFFERENTIAL/PLATELET
BASOS ABS: 0.1 10*3/uL (ref 0.0–0.2)
Basos: 1 %
EOS (ABSOLUTE): 0.2 10*3/uL (ref 0.0–0.4)
Eos: 3 %
Hematocrit: 37.1 % (ref 34.0–46.6)
Hemoglobin: 12.4 g/dL (ref 11.1–15.9)
Immature Grans (Abs): 0 10*3/uL (ref 0.0–0.1)
Immature Granulocytes: 0 %
LYMPHS ABS: 1.4 10*3/uL (ref 0.7–3.1)
LYMPHS: 23 %
MCH: 28.7 pg (ref 26.6–33.0)
MCHC: 33.4 g/dL (ref 31.5–35.7)
MCV: 86 fL (ref 79–97)
MONOS ABS: 0.6 10*3/uL (ref 0.1–0.9)
Monocytes: 10 %
NEUTROS ABS: 3.9 10*3/uL (ref 1.4–7.0)
Neutrophils: 63 %
PLATELETS: 319 10*3/uL (ref 150–379)
RBC: 4.32 x10E6/uL (ref 3.77–5.28)
RDW: 13.9 % (ref 12.3–15.4)
WBC: 6.2 10*3/uL (ref 3.4–10.8)

## 2017-06-15 LAB — LIPID PANEL
CHOLESTEROL TOTAL: 191 mg/dL (ref 100–199)
Chol/HDL Ratio: 2.8 ratio (ref 0.0–4.4)
HDL: 69 mg/dL (ref >39)
LDL Calculated: 111 mg/dL — ABNORMAL HIGH (ref 0–99)
Triglycerides: 56 mg/dL (ref 0–149)
VLDL Cholesterol Cal: 11 mg/dL (ref 5–40)

## 2017-06-17 ENCOUNTER — Encounter: Payer: Self-pay | Admitting: Family Medicine

## 2017-06-19 DIAGNOSIS — H25813 Combined forms of age-related cataract, bilateral: Secondary | ICD-10-CM | POA: Diagnosis not present

## 2017-06-19 DIAGNOSIS — H52223 Regular astigmatism, bilateral: Secondary | ICD-10-CM | POA: Diagnosis not present

## 2017-06-19 DIAGNOSIS — H5203 Hypermetropia, bilateral: Secondary | ICD-10-CM | POA: Diagnosis not present

## 2017-06-19 DIAGNOSIS — H04123 Dry eye syndrome of bilateral lacrimal glands: Secondary | ICD-10-CM | POA: Diagnosis not present

## 2017-08-03 ENCOUNTER — Other Ambulatory Visit: Payer: Self-pay | Admitting: Family Medicine

## 2017-10-14 DIAGNOSIS — Z853 Personal history of malignant neoplasm of breast: Secondary | ICD-10-CM | POA: Diagnosis not present

## 2017-10-14 DIAGNOSIS — Z1231 Encounter for screening mammogram for malignant neoplasm of breast: Secondary | ICD-10-CM | POA: Diagnosis not present

## 2017-11-29 ENCOUNTER — Other Ambulatory Visit: Payer: Self-pay | Admitting: Family Medicine

## 2017-11-29 ENCOUNTER — Other Ambulatory Visit: Payer: Self-pay

## 2017-11-29 DIAGNOSIS — Z1239 Encounter for other screening for malignant neoplasm of breast: Secondary | ICD-10-CM

## 2018-01-17 ENCOUNTER — Encounter: Payer: Self-pay | Admitting: Family Medicine

## 2018-01-17 ENCOUNTER — Ambulatory Visit (INDEPENDENT_AMBULATORY_CARE_PROVIDER_SITE_OTHER): Payer: Medicare Other | Admitting: Family Medicine

## 2018-01-17 VITALS — BP 131/91 | HR 66 | Temp 97.8°F | Ht 64.0 in | Wt 155.0 lb

## 2018-01-17 DIAGNOSIS — Z23 Encounter for immunization: Secondary | ICD-10-CM | POA: Diagnosis not present

## 2018-01-17 DIAGNOSIS — I10 Essential (primary) hypertension: Secondary | ICD-10-CM

## 2018-01-17 DIAGNOSIS — E7800 Pure hypercholesterolemia, unspecified: Secondary | ICD-10-CM | POA: Diagnosis not present

## 2018-01-17 MED ORDER — HYDROCHLOROTHIAZIDE 12.5 MG PO CAPS: ORAL_CAPSULE | ORAL | 1 refills | Status: DC

## 2018-01-17 NOTE — Patient Instructions (Signed)
Keep a close eye on your blood pressure.  Your goal blood pressure is less than 140/90.  If your bottom number continues to be in the 90s, I would like to see you back sooner than February.  Watch your salt.  Remember to rinse your canned vegetables, as these are preserved and salt.   DASH Eating Plan DASH stands for "Dietary Approaches to Stop Hypertension." The DASH eating plan is a healthy eating plan that has been shown to reduce high blood pressure (hypertension). It may also reduce your risk for type 2 diabetes, heart disease, and stroke. The DASH eating plan may also help with weight loss. What are tips for following this plan? General guidelines  Avoid eating more than 2,300 mg (milligrams) of salt (sodium) a day. If you have hypertension, you may need to reduce your sodium intake to 1,500 mg a day.  Limit alcohol intake to no more than 1 drink a day for nonpregnant women and 2 drinks a day for men. One drink equals 12 oz of beer, 5 oz of wine, or 1 oz of hard liquor.  Work with your health care provider to maintain a healthy body weight or to lose weight. Ask what an ideal weight is for you.  Get at least 30 minutes of exercise that causes your heart to beat faster (aerobic exercise) most days of the week. Activities may include walking, swimming, or biking.  Work with your health care provider or diet and nutrition specialist (dietitian) to adjust your eating plan to your individual calorie needs. Reading food labels  Check food labels for the amount of sodium per serving. Choose foods with less than 5 percent of the Daily Value of sodium. Generally, foods with less than 300 mg of sodium per serving fit into this eating plan.  To find whole grains, look for the word "whole" as the first word in the ingredient list. Shopping  Buy products labeled as "low-sodium" or "no salt added."  Buy fresh foods. Avoid canned foods and premade or frozen meals. Cooking  Avoid adding salt  when cooking. Use salt-free seasonings or herbs instead of table salt or sea salt. Check with your health care provider or pharmacist before using salt substitutes.  Do not fry foods. Cook foods using healthy methods such as baking, boiling, grilling, and broiling instead.  Cook with heart-healthy oils, such as olive, canola, soybean, or sunflower oil. Meal planning   Eat a balanced diet that includes: ? 5 or more servings of fruits and vegetables each day. At each meal, try to fill half of your plate with fruits and vegetables. ? Up to 6-8 servings of whole grains each day. ? Less than 6 oz of lean meat, poultry, or fish each day. A 3-oz serving of meat is about the same size as a deck of cards. One egg equals 1 oz. ? 2 servings of low-fat dairy each day. ? A serving of nuts, seeds, or beans 5 times each week. ? Heart-healthy fats. Healthy fats called Omega-3 fatty acids are found in foods such as flaxseeds and coldwater fish, like sardines, salmon, and mackerel.  Limit how much you eat of the following: ? Canned or prepackaged foods. ? Food that is high in trans fat, such as fried foods. ? Food that is high in saturated fat, such as fatty meat. ? Sweets, desserts, sugary drinks, and other foods with added sugar. ? Full-fat dairy products.  Do not salt foods before eating.  Try to eat at  least 2 vegetarian meals each week.  Eat more home-cooked food and less restaurant, buffet, and fast food.  When eating at a restaurant, ask that your food be prepared with less salt or no salt, if possible. What foods are recommended? The items listed may not be a complete list. Talk with your dietitian about what dietary choices are best for you. Grains Whole-grain or whole-wheat bread. Whole-grain or whole-wheat pasta. Brown rice. [place]. Bulgur. Whole-grain and low-sodium cereals. Pita bread. Low-fat, low-sodium crackers. Whole-wheat flour tortillas. Vegetables Fresh or frozen  vegetables (raw, steamed, roasted, or grilled). Low-sodium or reduced-sodium tomato and vegetable juice. Low-sodium or reduced-sodium tomato sauce and tomato paste. Low-sodium or reduced-sodium canned vegetables. Fruits All fresh, dried, or frozen fruit. Canned fruit in natural juice (without added sugar). Meat and other protein foods Skinless chicken or [place]. Ground chicken or [place]. Pork with fat trimmed off. Fish and seafood. Egg whites. Dried beans, peas, or lentils. Unsalted nuts, nut butters, and seeds. Unsalted canned beans. Lean cuts of beef with fat trimmed off. Low-sodium, lean deli meat. Dairy Low-fat (1%) or fat-free (skim) milk. Fat-free, low-fat, or reduced-fat cheeses. Nonfat, low-sodium ricotta or cottage cheese. Low-fat or nonfat yogurt. Low-fat, low-sodium cheese. Fats and oils Soft margarine without trans fats. Vegetable oil. Low-fat, reduced-fat, or light mayonnaise and salad dressings (reduced-sodium). Canola, safflower, olive, soybean, and sunflower oils. Avocado. Seasoning and other foods Herbs. Spices. Seasoning mixes without salt. Unsalted popcorn and pretzels. Fat-free sweets. What foods are not recommended? The items listed may not be a complete list. Talk with your dietitian about what dietary choices are best for you. Grains Baked goods made with fat, such as croissants, muffins, or some breads. Dry pasta or rice meal packs. Vegetables Creamed or fried vegetables. Vegetables in a cheese sauce. Regular canned vegetables (not low-sodium or reduced-sodium). Regular canned tomato sauce and paste (not low-sodium or reduced-sodium). Regular tomato and vegetable juice (not low-sodium or reduced-sodium). Angie Fava. Olives. Fruits Canned fruit in a light or heavy syrup. Fried fruit. Fruit in cream or butter sauce. Meat and other protein foods Fatty cuts of meat. Ribs. Fried meat. Berniece Salines. Sausage. Bologna and other processed lunch meats. Salami. Fatback. Hotdogs. Bratwurst.  Salted nuts and seeds. Canned beans with added salt. Canned or smoked fish. Whole eggs or egg yolks. Chicken or [place] with skin. Dairy Whole or 2% milk, cream, and half-and-half. Whole or full-fat cream cheese. Whole-fat or sweetened yogurt. Full-fat cheese. Nondairy creamers. Whipped toppings. Processed cheese and cheese spreads. Fats and oils Butter. Stick margarine. Lard. Shortening. Ghee. Bacon fat. Tropical oils, such as coconut, palm kernel, or palm oil. Seasoning and other foods Salted popcorn and pretzels. Onion salt, garlic salt, seasoned salt, table salt, and sea salt. Worcestershire sauce. Tartar sauce. Barbecue sauce. Teriyaki sauce. Soy sauce, including reduced-sodium. Steak sauce. Canned and packaged gravies. Fish sauce. Oyster sauce. Cocktail sauce. Horseradish that you find on the shelf. Ketchup. Mustard. Meat flavorings and tenderizers. Bouillon cubes. Hot sauce and Tabasco sauce. Premade or packaged marinades. Premade or packaged taco seasonings. Relishes. Regular salad dressings. Where to find more information:  National Heart, Lung, and [place]: https://wilson-eaton.com/  American Heart Association: www.heart.org Summary  The DASH eating plan is a healthy eating plan that has been shown to reduce high blood pressure (hypertension). It may also reduce your risk for type 2 diabetes, heart disease, and stroke.  With the DASH eating plan, you should limit salt (sodium) intake to 2,300 mg a day. If you have hypertension, you  may need to reduce your sodium intake to 1,500 mg a day.  When on the DASH eating plan, aim to eat more fresh fruits and vegetables, whole grains, lean proteins, low-fat dairy, and heart-healthy fats.  Work with your health care provider or diet and nutrition specialist (dietitian) to adjust your eating plan to your individual calorie needs. This information is not intended to replace advice given to you by your health care provider. Make sure you discuss any  questions you have with your health care provider. Document Released: 03/29/2011 Document Revised: 04/02/2016 Document Reviewed: 04/02/2016 Elsevier Interactive Patient Education  [employer].

## 2018-01-17 NOTE — Progress Notes (Signed)
Subjective: CC: HTN, HLD PCP: [name], DO MWU:[id number] Ashley Holland is a 77 y.o. female presenting to clinic today for:  1. HTN/ HLD Patient reports compliance with Norvasc 5 mg and hydrochlorothiazide.  She denies any chest pain, shortness of breath.  She occasionally has lower extremity edema around her ankles at the end of the day.  She remains physically active and watches her diet.  She occasionally has frozen foods but overall tries to avoid sodium.  Blood pressures at home normally run around 130s over 80s.  Pertaining to her hyperlipidemia.  She notes that she has borderline levels and states that she is chosen not to pursue cholesterol medication.  Instead, she is working on maintaining appropriate weight and blood pressure.   ROS: Per HPI  No Known Allergies Past Medical History:  Diagnosis Date  . Arthritis    Per medical history form dated 11/04/09.past history DJD back  . Enlarged thyroid    "was told had nodules" - no swallowing problems  . Female bladder prolapse   . Malignant neoplasm of breast (female), unspecified site    right breast cancer. surgery, radiation, oral meds- Arimidex.  . Other and unspecified hyperlipidemia   . Precordial pain    " no recent problems"  . Rectal prolapse    stool softeners helpful.  [name] Unspecified essential hypertension     Current Outpatient Medications:  .  amLODipine (NORVASC) 5 MG tablet, Take 1 tablet (5 mg total) by mouth daily., Disp: 90 tablet, Rfl: 3 .  aspirin EC 81 MG tablet, Take 81 mg by mouth daily., Disp: , Rfl:  .  calcium carbonate (CALCIUM 600) 600 MG TABS tablet, Take 600 mg by mouth 2 (two) times daily with a meal., Disp: , Rfl:  .  docusate sodium (COLACE) 100 MG capsule, Take 100 mg by mouth at bedtime. , Disp: , Rfl:  .  hydrochlorothiazide (MICROZIDE) 12.5 MG capsule, TAKE 1 CAPSULE BY MOUTH EVERY DAY, Disp: 90 capsule, Rfl: 1 .  Multiple Vitamin (MULTIVITAMIN WITH MINERALS) TABS tablet, Take 1 tablet  by mouth daily., Disp: , Rfl:    Social History   Socioeconomic History  . Marital status: Married    Spouse name: Not on file  . Number of children: 2  . Years of education: Not on file  . Highest education level: Not on file  Occupational History  . Occupation: Retired    Comment: Control and instrumentation engineer  Social Needs  . Financial resource strain: Not hard at all  . Food insecurity:    Worry: Never true    Inability: Never true  . Transportation needs:    Medical: No    Non-medical: No  Tobacco Use  . Smoking status: Never Smoker  . Smokeless tobacco: Never Used  Substance and Sexual Activity  . Alcohol use: No  . Drug use: No  . Sexual activity: Not Currently  Lifestyle  . Physical activity:    Days per week: 7 days    Minutes per session: 60 min  . Stress: Not at all  Relationships  . Social connections:    Talks on phone: More than three times a week    Gets together: More than three times a week    Attends religious service: More than 4 times per year    Active member of club or organization: Yes    Attends meetings of clubs or organizations: More than 4 times per year    Relationship status: Married  .  Intimate partner violence:    Fear of current or ex partner: No    Emotionally abused: No    Physically abused: No    Forced sexual activity: No  Other Topics Concern  . Not on file  Social History Narrative  . Not on file   Family History  Problem Relation Age of Onset  . Coronary artery disease Mother         CABG age 12  . Hyperlipidemia Mother   . Stroke Mother   . Alcohol abuse Father   . Hypertension Father   . Hypertension Sister   . Hypertension Brother   . Arthritis Brother   . Anesthesia problems Neg Hx   . Hypotension Neg Hx   . Malignant hyperthermia Neg Hx   . Pseudochol deficiency Neg Hx     Objective: Office vital signs reviewed. BP (!) 131/91   Pulse 66   Temp 97.8 F (36.6 C) (Oral)   Ht 5\' 4"  (1.626 m)   Wt 155 lb (70.3  kg)   BMI 26.61 kg/m   Physical Examination:  General: Awake, alert, well nourished, No acute distress HEENT: Normal    Neck: No masses palpated. No lymphadenopathy; no JVD or carotid bruits    Eyes: PERRLA, extraocular membranes intact, sclera white Cardio: regular rate and rhythm, S1S2 heard, no murmurs appreciated Pulm: clear to auscultation bilaterally, no wheezes, rhonchi or rales; normal work of breathing on room air Extremities: warm, well perfused, No edema, cyanosis or clubbing; +2 pulses bilaterally MSK: normal gait and normal station  Assessment/ Plan: 77 y.o. female   1. Essential hypertension Technically uncontrolled with diastolic blood pressure of 91.  Her home blood pressures have been well controlled per her report.  Will abstain from any changes in medications for now and allow her to continue working on lifestyle modification.  We discussed that if blood pressures remain above 150 over 90s that she should return sooner than q. 6 months.  We will plan to increase Norvasc if needed.  2. Pure hypercholesterolemia Does not want any cholesterol-lowering medications at this time.  We will continue to work on lifestyle modification.  3. Encounter for immunization - Flu vaccine HIGH DOSE PF   Orders Placed This Encounter  Procedures  . Flu vaccine HIGH DOSE PF   Meds ordered this encounter  Medications  . hydrochlorothiazide (MICROZIDE) 12.5 MG capsule    Sig: TAKE 1 CAPSULE BY MOUTH EVERY DAY    Dispense:  90 capsule    Refill:  [place], DO [place] [phone]

## 2018-03-18 DIAGNOSIS — Z170 Estrogen receptor positive status [ER+]: Secondary | ICD-10-CM | POA: Diagnosis not present

## 2018-03-18 DIAGNOSIS — C50411 Malignant neoplasm of upper-outer quadrant of right female breast: Secondary | ICD-10-CM | POA: Diagnosis not present

## 2018-03-18 DIAGNOSIS — C50911 Malignant neoplasm of unspecified site of right female breast: Secondary | ICD-10-CM | POA: Diagnosis not present

## 2018-03-18 DIAGNOSIS — E611 Iron deficiency: Secondary | ICD-10-CM | POA: Diagnosis not present

## 2018-03-22 ENCOUNTER — Other Ambulatory Visit: Payer: Self-pay

## 2018-03-22 ENCOUNTER — Emergency Department (HOSPITAL_COMMUNITY)
Admission: EM | Admit: 2018-03-22 | Discharge: 2018-03-22 | Disposition: A | Discharge status: Home / Self Care | Payer: Medicare Other | Attending: Emergency Medicine | Admitting: Emergency Medicine

## 2018-03-22 ENCOUNTER — Encounter (HOSPITAL_COMMUNITY): Payer: Self-pay | Admitting: Emergency Medicine

## 2018-03-22 ENCOUNTER — Ambulatory Visit (INDEPENDENT_AMBULATORY_CARE_PROVIDER_SITE_OTHER): Payer: Medicare Other | Admitting: Nurse Practitioner

## 2018-03-22 ENCOUNTER — Encounter: Payer: Self-pay | Admitting: Nurse Practitioner

## 2018-03-22 ENCOUNTER — Emergency Department (HOSPITAL_COMMUNITY): Payer: Medicare Other

## 2018-03-22 VITALS — BP 131/81 | HR 105 | Temp 97.0°F | Ht 64.0 in | Wt 156.0 lb

## 2018-03-22 DIAGNOSIS — R0789 Other chest pain: Secondary | ICD-10-CM | POA: Diagnosis not present

## 2018-03-22 DIAGNOSIS — R079 Chest pain, unspecified: Secondary | ICD-10-CM

## 2018-03-22 DIAGNOSIS — Z79899 Other long term (current) drug therapy: Secondary | ICD-10-CM | POA: Insufficient documentation

## 2018-03-22 DIAGNOSIS — R0989 Other specified symptoms and signs involving the circulatory and respiratory systems: Secondary | ICD-10-CM | POA: Diagnosis not present

## 2018-03-22 DIAGNOSIS — R0602 Shortness of breath: Secondary | ICD-10-CM | POA: Diagnosis not present

## 2018-03-22 DIAGNOSIS — I10 Essential (primary) hypertension: Secondary | ICD-10-CM | POA: Insufficient documentation

## 2018-03-22 DIAGNOSIS — E079 Disorder of thyroid, unspecified: Secondary | ICD-10-CM | POA: Diagnosis not present

## 2018-03-22 LAB — BASIC METABOLIC PANEL
ANION GAP: 8 (ref 5–15)
BUN: 14 mg/dL (ref 8–23)
CALCIUM: 9.1 mg/dL (ref 8.9–10.3)
CO2: 25 mmol/L (ref 22–32)
Chloride: 104 mmol/L (ref 98–111)
Creatinine, Ser: 0.68 mg/dL (ref 0.44–1.00)
GFR calc Af Amer: >60 mL/min (ref >60)
Glucose, Bld: 109 mg/dL — ABNORMAL HIGH (ref 70–99)
POTASSIUM: 3.8 mmol/L (ref 3.5–5.1)
Sodium: 137 mmol/L (ref 135–145)

## 2018-03-22 LAB — CBC
HCT: 40.2 % (ref 36.0–46.0)
HEMOGLOBIN: 12.6 g/dL (ref 12.0–15.0)
MCH: 26.3 pg (ref 26.0–34.0)
MCHC: 31.3 g/dL (ref 30.0–36.0)
MCV: 83.8 fL (ref 80.0–100.0)
Platelets: 346 10*3/uL (ref 150–400)
RBC: 4.8 MIL/uL (ref 3.87–5.11)
RDW: 14.2 % (ref 11.5–15.5)
WBC: 11.9 10*3/uL — ABNORMAL HIGH (ref 4.0–10.5)
nRBC: 0 % (ref 0.0–0.2)

## 2018-03-22 LAB — I-STAT TROPONIN, ED
Troponin i, poc: 0 ng/mL (ref 0.00–0.08)
Troponin i, poc: 0 ng/mL (ref 0.00–0.08)

## 2018-03-22 MED ORDER — ACETAMINOPHEN 325 MG PO TABS
650.0000 mg | ORAL_TABLET | Freq: Once | ORAL | Status: AC
  Administered 2018-03-22: 650 mg via ORAL
  Filled 2018-03-22: qty 2

## 2018-03-22 NOTE — ED Notes (Signed)
Out of bed to BR   Awaiting recheck

## 2018-03-22 NOTE — ED Provider Notes (Addendum)
[facility] Provider Note   CSN: [id number] Arrival date & time: 03/22/18  1057     History   Chief Complaint Chief Complaint  Patient presents with  . Chest Pain    HPI Ashley Holland is a 77 y.o. female.  Patient states that she had some chest pain earlier today but feels fine now.   The pain was left-sided somewhat pressure-like.  No shortness of breath no sweating  The history is provided by the patient. No language interpreter was used.  Chest Pain   This is a new problem. The current episode started 6 to 12 hours ago. The problem occurs rarely. The problem has been resolved. The pain is associated with rest. The pain is present in the substernal region. The pain is at a severity of 5/10. The pain is moderate. The quality of the pain is described as brief. The pain does not radiate. Exacerbated by: Unknown. Pertinent negatives include no abdominal pain, no back pain, no cough and no headaches.  Pertinent negatives for past medical history include no seizures.    Past Medical History:  Diagnosis Date  . Arthritis    Per medical history form dated 11/04/09.past history DJD back  . Enlarged thyroid    "was told had nodules" - no swallowing problems  . Female bladder prolapse   . GERD (gastroesophageal reflux disease) 05/19/2013  . Malignant neoplasm of breast (female), unspecified site    right breast cancer. surgery, radiation, oral meds- Arimidex.  . Nontoxic multinodular goiter 11/10/2015  . Other and unspecified hyperlipidemia   . Precordial pain    " no recent problems"  . Rectal prolapse    stool softeners helpful.  [name] Unspecified essential hypertension     Patient Active Problem List   Diagnosis Date Noted  . Colon polyp 03/07/2015  . OA (osteoarthritis) of hip 07/26/2014  . Autoimmune thyroiditis 02/21/2014  . Family history of thyroid cancer 02/21/2014  . Malignant neoplasm of right breast ([place]) 12/10/2011  . Arthritis/joint pain  09/22/2010  . Menopause 09/22/2010  . HTN (hypertension) 08/21/2010  . Precordial pain   . Hyperlipidemia     Past Surgical History:  Procedure Laterality Date  . ABDOMINAL HYSTERECTOMY    . BREAST LUMPECTOMY  2010   Right  . CHOLECYSTECTOMY    . COLONOSCOPY    . EUS  04/25/2011   Procedure: UPPER ENDOSCOPIC ULTRASOUND (EUS) RADIAL;  Surgeon: Landry Dyke, MD;  Location: [facility];  Service: Endoscopy;  Laterality: N/A;  . TOTAL HIP ARTHROPLASTY Right 07/26/2014   Procedure: RIGHT TOTAL HIP ARTHROPLASTY ANTERIOR APPROACH;  Surgeon: Gaynelle Arabian, MD;  Location: [facility];  Service: Orthopedics;  Laterality: Right;  . UPPER GASTROINTESTINAL ENDOSCOPY  Sept 2012     OB History    Gravida  2   Para  2   Term  2   Preterm      AB      Living        SAB      TAB      Ectopic      Multiple      Live Births               Home Medications    Prior to Admission medications   Medication Sig Start Date End Date Taking? Authorizing Provider  amLODipine (NORVASC) 5 MG tablet Take 1 tablet (5 mg total) by mouth daily. 06/14/17  Yes Timmothy Euler, MD  ARTIFICIAL TEAR  OP Apply to eye.   Yes [provider]  aspirin EC 81 MG tablet Take 81 mg by mouth daily.   Yes [provider]  Calcium Carb-Cholecalciferol (CALCIUM + D3) 600-200 MG-UNIT TABS Take 1 tablet by mouth daily.   Yes [provider]  hydrochlorothiazide (MICROZIDE) 12.5 MG capsule TAKE 1 CAPSULE BY MOUTH EVERY DAY 01/17/18  Yes Gottschalk, Ashly M, DO  ibuprofen (ADVIL,MOTRIN) 200 MG tablet Take 400 mg by mouth every 6 (six) hours as needed for headache or moderate pain.   Yes [provider]  Multiple Vitamin (MULTIVITAMIN WITH MINERALS) TABS tablet Take 1 tablet by mouth daily.   Yes [provider]  docusate sodium (COLACE) 100 MG capsule Take 100 mg by mouth daily as needed for mild constipation.     [provider]    Family History Family  History  Problem Relation Age of Onset  . Coronary artery disease Mother         CABG age 28  . Hyperlipidemia Mother   . Stroke Mother   . Alcohol abuse Father   . Hypertension Father   . Hypertension Sister   . Hypertension Brother   . Arthritis Brother   . Anesthesia problems Neg Hx   . Hypotension Neg Hx   . Malignant hyperthermia Neg Hx   . Pseudochol deficiency Neg Hx     Social History Social History   Tobacco Use  . Smoking status: Never Smoker  . Smokeless tobacco: Never Used  Substance Use Topics  . Alcohol use: No  . Drug use: No     Allergies   Patient has no known allergies.   Review of Systems Review of Systems  Constitutional: Negative for appetite change and fatigue.  HENT: Negative for congestion, ear discharge and sinus pressure.   Eyes: Negative for discharge.  Respiratory: Negative for cough.   Cardiovascular: Positive for chest pain.  Gastrointestinal: Negative for abdominal pain and diarrhea.  Genitourinary: Negative for frequency and hematuria.  Musculoskeletal: Negative for back pain.  Skin: Negative for rash.  Neurological: Negative for seizures and headaches.  Psychiatric/Behavioral: Negative for hallucinations.     Physical Exam Updated Vital Signs BP (!) 145/87   Pulse 79   Temp 97.7 F (36.5 C) (Oral)   Resp 14   Ht 5\' 4"  (1.626 m)   Wt 70.8 kg   SpO2 100%   BMI 26.78 kg/m   Physical Exam  Constitutional: She is oriented to person, place, and time. She appears well-developed.  HENT:  Head: Normocephalic.  Eyes: Conjunctivae and EOM are normal. No scleral icterus.  Neck: Neck supple. No thyromegaly present.  Cardiovascular: Normal rate and regular rhythm. Exam reveals no gallop and no friction rub.  No murmur heard. Pulmonary/Chest: No stridor. She has no wheezes. She has no rales. She exhibits no tenderness.  Abdominal: She exhibits no distension. There is no tenderness. There is no rebound.  Musculoskeletal: Normal  range of motion. She exhibits no edema.  Lymphadenopathy:    She has no cervical adenopathy.  Neurological: She is oriented to person, place, and time. She exhibits normal muscle tone. Coordination normal.  Skin: No rash noted. No erythema.  Psychiatric: She has a normal mood and affect. Her behavior is normal.     ED Treatments / Results  Labs (all labs ordered are listed, but only abnormal results are displayed) Labs Reviewed  BASIC METABOLIC PANEL - Abnormal; Notable for the following components:  Result Value   Glucose, Bld 109 (*)    All other components within normal limits  CBC - Abnormal; Notable for the following components:   WBC 11.9 (*)    All other components within normal limits  I-STAT TROPONIN, ED  I-STAT TROPONIN, ED    EKG EKG Interpretation  Date/Time:  Saturday March 22 2018 11:08:52 EST Ventricular Rate:  89 PR Interval:    QRS Duration: 102 QT Interval:  369 QTC Calculation: 449 R Axis:   38 Text Interpretation:  Sinus rhythm Confirmed by Milton Ferguson [phone]) on 03/22/2018 11:29:53 AM   Radiology Dg Chest 2 View  Result Date: 03/22/2018 CLINICAL DATA:  Chest tightness, nausea, weakness and shortness of breath today. EXAM: CHEST - 2 VIEW COMPARISON:  PA and lateral chest 11/10/2009. FINDINGS: Eventration of the right hemidiaphragm is noted. Lungs are clear. Heart size is normal. No pneumothorax or pleural effusion. Aortic atherosclerosis is seen. No acute or focal bony abnormality. IMPRESSION: No acute disease. Electronically Signed   By: Inge Rise M.D.   On: 03/22/2018 12:16    Procedures Procedures (including critical care time)  Medications Ordered in ED Medications  acetaminophen (TYLENOL) tablet 650 mg (650 mg Oral Given 03/22/18 1423)     Initial Impression / Assessment and Plan / ED Course  I have reviewed the triage vital signs and the nursing notes.  Pertinent labs & imaging results that were available during my  care of the patient were reviewed by me and considered in my medical decision making (see chart for details).     Patient with chest pain troponin x2 is unremarkable EKG shows no acute changes.  Patient will be discharged home but referred to cardiology for possible stress test.  Final Clinical Impressions(s) / ED Diagnoses   Final diagnoses:  Atypical chest pain    ED Discharge Orders    None       Milton Ferguson, MD 03/22/18 1534    Milton Ferguson, MD 03/23/18 [phone]

## 2018-03-22 NOTE — ED Triage Notes (Signed)
Patient reports chest tightness with nausea, weakness, and SOB with exertion.

## 2018-03-22 NOTE — ED Notes (Signed)
To rad 

## 2018-03-22 NOTE — ED Notes (Signed)
Crackers provided as well as pos

## 2018-03-22 NOTE — ED Notes (Signed)
Pt complaint of headache  Dr Earnest Conroy informed   orders recieved

## 2018-03-22 NOTE — ED Notes (Signed)
Awaiting re-eval and dispo 

## 2018-03-22 NOTE — ED Notes (Signed)
I stat trop result 0.01

## 2018-03-22 NOTE — Progress Notes (Signed)
   Subjective:    Patient ID: Ashley Holland, female    DOB: 1941-01-27, 77 y.o.   MRN: [id number]   Chief Complaint: heaviness in legs (cancer patient  breast) and chest pain this morning   HPI Patient said she woke up this morning and felt nauseous and legs felt rubbery. She says something just doesn't feel right. She layed back down and started feeling chest pain. She had followup with oncology thius Tuesday and he told her she was anemic. But according to labs results her hgb was 12.4. Described chest pain as heavy feeling that last over 30 minutes.   Review of Systems  Constitutional: Negative.   HENT: Negative.   Respiratory: Negative for shortness of breath.   Cardiovascular: Positive for chest pain. Negative for leg swelling.  Gastrointestinal: Negative.   Genitourinary: Negative.   Musculoskeletal: Negative.   Neurological: Negative for dizziness, weakness and light-headedness.  Psychiatric/Behavioral: Negative.   All other systems reviewed and are negative.      Objective:   Physical Exam  Constitutional: She is oriented to person, place, and time. She appears well-developed and well-nourished. No distress.  Cardiovascular: Normal rate and regular rhythm.  Pulmonary/Chest: Effort normal and breath sounds normal.  Abdominal: Soft.  Neurological: She is alert and oriented to person, place, and time.  Skin: Skin is warm.  Psychiatric: She has a normal mood and affect. Her behavior is normal. Thought content normal.   BP 131/81   Pulse (!) 105   Temp (!) 97 F (36.1 C)   Ht 5\' 4"  (1.626 m)   Wt 156 lb (70.8 kg)   BMI 26.78 kg/m         Assessment & Plan:  Ashley Holland in today with chief complaint of heaviness in legs (cancer patient  breast) and chest pain this morning   1. Chest pain at rest Patient decided to go to er for mor extensive workup since I was not able to do troponin levels here.  Her husband is taking her to [facility]- patient stable and  did not need EMS>  Mary-Margaret Hassell Done, FNP

## 2018-03-22 NOTE — ED Notes (Signed)
From Rad 

## 2018-03-22 NOTE — Discharge Instructions (Addendum)
Follow-up with Dr. Bronson Ing or 1 of his partners next week.  Return sooner if problems

## 2018-03-26 NOTE — Progress Notes (Signed)
Cardiology Office Note   Date:  03/28/2018   ID:  Ashley Holland, DOB 07-09-1940, MRN [id number]  PCP:  [name], DO  Cardiologist:   No primary care provider on file. Referring:  [name], DO  Chief Complaint  Patient presents with  . Chest Pain      History of Present Illness: Ashley Holland is a 77 y.o. female who presents for evaluation of chest pain.  The patient was in the ED with this in late Nov.  I reviewed these records for this appt.   she woke up that day on November 30 and just "felt weird".  She had some right-sided discomfort.  It was a chest pressure.  It was not associated with nausea vomiting or diaphoresis.  It went away spontaneously.  EKG was unremarkable.  Labs were unremarkable.  Since that time she has not had any further discomfort.  She does walk 7 blocks every day and she notices that when she gets the last 3 blocks she is more short of breath although it is not been incline.  This is been going on for some time.  She is not describing resting shortness of breath, PND or orthopnea.  She is not having any palpitations, presyncope or syncope.  I saw her in 2012.  She had a negative POET (Plain Old Exercise Treadmill) to evaluate chest pain.       Past Medical History:  Diagnosis Date  . Arthritis    Per medical history form dated 11/04/09.past history DJD back  . Enlarged thyroid    "was told had nodules" - no swallowing problems  . Female bladder prolapse   . GERD (gastroesophageal reflux disease) 05/19/2013  . Malignant neoplasm of breast (female), unspecified site    right breast cancer. surgery, radiation, oral meds- Arimidex.  . Nontoxic multinodular goiter 11/10/2015  . Other and unspecified hyperlipidemia   . Rectal prolapse    stool softeners helpful.  [name] Unspecified essential hypertension     Past Surgical History:  Procedure Laterality Date  . ABDOMINAL HYSTERECTOMY    . BREAST LUMPECTOMY  2010   Right  .  CHOLECYSTECTOMY    . COLONOSCOPY    . EUS  04/25/2011   Procedure: UPPER ENDOSCOPIC ULTRASOUND (EUS) RADIAL;  Surgeon: Landry Dyke, MD;  Location: [facility];  Service: Endoscopy;  Laterality: N/A;  . TOTAL HIP ARTHROPLASTY Right 07/26/2014   Procedure: RIGHT TOTAL HIP ARTHROPLASTY ANTERIOR APPROACH;  Surgeon: Gaynelle Arabian, MD;  Location: [facility];  Service: Orthopedics;  Laterality: Right;  . UPPER GASTROINTESTINAL ENDOSCOPY  Sept 2012     Current Outpatient Medications  Medication Sig Dispense Refill  . amLODipine (NORVASC) 5 MG tablet Take 1 tablet (5 mg total) by mouth daily. 90 tablet 3  . ARTIFICIAL TEAR OP Apply to eye.    [name] aspirin EC 81 MG tablet Take 81 mg by mouth daily.    . Calcium Carb-Cholecalciferol (CALCIUM + D3) 600-200 MG-UNIT TABS Take 1 tablet by mouth daily.    [name] docusate sodium (COLACE) 100 MG capsule Take 100 mg by mouth daily as needed for mild constipation.     . hydrochlorothiazide (MICROZIDE) 12.5 MG capsule TAKE 1 CAPSULE BY MOUTH EVERY DAY 90 capsule 1  . ibuprofen (ADVIL,MOTRIN) 200 MG tablet Take 400 mg by mouth every 6 (six) hours as needed for headache or moderate pain.    . Multiple Vitamin (MULTIVITAMIN WITH MINERALS) TABS tablet Take 1 tablet by  mouth daily.     No current facility-administered medications for this visit.     Allergies:   Patient has no known allergies.    Social History:  The patient  reports that she has never smoked. She has never used smokeless tobacco. She reports that she does not drink alcohol or use drugs.   Family History:  The patient's family history includes Alcohol abuse in her father; Arthritis in her brother; Coronary artery disease (age of onset: 35) in her mother; Hyperlipidemia in her mother; Hypertension in her brother, father, and sister; Stroke in her mother.    ROS:  Please see the history of present illness.   Otherwise, review of systems are positive for none.   All other systems are reviewed and negative.     PHYSICAL EXAM: VS:  BP (!) 161/96 (BP Location: Left Arm, Patient Position: Sitting, Cuff Size: Normal)   Pulse 63   Ht 5' 4.5" (1.638 m)   Wt 157 lb 9.6 oz (71.5 kg)   BMI 26.63 kg/m  , BMI Body mass index is 26.63 kg/m. GENERAL:  Well appearing HEENT:  Pupils equal round and reactive, fundi not visualized, oral mucosa unremarkable NECK:  No jugular venous distention, waveform within normal limits, carotid upstroke brisk and symmetric, no bruits, no thyromegaly LYMPHATICS:  No cervical, inguinal adenopathy LUNGS:  Clear to auscultation bilaterally BACK:  No CVA tenderness CHEST:  Unremarkable HEART:  PMI not displaced or sustained,S1 and S2 within normal limits, no S3, no S4, no clicks, no rubs, no murmurs ABD:  Flat, positive bowel sounds normal in frequency in pitch, no bruits, no rebound, no guarding, no midline pulsatile mass, no hepatomegaly, no splenomegaly EXT:  2 plus pulses throughout, no edema, no cyanosis no clubbing SKIN:  No rashes no nodules NEURO:  Cranial nerves II through XII grossly intact, motor grossly intact throughout PSYCH:  Cognitively intact, oriented to person place and time   EKG:  EKG is not ordered today. The ekg ordered 03/22/18 demonstrates sinus rhythm, rate 89, axis within normal limits, intervals within normal limits, no acute ST-T wave changes.   Recent Labs: 06/14/2017: ALT 13; TSH 0.749 03/22/2018: BUN 14; Creatinine, Ser 0.68; Hemoglobin 12.6; Platelets 346; Potassium 3.8; Sodium 137    Lipid Panel    Component Value Date/Time   CHOL 191 06/14/2017 1055   CHOL 132 10/16/2012 0905   TRIG 56 06/14/2017 1055   TRIG 89 03/17/2013 0852   TRIG 54 10/16/2012 0905   HDL 69 06/14/2017 1055   HDL 69 03/17/2013 0852   HDL 50 10/16/2012 0905   CHOLHDL 2.8 06/14/2017 1055   LDLCALC 111 (H) 06/14/2017 1055   LDLCALC 81 03/17/2013 0852   LDLCALC 71 10/16/2012 0905      Wt Readings from Last 3 Encounters:  03/28/18 157 lb 9.6 oz (71.5 kg)   03/22/18 156 lb (70.8 kg)  03/22/18 156 lb (70.8 kg)      Other studies Reviewed: Additional studies/ records that were reviewed today include: ED records. Review of the above records demonstrates:  Please see elsewhere in the note.     ASSESSMENT AND PLAN:  Chest pain:   Her pain is atypical but she does have some dyspnea with exertion and a significant family history. I will bring the patient back for a POET (Plain Old Exercise Test). This will allow me to screen for obstructive coronary disease, risk stratify and very importantly provide a prescription for exercise.  HTN: Her blood pressure is  usually very well controlled at home and she says 130s over 70s.  Therefore, I will not make any change but she will keep an eye on this.  Dyslipidemia: Her lipids are excellent with a normal ratio.  No change in therapy.   Current medicines are reviewed at length with the patient today.  The patient does not have concerns regarding medicines.  The following changes have been made:  no change  Labs/ tests ordered today include:   Orders Placed This Encounter  Procedures  . EXERCISE TOLERANCE TEST (ETT)     Disposition:   FU with me as needed.      Signed, Minus Breeding, MD  03/28/2018 12:03 PM    [place]

## 2018-03-28 ENCOUNTER — Encounter: Payer: Self-pay | Admitting: Cardiology

## 2018-03-28 ENCOUNTER — Ambulatory Visit (INDEPENDENT_AMBULATORY_CARE_PROVIDER_SITE_OTHER): Payer: Medicare Other | Admitting: Cardiology

## 2018-03-28 VITALS — BP 161/96 | HR 63 | Ht 64.5 in | Wt 157.6 lb

## 2018-03-28 DIAGNOSIS — I10 Essential (primary) hypertension: Secondary | ICD-10-CM | POA: Diagnosis not present

## 2018-03-28 DIAGNOSIS — E785 Hyperlipidemia, unspecified: Secondary | ICD-10-CM | POA: Diagnosis not present

## 2018-03-28 DIAGNOSIS — R079 Chest pain, unspecified: Secondary | ICD-10-CM | POA: Diagnosis not present

## 2018-03-28 NOTE — Patient Instructions (Signed)
Medication Instructions:  Continue current medications  If you need a refill on your cardiac medications before your next appointment, please call your pharmacy.  Labwork: None Ordered   If you have labs (blood work) drawn today and your tests are completely normal, you will receive your results only by: [name] MyChart Message (if you have MyChart) OR . A paper copy in the mail If you have any lab test that is abnormal or we need to change your treatment, we will call you to review the results.  Testing/Procedures: Your physician has requested that you have an exercise tolerance test. For further information please visit [email or web address]. Please also follow instruction sheet, as given.   Follow-Up: . You will need a follow up appointment in As Needed.   At [facility], you and your health needs are our priority.  As part of our continuing mission to provide you with exceptional heart care, we have created designated Provider Care Teams.  These Care Teams include your primary Cardiologist (physician) and Advanced Practice Providers (APPs -  Physician Assistants and Nurse Practitioners) who all work together to provide you with the care you need, when you need it.   Thank you for choosing [facility] at [facility]!!

## 2018-04-01 DIAGNOSIS — D509 Iron deficiency anemia, unspecified: Secondary | ICD-10-CM | POA: Diagnosis not present

## 2018-04-02 ENCOUNTER — Ambulatory Visit: Payer: Medicare Other | Admitting: Family Medicine

## 2018-04-25 ENCOUNTER — Telehealth (HOSPITAL_COMMUNITY): Payer: Self-pay

## 2018-04-25 NOTE — Telephone Encounter (Signed)
Encounter complete. 

## 2018-04-29 ENCOUNTER — Ambulatory Visit (HOSPITAL_COMMUNITY)
Admission: RE | Admit: 2018-04-29 | Discharge: 2018-04-29 | Disposition: A | Discharge status: Home / Self Care | Payer: Medicare Other | Source: Ambulatory Visit | Attending: Cardiovascular Disease | Admitting: Cardiovascular Disease

## 2018-04-29 DIAGNOSIS — R079 Chest pain, unspecified: Secondary | ICD-10-CM

## 2018-04-29 LAB — EXERCISE TOLERANCE TEST
CHL RATE OF PERCEIVED EXERTION: 17
Estimated workload: 8 METS
Exercise duration (min): 6 min
Exercise duration (sec): 40 s
MPHR: 142 {beats}/min
Peak HR: 155 {beats}/min
Percent HR: 109 %
Rest HR: 75 {beats}/min

## 2018-05-15 DIAGNOSIS — Z86010 Personal history of colonic polyps: Secondary | ICD-10-CM | POA: Diagnosis not present

## 2018-05-15 DIAGNOSIS — K5140 Inflammatory polyps of colon without complications: Secondary | ICD-10-CM | POA: Diagnosis not present

## 2018-05-15 DIAGNOSIS — Z9049 Acquired absence of other specified parts of digestive tract: Secondary | ICD-10-CM | POA: Diagnosis not present

## 2018-05-15 DIAGNOSIS — K635 Polyp of colon: Secondary | ICD-10-CM | POA: Diagnosis not present

## 2018-05-15 DIAGNOSIS — D122 Benign neoplasm of ascending colon: Secondary | ICD-10-CM | POA: Diagnosis not present

## 2018-05-15 DIAGNOSIS — D500 Iron deficiency anemia secondary to blood loss (chronic): Secondary | ICD-10-CM | POA: Diagnosis not present

## 2018-05-15 DIAGNOSIS — K648 Other hemorrhoids: Secondary | ICD-10-CM | POA: Diagnosis not present

## 2018-05-15 DIAGNOSIS — D509 Iron deficiency anemia, unspecified: Secondary | ICD-10-CM | POA: Diagnosis not present

## 2018-05-15 DIAGNOSIS — K2970 Gastritis, unspecified, without bleeding: Secondary | ICD-10-CM | POA: Diagnosis not present

## 2018-06-09 ENCOUNTER — Encounter: Payer: Medicare Other | Admitting: *Deleted

## 2018-06-09 ENCOUNTER — Other Ambulatory Visit: Payer: Self-pay | Admitting: *Deleted

## 2018-06-09 MED ORDER — AMLODIPINE BESYLATE 5 MG PO TABS: 5.0000 mg | ORAL_TABLET | Freq: Every day | ORAL | 0 refills | Status: DC

## 2018-06-11 ENCOUNTER — Ambulatory Visit: Payer: Medicare Other | Admitting: Family Medicine

## 2018-06-18 ENCOUNTER — Encounter: Payer: Self-pay | Admitting: Family Medicine

## 2018-06-18 ENCOUNTER — Ambulatory Visit (INDEPENDENT_AMBULATORY_CARE_PROVIDER_SITE_OTHER): Payer: Medicare Other | Admitting: Family Medicine

## 2018-06-18 VITALS — BP 133/80 | HR 72 | Temp 97.9°F | Ht 64.0 in | Wt 158.0 lb

## 2018-06-18 DIAGNOSIS — D508 Other iron deficiency anemias: Secondary | ICD-10-CM

## 2018-06-18 DIAGNOSIS — E063 Autoimmune thyroiditis: Secondary | ICD-10-CM

## 2018-06-18 DIAGNOSIS — I10 Essential (primary) hypertension: Secondary | ICD-10-CM | POA: Diagnosis not present

## 2018-06-18 LAB — HEMOGLOBIN, FINGERSTICK: Hemoglobin: 13.7 g/dL (ref 11.1–15.9)

## 2018-06-18 MED ORDER — HYDROCHLOROTHIAZIDE 12.5 MG PO CAPS: ORAL_CAPSULE | ORAL | 3 refills | Status: DC

## 2018-06-18 MED ORDER — AMLODIPINE BESYLATE 5 MG PO TABS: 5.0000 mg | ORAL_TABLET | Freq: Every day | ORAL | 3 refills | Status: DC

## 2018-06-18 NOTE — Progress Notes (Signed)
Subjective: CC: f/u chronic issues PCP: [name], DO NIO:[id number] Ashley Holland is a 78 y.o. female presenting to clinic today for:  1.  Hypertension  Patient reports compliance with hydrochlorothiazide 12.5 mg and Norvasc 5 mg daily.  She denies any chest pain, shortness of breath.  She has had some exercise tolerance changes as below but this is been worked up.  She was actually evaluated in November for atypical chest pain in the emergency department and had negative troponins.  Her subsequent stress test outpatient with Dr. Percival Spanish was also considered negative.  She brings in her blood pressure log and systolic blood pressures look to have been running 1 20-1 93G over 18E diastolic.  2. Anemia Patient is that she saw her oncologist in November and was noted to be "3 pints down" she was referred to her gastroenterologist, Dr. Earlean Shawl who performed an EGD and colonoscopy.  She had a benign hyperplastic polyp within the colonoscopy.  She was placed on Prilosec 40 mg daily and has been doing well on this.  She was also told to take oral iron supplementation has been doing so.  She would like to get her hemoglobin rechecked.  Denies any fatigue, pallor or lightheadedness.  She continues to have some decreased exercise tolerance but continues to walk her dog regularly and go to yoga weekly.  She has been evaluated by cardiology for this and had a negative stress test.  3.  Autoimmune thyroiditis Patient with history of autoimmune thyroiditis.  She has been off of medication for a while now, citing that it had resolved itself and she no longer needed medicine.  No tremor, insomnia or increased anxiety.  No heart palpitations.  ROS: Per HPI  No Known Allergies Past Medical History:  Diagnosis Date  . Arthritis    Per medical history form dated 11/04/09.past history DJD back  . Enlarged thyroid    "was told had nodules" - no swallowing problems  . Female bladder prolapse   . GERD  (gastroesophageal reflux disease) 05/19/2013  . Malignant neoplasm of breast (female), unspecified site    right breast cancer. surgery, radiation, oral meds- Arimidex.  . Nontoxic multinodular goiter 11/10/2015  . Other and unspecified hyperlipidemia   . Rectal prolapse    stool softeners helpful.  [name] Unspecified essential hypertension     Current Outpatient Medications:  .  amLODipine (NORVASC) 5 MG tablet, Take 1 tablet (5 mg total) by mouth daily., Disp: 90 tablet, Rfl: 0 .  ARTIFICIAL TEAR OP, Apply to eye., Disp: , Rfl:  .  aspirin EC 81 MG tablet, Take 81 mg by mouth daily., Disp: , Rfl:  .  Calcium Carb-Cholecalciferol (CALCIUM + D3) 600-200 MG-UNIT TABS, Take 1 tablet by mouth daily., Disp: , Rfl:  .  docusate sodium (COLACE) 100 MG capsule, Take 100 mg by mouth daily as needed for mild constipation. , Disp: , Rfl:  .  hydrochlorothiazide (MICROZIDE) 12.5 MG capsule, TAKE 1 CAPSULE BY MOUTH EVERY DAY, Disp: 90 capsule, Rfl: 1 .  ibuprofen (ADVIL,MOTRIN) 200 MG tablet, Take 400 mg by mouth every 6 (six) hours as needed for headache or moderate pain., Disp: , Rfl:  .  Multiple Vitamin (MULTIVITAMIN WITH MINERALS) TABS tablet, Take 1 tablet by mouth daily., Disp: , Rfl:  .  omeprazole (PRILOSEC) 40 MG capsule, Take by mouth., Disp: , Rfl:  Social History   Socioeconomic History  . Marital status: Married    Spouse name: Not on file  .  Number of children: 2  . Years of education: Not on file  . Highest education level: Not on file  Occupational History  . Occupation: Retired    Comment: Control and instrumentation engineer  Social Needs  . Financial resource strain: Not hard at all  . Food insecurity:    Worry: Never true    Inability: Never true  . Transportation needs:    Medical: No    Non-medical: No  Tobacco Use  . Smoking status: Never Smoker  . Smokeless tobacco: Never Used  Substance and Sexual Activity  . Alcohol use: No  . Drug use: No  . Sexual activity: Not Currently    Lifestyle  . Physical activity:    Days per week: 7 days    Minutes per session: 60 min  . Stress: Not at all  Relationships  . Social connections:    Talks on phone: More than three times a week    Gets together: More than three times a week    Attends religious service: More than 4 times per year    Active member of club or organization: Yes    Attends meetings of clubs or organizations: More than 4 times per year    Relationship status: Married  . Intimate partner violence:    Fear of current or ex partner: No    Emotionally abused: No    Physically abused: No    Forced sexual activity: No  Other Topics Concern  . Not on file  Social History Narrative  . Not on file   Family History  Problem Relation Age of Onset  . Coronary artery disease Mother 52        CABG age 85  . Hyperlipidemia Mother   . Stroke Mother   . Alcohol abuse Father   . Hypertension Father   . Hypertension Sister   . Hypertension Brother   . Arthritis Brother   . Anesthesia problems Neg Hx   . Hypotension Neg Hx   . Malignant hyperthermia Neg Hx   . Pseudochol deficiency Neg Hx     Objective: Office vital signs reviewed. BP 133/80   Pulse 72   Temp 97.9 F (36.6 C) (Oral)   Ht 5\' 4"  (1.626 m)   Wt 158 lb (71.7 kg)   BMI 27.12 kg/m   Physical Examination:  General: Awake, alert, well nourished, No acute distress HEENT: Normal, sclera white, MMM.  No exophthalmos.  No conjunctival pallor Cardio: regular rate and rhythm, S1S2 heard, no murmurs appreciated Pulm: clear to auscultation bilaterally, no wheezes, rhonchi or rales; normal work of breathing on room air Neuro: no tremor  Assessment/ Plan: 78 y.o. female   1. Essential hypertension Under great control.  Continue current regimen.  Refills have been sent.  Check BMP - amLODipine (NORVASC) 5 MG tablet; Take 1 tablet (5 mg total) by mouth daily.  Dispense: 90 tablet; Refill: 3 - hydrochlorothiazide (MICROZIDE) 12.5 MG capsule;  TAKE 1 CAPSULE BY MOUTH EVERY DAY  Dispense: 90 capsule; Refill: 3 - Basic Metabolic Panel  2. Iron deficiency anemia secondary to inadequate dietary iron intake Has had some exercise tolerance changes but continues to be fairly active.  We will check her hemoglobin level.  It appears that her last check was around 11.2 with an outside provider.  Ferritin was noted to be markedly low and we are rechecking this as well.  Continue supplementation.  Continue follow-up with GI as directed. - Ferritin - Hemoglobin, fingerstick  3. Autoimmune thyroiditis  Asymptomatic.  Check thyroid panel - Thyroid Panel With TSH   Orders Placed This Encounter  Procedures  . Ferritin  . Thyroid Panel With TSH  . Hemoglobin, fingerstick   Meds ordered this encounter  Medications  . amLODipine (NORVASC) 5 MG tablet    Sig: Take 1 tablet (5 mg total) by mouth daily.    Dispense:  90 tablet    Refill:  3  . hydrochlorothiazide (MICROZIDE) 12.5 MG capsule    Sig: TAKE 1 CAPSULE BY MOUTH EVERY DAY    Dispense:  90 capsule    Refill:  [place], DO [place] [phone]

## 2018-06-18 NOTE — Patient Instructions (Signed)
You had labs performed today.  You will be contacted with the results of the labs once they are available, usually in the next 3 business days for routine lab work.  If you had a pap smear or biopsy performed, expect to be contacted in about 7-10 days.  

## 2018-06-19 LAB — SPECIMEN STATUS

## 2018-06-19 LAB — BASIC METABOLIC PANEL
BUN/Creatinine Ratio: 19 (ref 12–28)
BUN: 15 mg/dL (ref 8–27)
CO2: 23 mmol/L (ref 20–29)
Calcium: 9.4 mg/dL (ref 8.7–10.3)
Chloride: 101 mmol/L (ref 96–106)
Creatinine, Ser: 0.8 mg/dL (ref 0.57–1.00)
GFR calc Af Amer: 82 mL/min/{1.73_m2} (ref >59)
GFR calc non Af Amer: 71 mL/min/{1.73_m2} (ref >59)
Glucose: 83 mg/dL (ref 65–99)
Potassium: 3.9 mmol/L (ref 3.5–5.2)
SODIUM: 141 mmol/L (ref 134–144)

## 2018-06-19 LAB — THYROID PANEL WITH TSH
Free Thyroxine Index: 2 (ref 1.2–4.9)
T3 Uptake Ratio: 24 % (ref 24–39)
T4, Total: 8.4 ug/dL (ref 4.5–12.0)
TSH: 0.788 u[IU]/mL (ref 0.450–4.500)

## 2018-06-19 LAB — FERRITIN: FERRITIN: 27 ng/mL (ref 15–150)

## 2018-07-22 ENCOUNTER — Ambulatory Visit: Payer: Medicare Other | Admitting: Family Medicine

## 2018-09-01 ENCOUNTER — Other Ambulatory Visit: Payer: Self-pay

## 2018-09-01 ENCOUNTER — Ambulatory Visit (INDEPENDENT_AMBULATORY_CARE_PROVIDER_SITE_OTHER): Payer: Medicare Other | Admitting: *Deleted

## 2018-09-01 DIAGNOSIS — Z23 Encounter for immunization: Secondary | ICD-10-CM

## 2018-09-01 NOTE — Progress Notes (Signed)
Pt injured hand this AM on old recliner Requesting Tdap Boostrix given Pt tolerated well

## 2018-09-29 ENCOUNTER — Ambulatory Visit (INDEPENDENT_AMBULATORY_CARE_PROVIDER_SITE_OTHER): Payer: Medicare Other | Admitting: Family Medicine

## 2018-09-29 ENCOUNTER — Encounter: Payer: Self-pay | Admitting: Family Medicine

## 2018-09-29 ENCOUNTER — Other Ambulatory Visit: Payer: Self-pay

## 2018-09-29 VITALS — BP 151/89 | HR 69 | Temp 97.8°F | Ht 64.0 in | Wt 158.8 lb

## 2018-09-29 DIAGNOSIS — R071 Chest pain on breathing: Secondary | ICD-10-CM

## 2018-09-29 DIAGNOSIS — R0789 Other chest pain: Secondary | ICD-10-CM

## 2018-09-29 MED ORDER — IBUPROFEN 200 MG PO TABS: 200.0000 mg | ORAL_TABLET | Freq: Three times a day (TID) | ORAL | 0 refills | Status: AC

## 2018-09-29 NOTE — Patient Instructions (Signed)
Costochondritis    Costochondritis is swelling and irritation (inflammation) of the tissue (cartilage) that connects your ribs to your breastbone (sternum). This causes pain in the front of your chest. The pain usually starts gradually and involves more than one rib.  What are the causes?  The exact cause of this condition is not always known. It results from stress on the cartilage where your ribs attach to your sternum. The cause of this stress could be:   Chest injury (trauma).   Exercise or activity, such as lifting.   Severe coughing.  What increases the risk?  You may be at higher risk for this condition if you:   Are female.   Are 30?79 years old.   Recently started a new exercise or work activity.   Have low levels of vitamin D.   Have a condition that makes you cough frequently.  What are the signs or symptoms?  The main symptom of this condition is chest pain. The pain:   Usually starts gradually and can be sharp or dull.   Gets worse with deep breathing, coughing, or exercise.   Gets better with rest.   May be worse when you press on the sternum-rib connection (tenderness).  How is this diagnosed?  This condition is diagnosed based on your symptoms, medical history, and a physical exam. Your health care provider will check for tenderness when pressing on your sternum. This is the most important finding. You may also have tests to rule out other causes of chest pain. These may include:   A chest X-ray to check for lung problems.   An electrocardiogram (ECG) to see if you have a heart problem that could be causing the pain.   An imaging scan to rule out a chest or rib fracture.  How is this treated?  This condition usually goes away on its own over time. Your health care provider may prescribe an NSAID to reduce pain and inflammation. Your health care provider may also suggest that you:   Rest and avoid activities that make pain worse.   Apply heat or cold to the area to reduce pain and  inflammation.   Do exercises to stretch your chest muscles.  If these treatments do not help, your health care provider may inject a numbing medicine at the sternum-rib connection to help relieve the pain.  Follow these instructions at home:   Avoid activities that make pain worse. This includes any activities that use chest, abdominal, and side muscles.   If directed, put ice on the painful area:  ? Put ice in a plastic bag.  ? Place a towel between your skin and the bag.  ? Leave the ice on for 20 minutes, 2-3 times a day.   If directed, apply heat to the affected area as often as told by your health care provider. Use the heat source that your health care provider recommends, such as a moist heat pack or a heating pad.  ? Place a towel between your skin and the heat source.  ? Leave the heat on for 20-30 minutes.  ? Remove the heat if your skin turns bright red. This is especially important if you are unable to feel pain, heat, or cold. You may have a greater risk of getting burned.   Take over-the-counter and prescription medicines only as told by your health care provider.   Return to your normal activities as told by your health care provider. Ask your health care provider what   activities are safe for you.   Keep all follow-up visits as told by your health care provider. This is important.  Contact a health care provider if:   You have chills or a fever.   Your pain does not go away or it gets worse.   You have a cough that does not go away (is persistent).  Get help right away if:   You have shortness of breath.  This information is not intended to replace advice given to you by your health care provider. Make sure you discuss any questions you have with your health care provider.  Document Released: 01/17/2005 Document Revised: 01/09/2017 Document Reviewed: 08/03/2015  Elsevier Interactive Patient Education  2019 [employer]vier Inc[employer]

## 2018-09-29 NOTE — Progress Notes (Signed)
Subjective:  Patient ID: Ashley Holland, female    DOB: 11-04-1940, 78 y.o.   MRN: [id number]  Chief Complaint:  Flank Pain (Patient states when she gets up after sitting for awhile she gets a sharp pain left flank that will go under left breast that last x 5 mins. States has been going on x 3 days)   HPI: Ashley Holland is a 78 y.o. female presenting on 09/29/2018 for Flank Pain (Patient states when she gets up after sitting for awhile she gets a sharp pain left flank that will go under left breast that last x 5 mins. States has been going on x 3 days)  Pt presents today with complaints of left flank pain that radiates around under her left breast. She states this has been intermittent for the last 3-4 days. States the pain is sharp and only lasts a few seconds. She denies chest pain, palpitations, shortness of breath, weakness, syncope, dizziness, confusion, or epigastric pain. States she has been sneezing but no coughing. States she has not tried anything for the pain because it only lasts a brief second. She denies injury. States the pain only occurs when going from a sitting to standing position. States rest relieves the pain. At worst, the pain is 4/10.   Relevant past medical, surgical, family, and social history reviewed and updated as indicated.  Allergies and medications reviewed and updated.   Past Medical History:  Diagnosis Date  . Arthritis    Per medical history form dated 11/04/09.past history DJD back  . Enlarged thyroid    "was told had nodules" - no swallowing problems  . Female bladder prolapse   . GERD (gastroesophageal reflux disease) 05/19/2013  . Malignant neoplasm of breast (female), unspecified site    right breast cancer. surgery, radiation, oral meds- Arimidex.  . Nontoxic multinodular goiter 11/10/2015  . Other and unspecified hyperlipidemia   . Rectal prolapse    stool softeners helpful.  [name] Unspecified essential hypertension     Past Surgical History:   Procedure Laterality Date  . ABDOMINAL HYSTERECTOMY    . BREAST LUMPECTOMY  2010   Right  . CHOLECYSTECTOMY    . COLONOSCOPY    . EUS  04/25/2011   Procedure: UPPER ENDOSCOPIC ULTRASOUND (EUS) RADIAL;  Surgeon: Landry Dyke, MD;  Location: [facility] ENDOSCOPY;  Service: Endoscopy;  Laterality: N/A;  . TOTAL HIP ARTHROPLASTY Right 07/26/2014   Procedure: RIGHT TOTAL HIP ARTHROPLASTY ANTERIOR APPROACH;  Surgeon: Gaynelle Arabian, MD;  Location: [facility] ORS;  Service: Orthopedics;  Laterality: Right;  . UPPER GASTROINTESTINAL ENDOSCOPY  Sept 2012    Social History   Socioeconomic History  . Marital status: Married    Spouse name: Not on file  . Number of children: 2  . Years of education: Not on file  . Highest education level: Not on file  Occupational History  . Occupation: Retired    Comment: Control and instrumentation engineer  Social Needs  . Financial resource strain: Not hard at all  . Food insecurity:    Worry: Never true    Inability: Never true  . Transportation needs:    Medical: No    Non-medical: No  Tobacco Use  . Smoking status: Never Smoker  . Smokeless tobacco: Never Used  Substance and Sexual Activity  . Alcohol use: No  . Drug use: No  . Sexual activity: Not Currently  Lifestyle  . Physical activity:    Days per week: 7 days  Minutes per session: 60 min  . Stress: Not at all  Relationships  . Social connections:    Talks on phone: More than three times a week    Gets together: More than three times a week    Attends religious service: More than 4 times per year    Active member of club or organization: Yes    Attends meetings of clubs or organizations: More than 4 times per year    Relationship status: Married  . Intimate partner violence:    Fear of current or ex partner: No    Emotionally abused: No    Physically abused: No    Forced sexual activity: No  Other Topics Concern  . Not on file  Social History Narrative  . Not on file    Outpatient Encounter  Medications as of 09/29/2018  Medication Sig  . amLODipine (NORVASC) 5 MG tablet Take 1 tablet (5 mg total) by mouth daily.  . ARTIFICIAL TEAR OP Apply to eye.  [name] aspirin EC 81 MG tablet Take 81 mg by mouth daily.  . Calcium Carb-Cholecalciferol (CALCIUM + D3) 600-200 MG-UNIT TABS Take 1 tablet by mouth daily.  [name] docusate sodium (COLACE) 100 MG capsule Take 100 mg by mouth daily as needed for mild constipation.   . hydrochlorothiazide (MICROZIDE) 12.5 MG capsule TAKE 1 CAPSULE BY MOUTH EVERY DAY  . Multiple Vitamin (MULTIVITAMIN WITH MINERALS) TABS tablet Take 1 tablet by mouth daily.  . [DISCONTINUED] ibuprofen (ADVIL,MOTRIN) 200 MG tablet Take 400 mg by mouth every 6 (six) hours as needed for headache or moderate pain.  [name] ibuprofen (MOTRIN IB) 200 MG tablet Take 1 tablet (200 mg total) by mouth 3 (three) times daily for 14 days.   No facility-administered encounter medications on file as of 09/29/2018.     No Known Allergies  Review of Systems  Constitutional: Negative for activity change, appetite change, chills, diaphoresis, fatigue, fever and unexpected weight change.  Respiratory: Negative for cough, chest tightness and shortness of breath.   Cardiovascular: Negative for chest pain, palpitations and leg swelling.  Gastrointestinal: Negative for abdominal distention, abdominal pain, anal bleeding, blood in stool, constipation, diarrhea, nausea, rectal pain and vomiting.  Genitourinary: Positive for flank pain. Negative for decreased urine volume, difficulty urinating, dyspareunia, dysuria, enuresis, frequency, genital sores, hematuria, pelvic pain, urgency, vaginal bleeding, vaginal discharge and vaginal pain.  Musculoskeletal: Negative for arthralgias, joint swelling and myalgias.  Skin: Negative for color change and pallor.  Neurological: Negative for dizziness, tremors, seizures, syncope, facial asymmetry, weakness, light-headedness, numbness and headaches.  Psychiatric/Behavioral:  Negative for confusion.  All other systems reviewed and are negative.       Objective:  BP (!) 151/89   Pulse 69   Temp 97.8 F (36.6 C) (Oral)   Ht 5\' 4"  (1.626 m)   Wt 158 lb 12.8 oz (72 kg)   BMI 27.26 kg/m    Wt Readings from Last 3 Encounters:  09/29/18 158 lb 12.8 oz (72 kg)  06/18/18 158 lb (71.7 kg)  03/28/18 157 lb 9.6 oz (71.5 kg)    Physical Exam Vitals signs and nursing note reviewed.  Constitutional:      General: She is not in acute distress.    Appearance: Normal appearance. She is well-developed and well-groomed. She is not ill-appearing or toxic-appearing.  HENT:     Head: Normocephalic and atraumatic.     Right Ear: Tympanic membrane, ear canal and external ear normal.     Left Ear:  Tympanic membrane, ear canal and external ear normal.     Nose: Nose normal.     Mouth/Throat:     Mouth: Mucous membranes are moist.     Pharynx: Oropharynx is clear.  Eyes:     Conjunctiva/sclera: Conjunctivae normal.     Pupils: Pupils are equal, round, and reactive to light.  Neck:     Musculoskeletal: Normal range of motion and neck supple.     Vascular: No carotid bruit.  Cardiovascular:     Rate and Rhythm: Normal rate and regular rhythm.     Chest Wall: PMI is not displaced.     Pulses: Normal pulses.     Heart sounds: Normal heart sounds. No murmur. No friction rub. No gallop.   Pulmonary:     Effort: Pulmonary effort is normal. No respiratory distress.     Breath sounds: Normal breath sounds.  Chest:     Chest wall: No mass, lacerations, deformity, swelling, tenderness, crepitus or edema. There is no dullness to percussion.  Abdominal:     General: Bowel sounds are normal.     Palpations: Abdomen is soft.     Tenderness: There is no abdominal tenderness. There is no right CVA tenderness or left CVA tenderness.  Musculoskeletal:     Right lower leg: No edema.     Left lower leg: No edema.  Lymphadenopathy:     Upper Body:     Right upper body: No  supraclavicular, axillary or pectoral adenopathy.     Left upper body: No supraclavicular, axillary or pectoral adenopathy.  Skin:    General: Skin is warm and dry.     Capillary Refill: Capillary refill takes less than 2 seconds.     Findings: No lesion or rash.  Neurological:     General: No focal deficit present.     Mental Status: She is alert and oriented to person, place, and time.  Psychiatric:        Mood and Affect: Mood normal.        Behavior: Behavior normal. Behavior is cooperative.        Thought Content: Thought content normal.        Judgment: Judgment normal.     Results for orders placed or performed in visit on 06/18/18  Ferritin  Result Value Ref Range   Ferritin 27 15 - 150 ng/mL  Thyroid Panel With TSH  Result Value Ref Range   TSH 0.788 0.450 - 4.500 uIU/mL   T4, Total 8.4 4.5 - 12.0 ug/dL   T3 Uptake Ratio 24 24 - 39 %   Free Thyroxine Index 2.0 1.2 - 4.9  Hemoglobin, fingerstick  Result Value Ref Range   Hemoglobin 13.7 11.1 - 15.9 g/dL  Basic Metabolic Panel  Result Value Ref Range   Glucose 83 65 - 99 mg/dL   BUN 15 8 - 27 mg/dL   Creatinine, Ser 0.80 0.57 - 1.00 mg/dL   GFR calc non Af Amer 71 >59 mL/min/1.73   GFR calc Af Amer 82 >59 mL/min/1.73   BUN/Creatinine Ratio 19 12 - 28   Sodium 141 134 - 144 mmol/L   Potassium 3.9 3.5 - 5.2 mmol/L   Chloride 101 96 - 106 mmol/L   CO2 23 20 - 29 mmol/L   Calcium 9.4 8.7 - 10.3 mg/dL  Specimen Status  Result Value Ref Range   WBC WILL FOLLOW    RBC WILL FOLLOW    Hemoglobin WILL FOLLOW    Hematocrit WILL  FOLLOW    MCV WILL FOLLOW    MCH WILL FOLLOW    MCHC WILL FOLLOW    RDW WILL FOLLOW    Platelets WILL FOLLOW    Neutrophils WILL FOLLOW    Lymphs WILL FOLLOW    Monocytes WILL FOLLOW    Eos WILL FOLLOW    Basos WILL FOLLOW    Neutrophils Absolute WILL FOLLOW    Lymphocytes Absolute WILL FOLLOW    Monocytes Absolute WILL FOLLOW    EOS (ABSOLUTE) WILL FOLLOW    Basophils Absolute WILL  FOLLOW    Immature Granulocytes WILL FOLLOW    Immature Grans (Abs) WILL FOLLOW      EKG: SB without ectopy, ST elevation, or acute changes. Monia Pouch, FNP-C.  Pertinent labs & imaging results that were available during my care of the patient were reviewed by me and considered in my medical decision making.  Assessment & Plan:  [place]ette was seen today for flank pain.  Diagnoses and all orders for this visit:  Costochondral chest pain EKG SB without ectopy or acute changes. Signs and symptoms consistent with costochondritis. Symptomatic care discussed. Discussed the possibility of early shingles, no rash present. Pt aware to report any development of rash or lesions. Report any new or worsening symptoms. No red flags present. Pt aware of signs and symptoms that warrant emergent evaluation. Follow up in 2 weeks.  -     EKG 12-Lead -     ibuprofen (MOTRIN IB) 200 MG tablet; Take 1 tablet (200 mg total) by mouth 3 (three) times daily for 14 days.     Continue all other maintenance medications.  Follow up plan: Return in about 2 weeks (around 10/13/2018), or if symptoms worsen or fail to improve, for costochondritis .  Educational handout given for costochondritis  The above assessment and management plan was discussed with the patient. The patient verbalized understanding of and has agreed to the management plan. Patient is aware to call the clinic if symptoms persist or worsen. Patient is aware when to return to the clinic for a follow-up visit. Patient educated on when it is appropriate to go to the emergency department.   Monia Pouch, FNP-C [facility] [phone]

## 2018-10-13 ENCOUNTER — Other Ambulatory Visit: Payer: Self-pay

## 2018-10-14 ENCOUNTER — Ambulatory Visit: Payer: Medicare Other | Admitting: Family Medicine

## 2018-10-16 DIAGNOSIS — Z1231 Encounter for screening mammogram for malignant neoplasm of breast: Secondary | ICD-10-CM | POA: Diagnosis not present

## 2018-10-16 DIAGNOSIS — Z853 Personal history of malignant neoplasm of breast: Secondary | ICD-10-CM | POA: Diagnosis not present

## 2018-10-17 ENCOUNTER — Other Ambulatory Visit: Payer: Self-pay

## 2018-10-20 ENCOUNTER — Other Ambulatory Visit: Payer: Self-pay

## 2018-10-20 ENCOUNTER — Encounter: Payer: Self-pay | Admitting: Family Medicine

## 2018-10-20 ENCOUNTER — Ambulatory Visit (INDEPENDENT_AMBULATORY_CARE_PROVIDER_SITE_OTHER): Payer: Medicare Other | Admitting: Family Medicine

## 2018-10-20 VITALS — BP 145/77 | HR 64 | Temp 97.4°F | Ht 64.0 in | Wt 159.2 lb

## 2018-10-20 DIAGNOSIS — K219 Gastro-esophageal reflux disease without esophagitis: Secondary | ICD-10-CM | POA: Diagnosis not present

## 2018-10-20 DIAGNOSIS — R071 Chest pain on breathing: Secondary | ICD-10-CM

## 2018-10-20 DIAGNOSIS — R0789 Other chest pain: Secondary | ICD-10-CM

## 2018-10-20 NOTE — Patient Instructions (Signed)
Ok to stop Motrin and use only if needed.  If you do not see total resolution or if symptoms worsen, contact me.

## 2018-10-20 NOTE — Progress Notes (Signed)
Subjective: CC: f/u costochondritis PCP: [name], DO DXI:Ashley Holland is a 78 y.o. female presenting to clinic today for:  1.  Costochondritis Patient was seen 2 weeks ago for left-sided costochondral chest pain.  She had an EKG which was unremarkable for ischemic changes.  She was advised to use NSAID 3 times daily as needed pain.  Patient reports that she is using Motrin 3 times daily with meals as needed and notes that the symptoms are gotten quite a bit better but now she started developing a left upper quadrant burning sensation.  She notes history of GERD in the past and had discontinued her PPI after about 3 months since she was relatively well controlled.  She just had this refilled and plans to start this soon because she thinks she is now contused another gastritis.  No nausea, vomiting.  Denies any dysuria or hematuria.  Tolerating p.o. intake without difficulty.   ROS: Per HPI  No Known Allergies Past Medical History:  Diagnosis Date  . Arthritis    Per medical history form dated 11/04/09.past history DJD back  . Enlarged thyroid    "was told had nodules" - no swallowing problems  . Female bladder prolapse   . GERD (gastroesophageal reflux disease) 05/19/2013  . Malignant neoplasm of breast (female), unspecified site    right breast cancer. surgery, radiation, oral meds- Arimidex.  . Nontoxic multinodular goiter 11/10/2015  . Other and unspecified hyperlipidemia   . Rectal prolapse    stool softeners helpful.  [name] Unspecified essential hypertension     Current Outpatient Medications:  .  amLODipine (NORVASC) 5 MG tablet, Take 1 tablet (5 mg total) by mouth daily., Disp: 90 tablet, Rfl: 3 .  ARTIFICIAL TEAR OP, Apply to eye., Disp: , Rfl:  .  aspirin EC 81 MG tablet, Take 81 mg by mouth daily., Disp: , Rfl:  .  Calcium Carb-Cholecalciferol (CALCIUM + D3) 600-200 MG-UNIT TABS, Take 1 tablet by mouth daily., Disp: , Rfl:  .  docusate sodium (COLACE) 100 MG  capsule, Take 100 mg by mouth daily as needed for mild constipation. , Disp: , Rfl:  .  hydrochlorothiazide (MICROZIDE) 12.5 MG capsule, TAKE 1 CAPSULE BY MOUTH EVERY DAY, Disp: 90 capsule, Rfl: 3 .  Multiple Vitamin (MULTIVITAMIN WITH MINERALS) TABS tablet, Take 1 tablet by mouth daily., Disp: , Rfl:  Social History   Socioeconomic History  . Marital status: Married    Spouse name: Not on file  . Number of children: 2  . Years of education: Not on file  . Highest education level: Not on file  Occupational History  . Occupation: Retired    Comment: Control and instrumentation engineer  Social Needs  . Financial resource strain: Not hard at all  . Food insecurity    Worry: Never true    Inability: Never true  . Transportation needs    Medical: No    Non-medical: No  Tobacco Use  . Smoking status: Never Smoker  . Smokeless tobacco: Never Used  Substance and Sexual Activity  . Alcohol use: No  . Drug use: No  . Sexual activity: Not Currently  Lifestyle  . Physical activity    Days per week: 7 days    Minutes per session: 60 min  . Stress: Not at all  Relationships  . Social connections    Talks on phone: More than three times a week    Gets together: More than three times a week    Attends  religious service: More than 4 times per year    Active member of club or organization: Yes    Attends meetings of clubs or organizations: More than 4 times per year    Relationship status: Married  . Intimate partner violence    Fear of current or ex partner: No    Emotionally abused: No    Physically abused: No    Forced sexual activity: No  Other Topics Concern  . Not on file  Social History Narrative  . Not on file   Family History  Problem Relation Age of Onset  . Coronary artery disease Mother 59        CABG age 43  . Hyperlipidemia Mother   . Stroke Mother   . Alcohol abuse Father   . Hypertension Father   . Hypertension Sister   . Hypertension Brother   . Arthritis Brother   .  Anesthesia problems Neg Hx   . Hypotension Neg Hx   . Malignant hyperthermia Neg Hx   . Pseudochol deficiency Neg Hx     Objective: Office vital signs reviewed. BP (!) 145/77   Pulse 64   Temp (!) 97.4 F (36.3 C) (Oral)   Ht 5\' 4"  (1.626 m)   Wt 159 lb 3.2 oz (72.2 kg)   BMI 27.33 kg/m   Physical Examination:  General: Awake, alert, well nourished, No acute distress HEENT: Normal, sclera white Cardio: regular rate and rhythm, S1S2 heard, no murmurs appreciated Pulm: clear to auscultation bilaterally, no wheezes, rhonchi or rales; normal work of breathing on room air GI: soft, non-tender, non-distended, bowel sounds present x4, no hepatomegaly, no splenomegaly, no masses Extremities: warm, well perfused, No edema, cyanosis or clubbing; +2 pulses bilaterally MSK: Has full painless active range of motion of the bilateral upper extremities.  Assessment/ Plan: 78 y.o. female   1. Costochondral chest pain Okay to discontinue use of NSAIDs and symptoms have essentially resolved.  2. Gastroesophageal reflux disease without esophagitis Agree with use of PPI.  If symptoms do not resolve she is to contact me.   No orders of the defined types were placed in this encounter.  No orders of the defined types were placed in this encounter.    [name], DO [place] [phone]

## 2018-10-28 DIAGNOSIS — K2970 Gastritis, unspecified, without bleeding: Secondary | ICD-10-CM | POA: Diagnosis not present

## 2018-10-28 DIAGNOSIS — C50911 Malignant neoplasm of unspecified site of right female breast: Secondary | ICD-10-CM | POA: Diagnosis not present

## 2018-10-28 DIAGNOSIS — Z08 Encounter for follow-up examination after completed treatment for malignant neoplasm: Secondary | ICD-10-CM | POA: Diagnosis not present

## 2018-10-28 DIAGNOSIS — Z170 Estrogen receptor positive status [ER+]: Secondary | ICD-10-CM | POA: Diagnosis not present

## 2018-10-28 DIAGNOSIS — Z853 Personal history of malignant neoplasm of breast: Secondary | ICD-10-CM | POA: Diagnosis not present

## 2018-10-28 DIAGNOSIS — E611 Iron deficiency: Secondary | ICD-10-CM | POA: Diagnosis not present

## 2018-12-02 DIAGNOSIS — H01021 Squamous blepharitis right upper eyelid: Secondary | ICD-10-CM | POA: Diagnosis not present

## 2018-12-02 DIAGNOSIS — H0102B Squamous blepharitis left eye, upper and lower eyelids: Secondary | ICD-10-CM | POA: Diagnosis not present

## 2018-12-02 DIAGNOSIS — H01023 Squamous blepharitis right eye, unspecified eyelid: Secondary | ICD-10-CM | POA: Diagnosis not present

## 2018-12-17 ENCOUNTER — Ambulatory Visit: Payer: Medicare Other | Admitting: Family Medicine

## 2019-02-09 ENCOUNTER — Other Ambulatory Visit: Payer: Self-pay

## 2019-02-10 ENCOUNTER — Ambulatory Visit (INDEPENDENT_AMBULATORY_CARE_PROVIDER_SITE_OTHER): Payer: Medicare Other

## 2019-02-10 DIAGNOSIS — Z23 Encounter for immunization: Secondary | ICD-10-CM | POA: Diagnosis not present

## 2019-04-20 ENCOUNTER — Other Ambulatory Visit: Payer: Self-pay

## 2019-04-21 ENCOUNTER — Encounter: Payer: Self-pay | Admitting: Family Medicine

## 2019-04-21 ENCOUNTER — Ambulatory Visit (INDEPENDENT_AMBULATORY_CARE_PROVIDER_SITE_OTHER): Payer: Medicare Other | Admitting: Family Medicine

## 2019-04-21 VITALS — BP 138/83 | HR 66 | Temp 98.6°F | Ht 64.0 in | Wt 161.0 lb

## 2019-04-21 DIAGNOSIS — M545 Low back pain, unspecified: Secondary | ICD-10-CM

## 2019-04-21 DIAGNOSIS — E063 Autoimmune thyroiditis: Secondary | ICD-10-CM | POA: Diagnosis not present

## 2019-04-21 DIAGNOSIS — D508 Other iron deficiency anemias: Secondary | ICD-10-CM | POA: Diagnosis not present

## 2019-04-21 DIAGNOSIS — I10 Essential (primary) hypertension: Secondary | ICD-10-CM | POA: Diagnosis not present

## 2019-04-21 DIAGNOSIS — E7800 Pure hypercholesterolemia, unspecified: Secondary | ICD-10-CM | POA: Diagnosis not present

## 2019-04-21 DIAGNOSIS — R55 Syncope and collapse: Secondary | ICD-10-CM

## 2019-04-21 NOTE — Progress Notes (Signed)
Subjective: CC: Follow-up hypertension PCP: [name], DO VOJ:JKKXFGH Ashley Holland is a 78 y.o. female presenting to clinic today for:  1.  Hypertension Patient reports good control of blood pressure with amlodipine 5 mg daily and hydrochlorothiazide 12.5 mg daily.  No chest pain, shortness of breath, lower extremity edema.  2.  Low back pain Patient reports several month history of bilateral low back pain.  She notes that this is a soreness that seems to be more prominent on the right than the left.  No radiation to lower extremities.  No weakness.  No preceding injury.  The pain is not present today but she wanted to mention this.  Thus far no therapies tried but she admits to not being able to participate in yoga, which she previously did at least once weekly.  3.  Presyncope Patient reports that over the last year she has had 3 episodes of feeling like she was going to blackout.  To the episodes occurred while she was washing dishes and one while she was seated talking to family.  The symptoms only lasted a couple of seconds and were not accompanied by dizziness, heart palpitations, chest pain, shortness of breath or visual disturbance.  She does not recall if she ate or drink adequately that day.  She did not check blood pressures that day.  She has had eval by cardiology for prior chest pain that was found to be costochondritis.   ROS: Per HPI  No Known Allergies Past Medical History:  Diagnosis Date  . Arthritis    Per medical history form dated 11/04/09.past history DJD back  . Enlarged thyroid    "was told had nodules" - no swallowing problems  . Female bladder prolapse   . GERD (gastroesophageal reflux disease) 05/19/2013  . Malignant neoplasm of breast (female), unspecified site    right breast cancer. surgery, radiation, oral meds- Arimidex.  . Nontoxic multinodular goiter 11/10/2015  . Other and unspecified hyperlipidemia   . Rectal prolapse    stool softeners  helpful.  [name] Unspecified essential hypertension     Current Outpatient Medications:  .  amLODipine (NORVASC) 5 MG tablet, Take 1 tablet (5 mg total) by mouth daily., Disp: 90 tablet, Rfl: 3 .  ARTIFICIAL TEAR OP, Apply to eye., Disp: , Rfl:  .  Calcium Carb-Cholecalciferol (CALCIUM + D3) 600-200 MG-UNIT TABS, Take 1 tablet by mouth daily., Disp: , Rfl:  .  hydrochlorothiazide (MICROZIDE) 12.5 MG capsule, TAKE 1 CAPSULE BY MOUTH EVERY DAY, Disp: 90 capsule, Rfl: 3 .  Multiple Vitamin (MULTIVITAMIN WITH MINERALS) TABS tablet, Take 1 tablet by mouth daily., Disp: , Rfl:  Social History   Socioeconomic History  . Marital status: Married    Spouse name: Not on file  . Number of children: 2  . Years of education: Not on file  . Highest education level: Not on file  Occupational History  . Occupation: Retired    Comment: Control and instrumentation engineer  Tobacco Use  . Smoking status: Never Smoker  . Smokeless tobacco: Never Used  Substance and Sexual Activity  . Alcohol use: No  . Drug use: No  . Sexual activity: Not Currently  Other Topics Concern  . Not on file  Social History Narrative  . Not on file   Social Determinants of Health   Financial Resource Strain:   . Difficulty of Paying Living Expenses: Not on file  Food Insecurity:   . Worried About Charity fundraiser in the Last Year:  Not on file  . Ran Out of Food in the Last Year: Not on file  Transportation Needs:   . Lack of Transportation (Medical): Not on file  . Lack of Transportation (Non-Medical): Not on file  Physical Activity:   . Days of Exercise per Week: Not on file  . Minutes of Exercise per Session: Not on file  Stress:   . Feeling of Stress : Not on file  Social Connections:   . Frequency of Communication with Friends and Family: Not on file  . Frequency of Social Gatherings with Friends and Family: Not on file  . Attends Religious Services: Not on file  . Active Member of Clubs or Organizations: Not on file  .  Attends Archivist Meetings: Not on file  . Marital Status: Not on file  Intimate Partner Violence:   . Fear of Current or Ex-Partner: Not on file  . Emotionally Abused: Not on file  . Physically Abused: Not on file  . Sexually Abused: Not on file   Family History  Problem Relation Age of Onset  . Coronary artery disease Mother 45        CABG age 65  . Hyperlipidemia Mother   . Stroke Mother   . Alcohol abuse Father   . Hypertension Father   . Hypertension Sister   . Hypertension Brother   . Arthritis Brother   . Anesthesia problems Neg Hx   . Hypotension Neg Hx   . Malignant hyperthermia Neg Hx   . Pseudochol deficiency Neg Hx     Objective: Office vital signs reviewed. BP 138/83   Pulse 66   Temp 98.6 F (37 C) (Temporal)   Ht 5' 4"  (1.626 m)   Wt 161 lb (73 kg)   SpO2 96%   BMI 27.64 kg/m   Physical Examination:  General: Awake, alert, well nourished, No acute distress HEENT: Normal, sclera white, MMM, no carotid bruits Cardio: regular rate and rhythm, S1S2 heard, no murmurs appreciated Pulm: clear to auscultation bilaterally, no wheezes, rhonchi or rales; normal work of breathing on room air Extremities: warm, well perfused, No edema, cyanosis or clubbing; +2 pulses bilaterally MSK: Normal gait and station  Lumbar spine: Active range of motion appears preserved.  No tenderness to palpation to the areas of concern. Neuro: No focal neurologic deficits.  Assessment/ Plan: 78 y.o. female   1. Essential hypertension Controlled.  Continue current regimen. - CMP14+EGFR  2. Iron deficiency anemia secondary to inadequate dietary iron intake Wants to get labs for iron deficiency.  This was not discussed in detail today but may be contributing to presyncopal episodes. - CBC - Ferritin  3. Autoimmune thyroiditis Asymptomatic - Thyroid Panel With TSH  4. Pure hypercholesterolemia - Lipid Panel - CMP14+EGFR  5. Acute bilateral low back pain without  sciatica Currently not present.  I given her home physical therapy exercises to perform at home.  6. Pre-syncope Uncertain etiology.  Does not seem to be associated with any specific symptoms.  We discussed potential for cardiac etiology versus neurogenic etiology versus related to orthostasis versus blood sugar.  She will keep a diary and contact me with details if this recurs.   No orders of the defined types were placed in this encounter.  No orders of the defined types were placed in this encounter.    [name], DO [place] [phone]

## 2019-04-21 NOTE — Patient Instructions (Signed)
Please make sure to maintain a diary about these near black out spells.  Check your blood pressure and write down if you've eaten/ drank anything that day.  Also write down what you are doing during the episode.  Near-Syncope Near-syncope is when you suddenly get weak or dizzy, or you feel like you might pass out (faint). This may also be called presyncope. This is due to a lack of blood flow to the brain. During an episode of near-syncope, you may:  Feel dizzy, weak, or light-headed.  Feel sick to your stomach (nauseous).  See all white or all black.  See spots.  Have cold, clammy skin. This condition is caused by a sudden decrease in blood flow to the brain. This decrease can result from various causes, but most of those causes are not dangerous. However, near-syncope may be a sign of a serious medical problem, so it is important to seek medical care. Follow these instructions at home: Medicines  Take over-the-counter and prescription medicines only as told by your doctor.  If you are taking blood pressure or heart medicine, get up slowly and spend many minutes getting ready to sit and then stand. This can help with dizziness. General instructions  Be aware of any changes in your symptoms.  Talk with your doctor about your symptoms. You may need to have testing to find the cause of your near-syncope.  If you start to feel like you might pass out, lie down right away. Raise (elevate) your feet above the level of your heart. Breathe deeply and steadily. Wait until all of the symptoms are gone.  Have someone stay with you until you feel stable.  Do not drive, use machinery, or play sports until your doctor says it is okay.  Drink enough fluid to keep your pee (urine) pale yellow.  Keep all follow-up visits as told by your doctor. This is important. Get help right away if you:  Have a seizure.  Have pain in your: ? Chest. ? Belly (abdomen). ? Back.  Faint once or more than  once.  Have a very bad headache.  Are bleeding from your mouth or butt.  Have black or tarry poop (stool).  Have a very fast or uneven heartbeat (palpitations).  Are mixed up (confused).  Have trouble walking.  Are very weak.  Have trouble seeing. These symptoms may be an emergency. Do not wait to see if the symptoms will go away. Get medical help right away. Call your local emergency services (911 in the [place] Do not drive yourself to the hospital. Summary  Near-syncope is when you suddenly get weak or dizzy, or you feel like you might pass out (faint).  This condition is caused by a lack of blood flow to the brain.  Near-syncope may be a sign of a serious medical problem, so it is important to seek medical care. This information is not intended to replace advice given to you by your health care provider. Make sure you discuss any questions you have with your health care provider. Document Released: 09/26/2007 Document Revised: 08/01/2018 Document Reviewed: 02/26/2018 Elsevier Patient Education  2020 [employer].

## 2019-04-22 LAB — LIPID PANEL
Chol/HDL Ratio: 2.7 ratio (ref 0.0–4.4)
Cholesterol, Total: 207 mg/dL — ABNORMAL HIGH (ref 100–199)
HDL: 77 mg/dL (ref >39)
LDL Chol Calc (NIH): 118 mg/dL — ABNORMAL HIGH (ref 0–99)
Triglycerides: 67 mg/dL (ref 0–149)
VLDL Cholesterol Cal: 12 mg/dL (ref 5–40)

## 2019-04-22 LAB — CMP14+EGFR
ALT: 13 IU/L (ref 0–32)
AST: 26 IU/L (ref 0–40)
Albumin/Globulin Ratio: 1.6 (ref 1.2–2.2)
Albumin: 4.2 g/dL (ref 3.7–4.7)
Alkaline Phosphatase: 96 IU/L (ref 39–117)
BUN/Creatinine Ratio: 19 (ref 12–28)
BUN: 15 mg/dL (ref 8–27)
Bilirubin Total: 0.4 mg/dL (ref 0.0–1.2)
CO2: 25 mmol/L (ref 20–29)
Calcium: 9.4 mg/dL (ref 8.7–10.3)
Chloride: 102 mmol/L (ref 96–106)
Creatinine, Ser: 0.77 mg/dL (ref 0.57–1.00)
GFR calc Af Amer: 85 mL/min/{1.73_m2} (ref >59)
GFR calc non Af Amer: 74 mL/min/{1.73_m2} (ref >59)
Globulin, Total: 2.6 g/dL (ref 1.5–4.5)
Glucose: 81 mg/dL (ref 65–99)
Potassium: 4 mmol/L (ref 3.5–5.2)
Sodium: 140 mmol/L (ref 134–144)
Total Protein: 6.8 g/dL (ref 6.0–8.5)

## 2019-04-22 LAB — CBC
Hematocrit: 41.4 % (ref 34.0–46.6)
Hemoglobin: 14.1 g/dL (ref 11.1–15.9)
MCH: 30.3 pg (ref 26.6–33.0)
MCHC: 34.1 g/dL (ref 31.5–35.7)
MCV: 89 fL (ref 79–97)
Platelets: 289 10*3/uL (ref 150–450)
RBC: 4.65 x10E6/uL (ref 3.77–5.28)
RDW: 13.2 % (ref 11.7–15.4)
WBC: 6.5 10*3/uL (ref 3.4–10.8)

## 2019-04-22 LAB — THYROID PANEL WITH TSH
Free Thyroxine Index: 1.8 (ref 1.2–4.9)
T3 Uptake Ratio: 24 % (ref 24–39)
T4, Total: 7.7 ug/dL (ref 4.5–12.0)
TSH: 0.877 u[IU]/mL (ref 0.450–4.500)

## 2019-04-22 LAB — FERRITIN: Ferritin: 58 ng/mL (ref 15–150)

## 2019-06-17 DIAGNOSIS — Z23 Encounter for immunization: Secondary | ICD-10-CM | POA: Diagnosis not present

## 2019-07-15 DIAGNOSIS — Z23 Encounter for immunization: Secondary | ICD-10-CM | POA: Diagnosis not present

## 2019-07-31 ENCOUNTER — Other Ambulatory Visit: Payer: Self-pay | Admitting: Family Medicine

## 2019-07-31 DIAGNOSIS — I10 Essential (primary) hypertension: Secondary | ICD-10-CM

## 2019-08-20 ENCOUNTER — Other Ambulatory Visit: Payer: Self-pay | Admitting: Family Medicine

## 2019-08-20 DIAGNOSIS — I10 Essential (primary) hypertension: Secondary | ICD-10-CM

## 2019-08-25 DIAGNOSIS — C50911 Malignant neoplasm of unspecified site of right female breast: Secondary | ICD-10-CM | POA: Diagnosis not present

## 2019-08-25 DIAGNOSIS — Z853 Personal history of malignant neoplasm of breast: Secondary | ICD-10-CM | POA: Diagnosis not present

## 2019-08-25 DIAGNOSIS — Z170 Estrogen receptor positive status [ER+]: Secondary | ICD-10-CM | POA: Diagnosis not present

## 2019-08-25 DIAGNOSIS — Z923 Personal history of irradiation: Secondary | ICD-10-CM | POA: Diagnosis not present

## 2019-08-25 DIAGNOSIS — Z08 Encounter for follow-up examination after completed treatment for malignant neoplasm: Secondary | ICD-10-CM | POA: Diagnosis not present

## 2019-09-08 DIAGNOSIS — H52223 Regular astigmatism, bilateral: Secondary | ICD-10-CM | POA: Diagnosis not present

## 2019-09-08 DIAGNOSIS — H25813 Combined forms of age-related cataract, bilateral: Secondary | ICD-10-CM | POA: Diagnosis not present

## 2019-09-08 DIAGNOSIS — H43811 Vitreous degeneration, right eye: Secondary | ICD-10-CM | POA: Diagnosis not present

## 2019-09-08 DIAGNOSIS — H524 Presbyopia: Secondary | ICD-10-CM | POA: Diagnosis not present

## 2019-09-08 DIAGNOSIS — H5203 Hypermetropia, bilateral: Secondary | ICD-10-CM | POA: Diagnosis not present

## 2019-10-20 ENCOUNTER — Encounter: Payer: Self-pay | Admitting: Family Medicine

## 2019-10-20 ENCOUNTER — Ambulatory Visit (INDEPENDENT_AMBULATORY_CARE_PROVIDER_SITE_OTHER): Payer: Medicare Other | Admitting: Family Medicine

## 2019-10-20 ENCOUNTER — Telehealth: Payer: Self-pay | Admitting: Family Medicine

## 2019-10-20 ENCOUNTER — Other Ambulatory Visit: Payer: Self-pay

## 2019-10-20 VITALS — BP 137/73 | HR 66 | Temp 97.8°F | Wt 156.0 lb

## 2019-10-20 DIAGNOSIS — Z636 Dependent relative needing care at home: Secondary | ICD-10-CM | POA: Diagnosis not present

## 2019-10-20 DIAGNOSIS — I10 Essential (primary) hypertension: Secondary | ICD-10-CM | POA: Diagnosis not present

## 2019-10-20 DIAGNOSIS — Z23 Encounter for immunization: Secondary | ICD-10-CM | POA: Diagnosis not present

## 2019-10-20 NOTE — Patient Instructions (Signed)
Nicki Reaper will reach out to you as well.   Dementia Caregiver Guide Dementia is a term used to describe a number of symptoms that affect memory and thinking. The most common symptoms include:  Memory loss.  Trouble with language and communication.  Trouble concentrating.  Poor judgment.  Problems with reasoning.  Child-like behavior and language.  Extreme anxiety.  Angry outbursts.  Wandering from home or public places. Dementia usually gets worse slowly over time. In the early stages, people with dementia can stay independent and safe with some help. In later stages, they need help with daily tasks such as dressing, grooming, and using the bathroom. How to help the person with dementia cope Dementia can be frightening and confusing. Here are some tips to help the person with dementia cope with changes caused by the disease. General tips  Keep the person on track with his or her routine.  Try to identify areas where the person may need help.  Be supportive, patient, calm, and encouraging.  Gently remind the person that adjusting to changes takes time.  Help with the tasks that the person has asked for help with.  Keep the person involved in daily tasks and decisions as much as possible.  Encourage conversation, but try not to get frustrated or harried if the person struggles to find words or does not seem to appreciate your help. Communication tips  When the person is talking or seems frustrated, make eye contact and hold the person's hand.  Ask specific questions that need yes or no answers.  Use simple words, short sentences, and a calm voice. Only give one direction at a time.  When offering choices, limit them to just 1 or 2.  Avoid correcting the person in a negative way.  If the person is struggling to find the right words, gently try to help him or her. How to recognize symptoms of stress Symptoms of stress in caregivers include:  Feeling frustrated or  angry with the person with dementia.  Denying that the person has dementia or that his or her symptoms will not improve.  Feeling hopeless and unappreciated.  Difficulty sleeping.  Difficulty concentrating.  Feeling anxious, irritable, or depressed.  Developing stress-related health problems.  Feeling like you have too little time for your own life. Follow these instructions at home:   Make sure that you and the person you are caring for: ? Get regular sleep. ? Exercise regularly. ? Eat regular, nutritious meals. ? Drink enough fluid to keep your urine clear or pale yellow. ? Take over-the-counter and prescription medicines only as told by your health care providers. ? Attend all scheduled health care appointments.  Join a support group with others who are caregivers.  Ask about respite care resources so that you can have a regular break from the stress of caregiving.  Look for signs of stress in yourself and in the person you are caring for. If you notice signs of stress, take steps to manage it.  Consider any safety risks and take steps to avoid them.  Organize medications in a pill box for each day of the week.  Create a plan to handle any legal or financial matters. Get legal or financial advice if needed.  Keep a calendar in a central location to remind the person of appointments or other activities. Tips for reducing the risk of injury  Keep floors clear of clutter. Remove rugs, magazine racks, and floor lamps.  Keep hallways well lit, especially at night.  Put a handrail and nonslip mat in the bathtub or shower.  Put childproof locks on cabinets that contain dangerous items, such as medicines, alcohol, guns, toxic cleaning items, sharp tools or utensils, matches, and lighters.  Put the locks in places where the person cannot see or reach them easily. This will help ensure that the person does not wander out of the house and get lost.  Be prepared for  emergencies. Keep a list of emergency phone numbers and addresses in a convenient area.  Remove car keys and lock garage doors so that the person does not try to get in the car and drive.  Have the person wear a bracelet that tracks locations and identifies the person as having memory problems. This should be worn at all times for safety. Where to find support: Many individuals and organizations offer support. These include:  Support groups for people with dementia and for caregivers.  Counselors or therapists.  Home health care services.  Adult day care centers. Where to find more information Alzheimer[employer]: [employer] Contact a health care provider if:  The person's health is rapidly getting worse.  You are no longer able to care for the person.  Caring for the person is affecting your physical and emotional health.  The person threatens himself or herself, you, or anyone else. Summary  Dementia is a term used to describe a number of symptoms that affect memory and thinking.  Dementia usually gets worse slowly over time.  Take steps to reduce the person's risk of injury, and to plan for future care.  Caregivers need support, relief from caregiving, and time for their own lives. This information is not intended to replace advice given to you by your health care provider. Make sure you discuss any questions you have with your health care provider. Document Revised: 03/22/2017 Document Reviewed: 03/13/2016 Elsevier Patient Education  2020 [employer].

## 2019-10-20 NOTE — Progress Notes (Signed)
Subjective: CC: Follow-up hypertension PCP: [name], DO JOA:CZYSAYT Ashley Holland is a 79 y.o. female presenting to clinic today for:  1.  Hypertension Patient reports good control of blood pressure with amlodipine 5 mg daily and hydrochlorothiazide 12.5 mg daily. No CP, SOB, edema, falls, dizziness.  She did have a headache a few days ago that she thinks was stress-induced.  It occurred on the right side of her head.  It was relieved by 400 mg of Motrin.  Denies any neurologic deficits at that time  She does report some stress with relation to her spouse, who has vascular dementia.  She would like more resources on caregiver support.  She does not think that she needs any medications for stress at this time but would be open to this if things got worse.  They will be taking a trip to [place] in a couple of months to visit family and friends.  ROS: Per HPI  No Known Allergies Past Medical History:  Diagnosis Date   Arthritis    Per medical history form dated 11/04/09.past history DJD back   Enlarged thyroid    "was told had nodules" - no swallowing problems   Female bladder prolapse    GERD (gastroesophageal reflux disease) 05/19/2013   Malignant neoplasm of breast (female), unspecified site    right breast cancer. surgery, radiation, oral meds- Arimidex.   Nontoxic multinodular goiter 11/10/2015   Other and unspecified hyperlipidemia    Rectal prolapse    stool softeners helpful.   Unspecified essential hypertension     Current Outpatient Medications:    amLODipine (NORVASC) 5 MG tablet, TAKE 1 TABLET BY MOUTH EVERY DAY, Disp: 90 tablet, Rfl: 0   ARTIFICIAL TEAR OP, Apply to eye., Disp: , Rfl:    Calcium Carb-Cholecalciferol (CALCIUM + D3) 600-200 MG-UNIT TABS, Take 1 tablet by mouth daily., Disp: , Rfl:    hydrochlorothiazide (MICROZIDE) 12.5 MG capsule, TAKE 1 CAPSULE BY MOUTH EVERY DAY, Disp: 90 capsule, Rfl: 0   Multiple Vitamin (MULTIVITAMIN WITH  MINERALS) TABS tablet, Take 1 tablet by mouth daily., Disp: , Rfl:  Social History   Socioeconomic History   Marital status: Married    Spouse name: Not on file   Number of children: 2   Years of education: Not on file   Highest education level: Not on file  Occupational History   Occupation: Retired    Comment: Control and instrumentation engineer  Tobacco Use   Smoking status: Never Smoker   Smokeless tobacco: Never Used  Scientific laboratory technician Use: Never used  Substance and Sexual Activity   Alcohol use: No   Drug use: No   Sexual activity: Not Currently  Other Topics Concern   Not on file  Social History Narrative   Not on file   Social Determinants of Health   Financial Resource Strain:    Difficulty of Paying Living Expenses:   Food Insecurity:    Worried About Charity fundraiser in the Last Year:    Arboriculturist in the Last Year:   Transportation Needs:    Film/video editor (Medical):    Lack of Transportation (Non-Medical):   Physical Activity:    Days of Exercise per Week:    Minutes of Exercise per Session:   Stress:    Feeling of Stress :   Social Connections:    Frequency of Communication with Friends and Family:    Frequency of Social Gatherings with Friends and  Family:    Attends Religious Services:    Active Member of Clubs or Organizations:    Attends Music therapist:    Marital Status:   Intimate Partner Violence:    Fear of Current or Ex-Partner:    Emotionally Abused:    Physically Abused:    Sexually Abused:    Family History  Problem Relation Age of Onset   Coronary artery disease Mother 29        CABG age 54   Hyperlipidemia Mother    Stroke Mother    Alcohol abuse Father    Hypertension Father    Hypertension Sister    Hypertension Brother    Arthritis Brother    Anesthesia problems Neg Hx    Hypotension Neg Hx    Malignant hyperthermia Neg Hx    Pseudochol deficiency Neg Hx      Objective: Office vital signs reviewed. BP 137/73    Pulse 66    Temp 97.8 F (36.6 C)    Wt 156 lb (70.8 kg)    SpO2 98%    BMI 26.78 kg/m   Physical Examination:  General: Awake, alert, well nourished, No acute distress HEENT: Normal, sclera white, MMM  Cardio: regular rate and rhythm, S1S2 heard, no murmurs appreciated Pulm: clear to auscultation bilaterally, no wheezes, rhonchi or rales; normal work of breathing on room air Extremities: warm, well perfused, No edema, cyanosis or clubbing; +2 pulses bilaterally Psych: Mood stable, speech normal, affect appropriate, pleasant interactive  Depression screen [facility] 2/9 10/20/2019 04/21/2019 10/20/2018 09/29/2018 06/18/2018  Decreased Interest 0 0 0 0 0  Down, Depressed, Hopeless 0 0 0 0 0  PHQ - 2 Score 0 0 0 0 0  Altered sleeping 0 0 - - 0  Tired, decreased energy 0 0 - - 0  Change in appetite 0 0 - - 0  Feeling bad or failure about yourself  0 0 - - 0  Trouble concentrating 0 0 - - 0  Moving slowly or fidgety/restless 0 0 - - 0  Suicidal thoughts 0 0 - - 0  PHQ-9 Score 0 0 - - 0     Assessment/ Plan: 79 y.o. female   1. Essential hypertension Well-controlled.  No labs needed today.  She will follow-up in 6 months for fasting labs with annual physical  2. Caregiver stress At this time not requiring a medication but I am going to place a referral to clinical social work for further assistance and support.  I have given her Alzheimer Association information and handouts today.  I encouraged her to follow-up with me anytime if symptoms worsen we can consider initiating medication for this.  3. Need for pneumococcal vaccination Administered  - Pneumococcal conjugate vaccine 13-valent  Orders Placed This Encounter  Procedures   Pneumococcal conjugate vaccine 13-valent   Referral to Chronic Care Management Services    Referral Priority:   Routine    Referral Type:   Consultation    Referral Reason:   Care Coordination     Number of Visits Requested:   1   No orders of the defined types were placed in this encounter.    [name], DO [place] [phone]

## 2019-10-20 NOTE — Chronic Care Management (AMB) (Signed)
  Chronic Care Management   Note  10/20/2019 Name: Ashley Holland MRN: [id number] DOB: [birth year]  Ashley Holland is a 79 y.o. year old female who is a primary care patient of [name], DO. I reached out to Lenoria Farrier by phone today in response to a referral sent by Ashley Holland's PCP, [name], DO.     [name] was given information about Chronic Care Management services today including:  1. CCM service includes personalized support from designated clinical staff supervised by her physician, including individualized plan of care and coordination with other care providers 2. 24/7 contact phone numbers for assistance for urgent and routine care needs. 3. Service will only be billed when office clinical staff spend 20 minutes or more in a month to coordinate care. 4. Only one practitioner may furnish and bill the service in a calendar month. 5. The patient may stop CCM services at any time (effective at the end of the month) by phone call to the office staff. 6. The patient will be responsible for cost sharing (co-pay) of up to 20% of the service fee (after annual deductible is met).  Patient agreed to services and verbal consent obtained.   Follow up plan: Telephone appointment with care management team member scheduled for: 12/22/2019. Patient requested appointment with LCSW for 12/22/2019. Gave patient direct number to call if needed appointment sooner Erline Levine [phone] will message LCSW and PCP to make them aware patient wanted to wait for appointment until after vaction.   [place], [place] [address] Direct Dial: [phone] [email or web address] Website: [email or web address]

## 2019-10-24 ENCOUNTER — Other Ambulatory Visit: Payer: Self-pay | Admitting: Family Medicine

## 2019-10-24 DIAGNOSIS — I10 Essential (primary) hypertension: Secondary | ICD-10-CM

## 2019-11-12 ENCOUNTER — Other Ambulatory Visit: Payer: Self-pay | Admitting: Family Medicine

## 2019-11-12 DIAGNOSIS — I10 Essential (primary) hypertension: Secondary | ICD-10-CM

## 2019-12-22 ENCOUNTER — Telehealth: Payer: Medicare Other

## 2020-01-07 ENCOUNTER — Ambulatory Visit (INDEPENDENT_AMBULATORY_CARE_PROVIDER_SITE_OTHER): Payer: Medicare Other | Admitting: Licensed Clinical Social Worker

## 2020-01-07 DIAGNOSIS — I10 Essential (primary) hypertension: Secondary | ICD-10-CM

## 2020-01-07 DIAGNOSIS — E7800 Pure hypercholesterolemia, unspecified: Secondary | ICD-10-CM

## 2020-01-07 DIAGNOSIS — D508 Other iron deficiency anemias: Secondary | ICD-10-CM

## 2020-01-07 DIAGNOSIS — M1611 Unilateral primary osteoarthritis, right hip: Secondary | ICD-10-CM

## 2020-01-07 NOTE — Patient Instructions (Addendum)
Licensed Clinical Social Worker Visit Information  Goals we discussed today:  Goals Addressed              This Visit's Progress   .  Client will talk with LCSW in next 30 days about managing stress in caring for needs of her spouse (pt-stated)   On track     CARE PLAN ENTRY   Current Barriers:  . Patient with Chronic diagnoses of Anemia, OA, Malignant neoplasm of right breast,HTN, HLD  Clinical Social Work Clinical Goal(s):  [name] LCSW will call client in next 30 days to talk with her about managing stress in caring for needs of her spouse  Interventions: . Talked with client about CCM program support . Talked with client  about pain issues of client . Talked with client about recent fall of her spouse . Talked with client about health needs of her spouse (Spouse has Dementia diagnosis) . Talked with client about ADLs completion of client . Talked with client about transport needs (she drives car as needed) . Talked with client about mobility issues of client . Talked with client about vision of client (she has new glasses) . Talked with client about her upcoming medical appointments of client . Talked with client about relaxation techniques (does crossword puzzles, likes to walk, active at church, plays scrabble with friends) . Talked with client about food provision and meal provision of client . Provided counseling support for client . Talked with client about resource for Alzheimer's caregivers ([facility] [facility]) . Talked with client about energy level of client . Encouraged client to call RNCM as needed for nursing support  Patient Self Care Activities:   Does ADLs independently Drives car as needed to appointments and to complete errands  Patient Self Care Deficits:  [name] Managing stress issues at home is challenging . Caring for needs of her spouse is challenging  Initial goal documentation       Materials Provided: No  Follow Up Plan: LCSW will  call client in next 4 weeks to talk with client about managing stress in caring for the needs of her spouse  The patient verbalized understanding of instructions provided today and declined a print copy of patient instruction materials.   [name].Dameian Crisman MSW, LCSW Licensed Clinical Social Worker [facility] Care Management [phone]

## 2020-01-07 NOTE — Chronic Care Management (AMB) (Signed)
Chronic Care Management    Clinical Social Work Follow Up Note  01/07/2020 Name: RUNA WHITTINGHAM MRN: [id number] DOB: 28-May-1940  Riesa Pope Soh is a 79 y.o. year old female who is a primary care patient of [name], DO. The CCM team was consulted for assistance with [employer] .   Review of patient status, including review of consultants reports, other relevant assessments, and collaboration with appropriate care team members and the patient's provider was performed as part of comprehensive patient evaluation and provision of chronic care management services.    SDOH (Social Determinants of Health) assessments performed: Yes; risk for depression; risk for stress; risk for tobacco use;   SDOH Interventions     Most Recent Value  SDOH Interventions  Depression Interventions/Treatment  --  [LCSW informed Lorayne about RNCM support and LCSW support]        Chronic Care Management from 01/07/2020 in [place]  PHQ-9 Total Score 3     GAD 7 : Generalized Anxiety Score 01/07/2020  Nervous, Anxious, on Edge 0  Control/stop worrying 0  Worry too much - different things 0  Trouble relaxing 0  Restless 0  Easily annoyed or irritable 0  Afraid - awful might happen 0  Total GAD 7 Score 0  Anxiety Difficulty Somewhat difficult    Outpatient Encounter Medications as of 01/07/2020  Medication Sig  . amLODipine (NORVASC) 5 MG tablet TAKE 1 TABLET BY MOUTH EVERY DAY  . ARTIFICIAL TEAR OP Apply to eye.  . Calcium Carb-Cholecalciferol (CALCIUM + D3) 600-200 MG-UNIT TABS Take 1 tablet by mouth daily.  . hydrochlorothiazide (MICROZIDE) 12.5 MG capsule TAKE 1 CAPSULE BY MOUTH EVERY DAY  . Multiple Vitamin (MULTIVITAMIN WITH MINERALS) TABS tablet Take 1 tablet by mouth daily.   No facility-administered encounter medications on file as of 01/07/2020.     Goals Addressed              This Visit's Progress   .  Client will talk with LCSW in next 30  days about managing stress in caring for needs of her spouse (pt-stated)   On track     CARE PLAN ENTRY   Current Barriers:  . Patient with Chronic diagnoses of Anemia, OA, Malignant neoplasm of right breast,HTN, HLD  Clinical Social Work Clinical Goal(s):  [name] LCSW will call client in next 30 days to talk with her about managing stress in caring for needs of her spouse  Interventions: . Talked with client about CCM program support . Talked with client  about pain issues of client . Talked with client about recent fall of her spouse . Talked with client about health needs of her spouse (Spouse has Dementia diagnosis) . Talked with client about ADLs completion of client . Talked with client about transport needs (she drives car as needed) . Talked with client about mobility issues of client . Talked with client about vision of client (she has new glasses) . Talked with client about her upcoming medical appointments of client . Talked with client about relaxation techniques (does crossword puzzles, likes to walk, active at church, plays scrabble with friends) . Talked with client about food provision and meal provision of client . Provided counseling support for client . Talked with client about resource for Alzheimer's caregivers ([facility] [facility]) . Talked with client about energy level of client . Encouraged client to call RNCM as needed for nursing support  Patient Self Care Activities:  Does ADLs independently Drives car as needed to appointments and to complete errands  Patient Self Care Deficits:  [name] Managing stress issues at home is challenging . Caring for needs of her spouse is challenging  Initial goal documentation       Follow Up Plan: LCSW will call client in next 4 weeks to talk with client about managing stress in caring for the needs of her spouse  [name].Zakir Henner MSW, LCSW Licensed Clinical Social Worker [facility] Care Management [phone]

## 2020-02-05 ENCOUNTER — Ambulatory Visit (INDEPENDENT_AMBULATORY_CARE_PROVIDER_SITE_OTHER): Payer: Medicare Other | Admitting: Licensed Clinical Social Worker

## 2020-02-05 ENCOUNTER — Other Ambulatory Visit: Payer: Self-pay | Admitting: Family Medicine

## 2020-02-05 DIAGNOSIS — D508 Other iron deficiency anemias: Secondary | ICD-10-CM | POA: Diagnosis not present

## 2020-02-05 DIAGNOSIS — E7800 Pure hypercholesterolemia, unspecified: Secondary | ICD-10-CM | POA: Diagnosis not present

## 2020-02-05 DIAGNOSIS — M1611 Unilateral primary osteoarthritis, right hip: Secondary | ICD-10-CM | POA: Diagnosis not present

## 2020-02-05 DIAGNOSIS — I10 Essential (primary) hypertension: Secondary | ICD-10-CM | POA: Diagnosis not present

## 2020-02-05 NOTE — Patient Instructions (Addendum)
Licensed Clinical Education officer, museum Visit Information  Goals we discussed today:     Client will talk with LCSW in next 30 days about managing stress in caring for needs of her spouse (pt-stated)         CARE PLAN ENTRY   Current Barriers:   Patient with Chronic diagnoses of Anemia, OA, Malignant neoplasm of right breast,HTN, HLD  Clinical Social Work Clinical Goal(s):   LCSW will call client in next 30 days to talk with her about managing stress in caring for needs of her spouse  Interventions:  Talked with client about CCM program support  Talked with client  about pain issues of client  Talked with client about health needs of her spouse (Spouse has Dementia diagnosis)  Talked with client about ADLs completion of client  Talked with client about transport needs (she drives car as needed)  Talked with client about mobility issues of client  Talked with client about vision of client (she has new glasses)  Talked with client about her upcoming medical appointments of client  Talked with client about relaxation techniques (does crossword puzzles, likes to walk, active at church, plays scrabble with friends)  Talked with client about food provision and meal provision of client  Talked with client about resource for Alzheimer's caregivers ([facility] [facility])  Talked with client about energy level of client  Encouraged client to call RNCM as needed for nursing support  Talked with Swara about sleeping issues of client  Talked with Cadance about family support (daughter is supportive and grandchildren are supportive)  Talked with Jessicamarie about [place] resources that may be of help to her spouse in addressing his current needs   Patient Self Care Activities:   Does ADLs independently Drives car as needed to appointments and to complete errands  Patient Self Care Deficits:   Managing stress issues at home is challenging  Caring for needs  of her spouse is challenging  Initial goal documentation     Follow Up Plan: LCSW will call client in next 4 weeks to talk with client about managing stress in caring for the needs of her spouse  Materials Provided: No  The patient verbalized understanding of instructions provided today and declined a print copy of patient instruction materials.   [name].Ilithyia Titzer MSW, LCSW Licensed Clinical Social Worker [facility] Care [facility] [phone]

## 2020-02-05 NOTE — Chronic Care Management (AMB) (Signed)
Chronic Care Management    Clinical Social Work Follow Up Note  02/05/2020 Name: Ashley Holland MRN: [id number] DOB: [birth year]  Ashley Holland is a 79 y.o. year old female who is a primary care patient of [name], DO. The CCM team was consulted for assistance with [employer] .   Review of patient status, including review of consultants reports, other relevant assessments, and collaboration with appropriate care team members and the patient's provider was performed as part of comprehensive patient evaluation and provision of chronic care management services.    SDOH (Social Determinants of Health) assessments performed: No; risk for depression; risk for tobacco use; risk for stress; risk for physical inactivity    Chronic Care Management from 01/07/2020 in [place]  PHQ-9 Total Score 3     GAD 7 : Generalized Anxiety Score 01/07/2020  Nervous, Anxious, on Edge 0  Control/stop worrying 0  Worry too much - different things 0  Trouble relaxing 0  Restless 0  Easily annoyed or irritable 0  Afraid - awful might happen 0  Total GAD 7 Score 0  Anxiety Difficulty Somewhat difficult    Outpatient Encounter Medications as of 02/05/2020  Medication Sig  . amLODipine (NORVASC) 5 MG tablet TAKE 1 TABLET BY MOUTH EVERY DAY  . ARTIFICIAL TEAR OP Apply to eye.  . Calcium Carb-Cholecalciferol (CALCIUM + D3) 600-200 MG-UNIT TABS Take 1 tablet by mouth daily.  . hydrochlorothiazide (MICROZIDE) 12.5 MG capsule TAKE 1 CAPSULE BY MOUTH EVERY DAY  . Multiple Vitamin (MULTIVITAMIN WITH MINERALS) TABS tablet Take 1 tablet by mouth daily.   No facility-administered encounter medications on file as of 02/05/2020.    Goals    .  Client will talk with LCSW in next 30 days about managing stress in caring for needs of her spouse (pt-stated)      CARE PLAN ENTRY   Current Barriers:  . Patient with Chronic diagnoses of Anemia, OA, Malignant neoplasm of right  breast,HTN, HLD  Clinical Social Work Clinical Goal(s):  [name] LCSW will call client in next 30 days to talk with her about managing stress in caring for needs of her spouse  Interventions: . Talked with client about CCM program support . Talked with client  about pain issues of client . Talked with client about health needs of her spouse (Spouse has Dementia diagnosis) . Talked with client about ADLs completion of client . Talked with client about transport needs (she drives car as needed) . Talked with client about mobility issues of client . Talked with client about vision of client (she has new glasses) . Talked with client about her upcoming medical appointments of client . Talked with client about relaxation techniques (does crossword puzzles, likes to walk, active at church, plays scrabble with friends) . Talked with client about food provision and meal provision of client . Talked with client about resource for Alzheimer's caregivers ([place] [facility]) . Talked with client about energy level of client . Encouraged client to call RNCM as needed for nursing support . Talked with Kenyia about sleeping issues of client . Talked with Jullisa about family support (daughter is supportive and grandchildren are supportive) . Talked with Marcianna about [place] resources that may be of help to her spouse in addressing his current needs   Patient Self Care Activities:   Does ADLs independently Drives car as needed to appointments and to complete errands  Patient Self Care Deficits:  [name] Managing  stress issues at home is challenging . Caring for needs of her spouse is challenging  Initial goal documentation     Follow Up Plan: LCSW will call client in next 4 weeks to talk with client about managing stress in caring for the needs of her spouse  Ashley Holland.Robey Massmann MSW, LCSW Licensed Clinical Social Worker [facility] [phone]

## 2020-02-08 MED ORDER — HYDROCHLOROTHIAZIDE 12.5 MG PO CAPS: 12.5000 mg | ORAL_CAPSULE | Freq: Every day | ORAL | 0 refills | Status: DC

## 2020-02-08 NOTE — Telephone Encounter (Signed)
E-prescribe down. resent 

## 2020-02-08 NOTE — Addendum Note (Signed)
Addended by: Antonietta Barcelona D on: 02/08/2020 09:27 AM   Modules accepted: Orders

## 2020-03-02 DIAGNOSIS — Z23 Encounter for immunization: Secondary | ICD-10-CM | POA: Diagnosis not present

## 2020-03-11 ENCOUNTER — Ambulatory Visit: Payer: Medicare Other | Admitting: Licensed Clinical Social Worker

## 2020-03-11 DIAGNOSIS — D508 Other iron deficiency anemias: Secondary | ICD-10-CM

## 2020-03-11 DIAGNOSIS — E7800 Pure hypercholesterolemia, unspecified: Secondary | ICD-10-CM

## 2020-03-11 DIAGNOSIS — I10 Essential (primary) hypertension: Secondary | ICD-10-CM

## 2020-03-11 DIAGNOSIS — M1611 Unilateral primary osteoarthritis, right hip: Secondary | ICD-10-CM

## 2020-03-11 NOTE — Chronic Care Management (AMB) (Signed)
Chronic Care Management    Clinical Social Work Follow Up Note  03/11/2020 Name: SANAM MARMO MRN: [id number] DOB: 08-02-1940  Riesa Pope Goodwill is a 79 y.o. year old female who is a primary care patient of [name], DO. The CCM team was consulted for assistance with [employer] .   Review of patient status, including review of consultants reports, other relevant assessments, and collaboration with appropriate care team members and the patient's provider was performed as part of comprehensive patient evaluation and provision of chronic care management services.    SDOH (Social Determinants of Health) assessments performed: No; risk for depression; risk for tobacco use; risk for stress; risk for physical inactivity    Chronic Care Management from 01/07/2020 in [place]  PHQ-9 Total Score 3       GAD 7 : Generalized Anxiety Score 01/07/2020  Nervous, Anxious, on Edge 0  Control/stop worrying 0  Worry too much - different things 0  Trouble relaxing 0  Restless 0  Easily annoyed or irritable 0  Afraid - awful might happen 0  Total GAD 7 Score 0  Anxiety Difficulty Somewhat difficult    Outpatient Encounter Medications as of 03/11/2020  Medication Sig  . amLODipine (NORVASC) 5 MG tablet TAKE 1 TABLET BY MOUTH EVERY DAY  . ARTIFICIAL TEAR OP Apply to eye.  . Calcium Carb-Cholecalciferol (CALCIUM + D3) 600-200 MG-UNIT TABS Take 1 tablet by mouth daily.  . hydrochlorothiazide (MICROZIDE) 12.5 MG capsule Take 1 capsule (12.5 mg total) by mouth daily.  . Multiple Vitamin (MULTIVITAMIN WITH MINERALS) TABS tablet Take 1 tablet by mouth daily.   No facility-administered encounter medications on file as of 03/11/2020.    Goals    .  Client will talk with LCSW in next 30 days about managing stress in caring for needs of her spouse (pt-stated)      CARE PLAN ENTRY   Current Barriers:  . Patient with Chronic diagnoses of Anemia, OA, Malignant  neoplasm of right breast,HTN, HLD  Clinical Social Work Clinical Goal(s):  [name] LCSW will call client in next 30 days to talk with her about managing stress in caring for needs of her spouse  Interventions: . Talked with client about CCM program support . Talked with client about health needs of her spouse (Spouse has Dementia diagnosis) . Talked with client about ADLs completion of client . Talked with client about transport needs (she drives car as needed) . Talked with client about mobility issues of client . Talked with client about vision of client (she has new glasses) . Talked with client about her upcoming medical appointments of client . Talked with client about relaxation techniques (does crossword puzzles, likes to walk, active at church, plays scrabble with friends) . Talked with client about food provision and meal provision of client . Provided counseling support for client . Talked with client about resource for Alzheimer's caregivers ([facility]) . Talked with client about energy level of client . Encouraged client to call RNCM as needed for nursing support . Talked with client about sleeping issues of client  Patient Self Care Activities:   Does ADLs independently Drives car as needed to appointments and to complete errands  Patient Self Care Deficits:  [name] Managing stress issues at home is challenging . Caring for needs of her spouse is challenging  Initial goal documentation     Follow Up Plan:  LCSW will call client in next 4 weeks  to talk with client about managing stress in caring for the needs of her spouse  [name].Wake Conlee MSW, LCSW Licensed Clinical Social Worker [facility] Care Management [phone]

## 2020-03-11 NOTE — Patient Instructions (Addendum)
Licensed Clinical Social Worker Visit Information  Goals we discussed today:    .  Client will talk with LCSW in next 30 days about managing stress in caring for needs of her spouse (pt-stated)        CARE PLAN ENTRY   Current Barriers:   Patient with Chronic diagnoses of Anemia, OA, Malignant neoplasm of right breast,HTN, HLD  Clinical Social Work Clinical Goal(s):   LCSW will call client in next 30 days to talk with her about managing stress in caring for needs of her spouse  Interventions:  Talked with client about CCM program support  Talked with client about health needs of her spouse (Spouse has Dementia diagnosis)  Talked with client about ADLs completion of client  Talked with client about transport needs (she drives car as needed)  Talked with client about mobility issues of client  Talked with client about vision of client (she has new glasses)  Talked with client about her upcoming medical appointments of client  Talked with client about relaxation techniques (does crossword puzzles, likes to walk, active at church, plays scrabble with friends)  Talked with client about food provision and meal provision of client  Provided counseling support for client  Talked with client about resource for Alzheimer's caregivers ([facility])  Talked with client about energy level of client  Encouraged client to call RNCM as needed for nursing support  Talked with client about sleeping issues of client  Patient Self Care Activities:   Does ADLs independently Drives car as needed to appointments and to complete errands  Patient Self Care Deficits:   Managing stress issues at home is challenging  Caring for needs of her spouse is challenging  Initial goal documentation     Follow Up Plan: LCSW will call client in next 4 weeks to talk with client about managing stress in caring for the needs of her spouse  Materials  Provided: No  The patient verbalized understanding of instructions provided today and declined a print copy of patient instruction materials.   [name].Camylle Whicker MSW, LCSW Licensed Clinical Social Worker [facility] Care Management [phone]

## 2020-03-23 ENCOUNTER — Ambulatory Visit (INDEPENDENT_AMBULATORY_CARE_PROVIDER_SITE_OTHER): Payer: Medicare Other | Admitting: Family Medicine

## 2020-03-23 DIAGNOSIS — R3989 Other symptoms and signs involving the genitourinary system: Secondary | ICD-10-CM | POA: Diagnosis not present

## 2020-03-23 MED ORDER — CEPHALEXIN 500 MG PO CAPS: 500.0000 mg | ORAL_CAPSULE | Freq: Two times a day (BID) | ORAL | 0 refills | Status: AC

## 2020-03-23 NOTE — Progress Notes (Signed)
Telephone visit  Subjective: CC:UTI PCP: Ashley Norlander, DO QIH:[id number] Ashley Holland is a 79 y.o. female calls for telephone consult today. Patient provides verbal consent for consult held via phone.  Due to COVID-19 pandemic this visit was conducted virtually. This visit type was conducted due to national recommendations for restrictions regarding the COVID-19 Pandemic (e.g. social distancing, sheltering in place) in an effort to limit this patient's exposure and mitigate transmission in our community. All issues noted in this document were discussed and addressed.  A physical exam was not performed with this format.   Location of patient: home Location of provider: [facility] Others present for call: none  1. UTI Onset on Saturday.  She had foul odor to urine a few days before.  She had burning but she has been using AZO.  She reports frequency, vaginal irritation.  No nausea, vomiting or fevers. No vaginal discharge.  ROS: Per HPI  No Known Allergies Past Medical History:  Diagnosis Date  . Arthritis    Per medical history form dated 11/04/09.past history DJD back  . Enlarged thyroid    "was told had nodules" - no swallowing problems  . Female bladder prolapse   . GERD (gastroesophageal reflux disease) 05/19/2013  . Malignant neoplasm of breast (female), unspecified site    right breast cancer. surgery, radiation, oral meds- Arimidex.  . Nontoxic multinodular goiter 11/10/2015  . Other and unspecified hyperlipidemia   . Rectal prolapse    stool softeners helpful.  [name] Unspecified essential hypertension     Current Outpatient Medications:  .  amLODipine (NORVASC) 5 MG tablet, TAKE 1 TABLET BY MOUTH EVERY DAY, Disp: 90 tablet, Rfl: 1 .  ARTIFICIAL TEAR OP, Apply to eye., Disp: , Rfl:  .  Calcium Carb-Cholecalciferol (CALCIUM + D3) 600-200 MG-UNIT TABS, Take 1 tablet by mouth daily., Disp: , Rfl:  .  hydrochlorothiazide (MICROZIDE) 12.5 MG capsule, Take 1 capsule (12.5 mg total) by mouth  daily., Disp: 90 capsule, Rfl: 0 .  Multiple Vitamin (MULTIVITAMIN WITH MINERALS) TABS tablet, Take 1 tablet by mouth daily., Disp: , Rfl:   Assessment/ Plan: 79 y.o. female   Suspected UTI - Plan: cephALEXin (KEFLEX) 500 MG capsule, Urinalysis, Complete, Urine Culture  Highly suspicious for UTI.  I have placed a future order for urinalysis if symptoms do not improve in the next 48 hours.  I have also sent in Keflex.  She will take this twice daily for the next 7 days.  Home care instructions reviewed.  Reasons for return discussed.  Follow-up as needed  Start time: 11:33am End time: 11:38am  Total time spent on patient care (including telephone call/ virtual visit): 5 minutes  [name], [place] [phone]

## 2020-04-18 ENCOUNTER — Telehealth: Payer: Medicare Other

## 2020-04-20 ENCOUNTER — Other Ambulatory Visit: Payer: Self-pay

## 2020-04-20 ENCOUNTER — Ambulatory Visit (INDEPENDENT_AMBULATORY_CARE_PROVIDER_SITE_OTHER): Payer: Medicare Other | Admitting: Family Medicine

## 2020-04-20 VITALS — BP 139/82 | HR 68 | Temp 97.0°F | Ht 64.0 in | Wt 159.0 lb

## 2020-04-20 DIAGNOSIS — Z636 Dependent relative needing care at home: Secondary | ICD-10-CM | POA: Diagnosis not present

## 2020-04-20 DIAGNOSIS — Z853 Personal history of malignant neoplasm of breast: Secondary | ICD-10-CM | POA: Diagnosis not present

## 2020-04-20 DIAGNOSIS — E7800 Pure hypercholesterolemia, unspecified: Secondary | ICD-10-CM | POA: Diagnosis not present

## 2020-04-20 DIAGNOSIS — I10 Essential (primary) hypertension: Secondary | ICD-10-CM | POA: Diagnosis not present

## 2020-04-20 DIAGNOSIS — Z23 Encounter for immunization: Secondary | ICD-10-CM | POA: Diagnosis not present

## 2020-04-20 DIAGNOSIS — M25552 Pain in left hip: Secondary | ICD-10-CM

## 2020-04-20 NOTE — Patient Instructions (Signed)

## 2020-04-20 NOTE — Progress Notes (Signed)
Subjective: CC: Hypertension with hyperlipidemia PCP: [name], DO RCV:Ashley Holland is a 79 y.o. female presenting to clinic today for:  1.  Hypertension with hyperlipidemia Patient is compliant with her Norvasc 5 mg daily, hydrochlorothiazide 12.5 mg.  She is not on a statin and does not really wish to be on 1 but does have known hyperlipidemia.  No chest pain, shortness of breath, visual disturbance.  Overall she is been feeling relatively well  2.  Left hip pain Patient reports some left hip irritation.  She has history of hip repair on the right.  She notes that abduction of the left hip is really what causes little irritation.  She just wanted to inform you of this.  Not currently taking or doing anything for this as it is not severe.  She is ambulating independently.  3.  Stress Patient reports that caregiver stress has gotten better.  Her husband is doing well from a GI standpoint.  He is sleeping more soundly as well.  She is more accepting of his mood swings.   ROS: Per HPI  No Known Allergies Past Medical History:  Diagnosis Date  . Arthritis    Per medical history form dated 11/04/09.past history DJD back  . Enlarged thyroid    "was told had nodules" - no swallowing problems  . Female bladder prolapse   . GERD (gastroesophageal reflux disease) 05/19/2013  . Malignant neoplasm of breast (female), unspecified site    right breast cancer. surgery, radiation, oral meds- Arimidex.  . Nontoxic multinodular goiter 11/10/2015  . Other and unspecified hyperlipidemia   . Rectal prolapse    stool softeners helpful.  [name] Unspecified essential hypertension     Current Outpatient Medications:  .  amLODipine (NORVASC) 5 MG tablet, TAKE 1 TABLET BY MOUTH EVERY DAY, Disp: 90 tablet, Rfl: 1 .  ARTIFICIAL TEAR OP, Apply to eye., Disp: , Rfl:  .  Calcium Carb-Cholecalciferol (CALCIUM + D3) 600-200 MG-UNIT TABS, Take 1 tablet by mouth daily., Disp: , Rfl:  .   hydrochlorothiazide (MICROZIDE) 12.5 MG capsule, Take 1 capsule (12.5 mg total) by mouth daily., Disp: 90 capsule, Rfl: 0 .  Multiple Vitamin (MULTIVITAMIN WITH MINERALS) TABS tablet, Take 1 tablet by mouth daily., Disp: , Rfl:  Social History   Socioeconomic History  . Marital status: Married    Spouse name: Not on file  . Number of children: 2  . Years of education: Not on file  . Highest education level: Not on file  Occupational History  . Occupation: Retired    Comment: Control and instrumentation engineer  Tobacco Use  . Smoking status: Never Smoker  . Smokeless tobacco: Never Used  Vaping Use  . Vaping Use: Never used  Substance and Sexual Activity  . Alcohol use: No  . Drug use: No  . Sexual activity: Not Currently  Other Topics Concern  . Not on file  Social History Narrative  . Not on file   Social Determinants of Health   Financial Resource Strain: Not on file  Food Insecurity: Not on file  Transportation Needs: Not on file  Physical Activity: Not on file  Stress: Not on file  Social Connections: Not on file  Intimate Partner Violence: Not on file   Family History  Problem Relation Age of Onset  . Coronary artery disease Mother 23        CABG age 48  . Hyperlipidemia Mother   . Stroke Mother   . Alcohol abuse Father   .  Hypertension Father   . Hypertension Sister   . Hypertension Brother   . Arthritis Brother   . Anesthesia problems Neg Hx   . Hypotension Neg Hx   . Malignant hyperthermia Neg Hx   . Pseudochol deficiency Neg Hx     Objective: Office vital signs reviewed. BP 139/82   Pulse 68   Temp (!) 97 F (36.1 C) (Temporal)   Ht 5' 4"  (1.626 m)   Wt 159 lb (72.1 kg)   SpO2 97%   BMI 27.29 kg/m   Physical Examination:  General: Awake, alert, well nourished, No acute distress HEENT: Normal, MMM Cardio: regular rate and rhythm, S1S2 heard, no murmurs appreciated Pulm: clear to auscultation bilaterally, no wheezes, rhonchi or rales; normal work of  breathing on room air Extremities: warm, well perfused, No edema, cyanosis or clubbing; +2 pulses bilaterally MSK: normal gait and station Psych: Mood stable, speech normal  Depression screen [facility] 2/9 04/20/2020 01/07/2020 10/20/2019  Decreased Interest 0 1 0  Down, Depressed, Hopeless 0 1 0  PHQ - 2 Score 0 2 0  Altered sleeping 0 0 0  Tired, decreased energy 0 1 0  Change in appetite 0 0 0  Feeling bad or failure about yourself  0 0 0  Trouble concentrating 0 0 0  Moving slowly or fidgety/restless 0 0 0  Suicidal thoughts 0 0 0  PHQ-9 Score 0 3 0  Difficult doing work/chores - Somewhat difficult -    Assessment/ Plan: 79 y.o. female   Essential hypertension - Plan: CMP14+EGFR  Pure hypercholesterolemia - Plan: CMP14+EGFR, Lipid Panel, TSH  Left hip pain - Plan: DG Hip Unilat W OR W/O Pelvis 2-3 Views Left  Caregiver stress  History of breast cancer - Plan: CBC with Differential  Well-controlled blood pressure.  Check fasting labs.  Left hip pain is mild.  I placed an order for an x-ray given history of surgical intervention in the past.  She can come in at her convenience if needed for this  Stress is getting better.  No further interventions needed at this time.  Follow-up in 3 months for recheck  No orders of the defined types were placed in this encounter.  No orders of the defined types were placed in this encounter.    [name], DO [place] [phone]

## 2020-04-21 LAB — CBC WITH DIFFERENTIAL/PLATELET
Basophils Absolute: 0.1 10*3/uL (ref 0.0–0.2)
Basos: 1 %
EOS (ABSOLUTE): 0.3 10*3/uL (ref 0.0–0.4)
Eos: 4 %
Hematocrit: 42.8 % (ref 34.0–46.6)
Hemoglobin: 14.5 g/dL (ref 11.1–15.9)
Immature Grans (Abs): 0 10*3/uL (ref 0.0–0.1)
Immature Granulocytes: 0 %
Lymphocytes Absolute: 1.6 10*3/uL (ref 0.7–3.1)
Lymphs: 24 %
MCH: 29.8 pg (ref 26.6–33.0)
MCHC: 33.9 g/dL (ref 31.5–35.7)
MCV: 88 fL (ref 79–97)
Monocytes Absolute: 0.7 10*3/uL (ref 0.1–0.9)
Monocytes: 10 %
Neutrophils Absolute: 4 10*3/uL (ref 1.4–7.0)
Neutrophils: 61 %
Platelets: 308 10*3/uL (ref 150–450)
RBC: 4.86 x10E6/uL (ref 3.77–5.28)
RDW: 12.7 % (ref 11.7–15.4)
WBC: 6.6 10*3/uL (ref 3.4–10.8)

## 2020-04-21 LAB — CMP14+EGFR
ALT: 14 IU/L (ref 0–32)
AST: 29 IU/L (ref 0–40)
Albumin/Globulin Ratio: 1.7 (ref 1.2–2.2)
Albumin: 4.5 g/dL (ref 3.7–4.7)
Alkaline Phosphatase: 99 IU/L (ref 44–121)
BUN/Creatinine Ratio: 20 (ref 12–28)
BUN: 15 mg/dL (ref 8–27)
Bilirubin Total: 0.4 mg/dL (ref 0.0–1.2)
CO2: 24 mmol/L (ref 20–29)
Calcium: 9.8 mg/dL (ref 8.7–10.3)
Chloride: 104 mmol/L (ref 96–106)
Creatinine, Ser: 0.75 mg/dL (ref 0.57–1.00)
GFR calc Af Amer: 87 mL/min/{1.73_m2} (ref >59)
GFR calc non Af Amer: 76 mL/min/{1.73_m2} (ref >59)
Globulin, Total: 2.6 g/dL (ref 1.5–4.5)
Glucose: 98 mg/dL (ref 65–99)
Potassium: 4 mmol/L (ref 3.5–5.2)
Sodium: 142 mmol/L (ref 134–144)
Total Protein: 7.1 g/dL (ref 6.0–8.5)

## 2020-04-21 LAB — LIPID PANEL
Chol/HDL Ratio: 2.7 ratio (ref 0.0–4.4)
Cholesterol, Total: 223 mg/dL — ABNORMAL HIGH (ref 100–199)
HDL: 82 mg/dL (ref >39)
LDL Chol Calc (NIH): 131 mg/dL — ABNORMAL HIGH (ref 0–99)
Triglycerides: 59 mg/dL (ref 0–149)
VLDL Cholesterol Cal: 10 mg/dL (ref 5–40)

## 2020-04-21 LAB — TSH: TSH: 0.819 u[IU]/mL (ref 0.450–4.500)

## 2020-04-28 ENCOUNTER — Other Ambulatory Visit: Payer: Self-pay | Admitting: Family Medicine

## 2020-04-28 DIAGNOSIS — I10 Essential (primary) hypertension: Secondary | ICD-10-CM

## 2020-05-04 ENCOUNTER — Other Ambulatory Visit: Payer: Self-pay | Admitting: Family Medicine

## 2020-05-04 DIAGNOSIS — I10 Essential (primary) hypertension: Secondary | ICD-10-CM

## 2020-05-23 ENCOUNTER — Telehealth: Payer: Medicare Other

## 2020-06-17 ENCOUNTER — Ambulatory Visit (INDEPENDENT_AMBULATORY_CARE_PROVIDER_SITE_OTHER): Payer: Medicare Other

## 2020-06-17 DIAGNOSIS — M1612 Unilateral primary osteoarthritis, left hip: Secondary | ICD-10-CM | POA: Diagnosis not present

## 2020-06-17 DIAGNOSIS — M25552 Pain in left hip: Secondary | ICD-10-CM | POA: Diagnosis not present

## 2020-06-22 ENCOUNTER — Ambulatory Visit (INDEPENDENT_AMBULATORY_CARE_PROVIDER_SITE_OTHER): Payer: Medicare Other | Admitting: Family Medicine

## 2020-06-22 ENCOUNTER — Encounter: Payer: Self-pay | Admitting: Family Medicine

## 2020-06-22 VITALS — BP 133/83 | HR 65 | Temp 97.9°F | Ht 64.0 in | Wt 157.0 lb

## 2020-06-22 DIAGNOSIS — H00025 Hordeolum internum left lower eyelid: Secondary | ICD-10-CM

## 2020-06-22 MED ORDER — POLYMYXIN B-TRIMETHOPRIM 10000-0.1 UNIT/ML-% OP SOLN: 1.0000 [drp] | Freq: Four times a day (QID) | OPHTHALMIC | 0 refills | Status: AC

## 2020-06-22 NOTE — Progress Notes (Signed)
Subjective: CC: Stye PCP: [name], DO Ashley Holland is a 80 y.o. female presenting to clinic today for:  1.  Stye Patient has had a stye for about 24 hours now.  She has been using warm compresses in efforts to improve.  She reports some itching but no significant discomfort.  No pain with eye movement.  No visual disturbance.   ROS: Per HPI  No Known Allergies Past Medical History:  Diagnosis Date  . Arthritis    Per medical history form dated 11/04/09.past history DJD back  . Enlarged thyroid    "was told had nodules" - no swallowing problems  . Female bladder prolapse   . GERD (gastroesophageal reflux disease) 05/19/2013  . Malignant neoplasm of breast (female), unspecified site    right breast cancer. surgery, radiation, oral meds- Arimidex.  . Nontoxic multinodular goiter 11/10/2015  . Other and unspecified hyperlipidemia   . Rectal prolapse    stool softeners helpful.  [name] Unspecified essential hypertension     Current Outpatient Medications:  .  trimethoprim-polymyxin b (POLYTRIM) ophthalmic solution, Place 1 drop into the left eye every 6 (six) hours for 7 days., Disp: 10 mL, Rfl: 0 .  amLODipine (NORVASC) 5 MG tablet, TAKE 1 TABLET BY MOUTH EVERY DAY, Disp: 90 tablet, Rfl: 1 .  ARTIFICIAL TEAR OP, Apply to eye., Disp: , Rfl:  .  Calcium Carb-Cholecalciferol (CALCIUM + D3) 600-200 MG-UNIT TABS, Take 1 tablet by mouth daily., Disp: , Rfl:  .  hydrochlorothiazide (MICROZIDE) 12.5 MG capsule, TAKE 1 CAPSULE BY MOUTH EVERY DAY, Disp: 90 capsule, Rfl: 1 .  Multiple Vitamin (MULTIVITAMIN WITH MINERALS) TABS tablet, Take 1 tablet by mouth daily., Disp: , Rfl:  Social History   Socioeconomic History  . Marital status: Married    Spouse name: Not on file  . Number of children: 2  . Years of education: Not on file  . Highest education level: Not on file  Occupational History  . Occupation: Retired    Comment: Control and instrumentation engineer  Tobacco Use  . Smoking  status: Never Smoker  . Smokeless tobacco: Never Used  Vaping Use  . Vaping Use: Never used  Substance and Sexual Activity  . Alcohol use: No  . Drug use: No  . Sexual activity: Not Currently  Other Topics Concern  . Not on file  Social History Narrative  . Not on file   Social Determinants of Health   Financial Resource Strain: Not on file  Food Insecurity: Not on file  Transportation Needs: Not on file  Physical Activity: Not on file  Stress: Not on file  Social Connections: Not on file  Intimate Partner Violence: Not on file   Family History  Problem Relation Age of Onset  . Coronary artery disease Mother 42        CABG age 77  . Hyperlipidemia Mother   . Stroke Mother   . Alcohol abuse Father   . Hypertension Father   . Hypertension Sister   . Hypertension Brother   . Arthritis Brother   . Anesthesia problems Neg Hx   . Hypotension Neg Hx   . Malignant hyperthermia Neg Hx   . Pseudochol deficiency Neg Hx     Objective: Office vital signs reviewed. BP 133/83   Pulse 65   Temp 97.9 F (36.6 C) (Temporal)   Ht 5\' 4"  (1.626 m)   Wt 157 lb (71.2 kg)   SpO2 98%   BMI 26.95 kg/m  Physical Examination:  General: Awake, alert, well nourished, No acute distress HEENT: Left lower lid with pea-sized chalazion.  There is associated erythema and localized soft tissue swelling.  There is mild purulent drainage noted at the corner of her eye.  She has mild conjunctival injection. EOMI, PERRL  Assessment/ Plan: 80 y.o. female   Hordeolum internum of left lower eyelid - Plan: trimethoprim-polymyxin b (POLYTRIM) ophthalmic solution  Chalazion noted along the left inner lower lid.  There is some purulent drainage which gives me concern for infection therefore I placed her on trimethoprim.  No evidence of septal or preseptal cellulitis.  We discussed red flag signs and symptoms.  She will follow-up as needed.  No orders of the defined types were placed in this  encounter.  Meds ordered this encounter  Medications  . trimethoprim-polymyxin b (POLYTRIM) ophthalmic solution    Sig: Place 1 drop into the left eye every 6 (six) hours for 7 days.    Dispense:  10 mL    Refill:  [place], DO [place] [phone]

## 2020-06-23 ENCOUNTER — Ambulatory Visit (INDEPENDENT_AMBULATORY_CARE_PROVIDER_SITE_OTHER): Payer: Medicare Other | Admitting: Licensed Clinical Social Worker

## 2020-06-23 DIAGNOSIS — I10 Essential (primary) hypertension: Secondary | ICD-10-CM

## 2020-06-23 DIAGNOSIS — M1611 Unilateral primary osteoarthritis, right hip: Secondary | ICD-10-CM

## 2020-06-23 DIAGNOSIS — D508 Other iron deficiency anemias: Secondary | ICD-10-CM | POA: Diagnosis not present

## 2020-06-23 DIAGNOSIS — E7800 Pure hypercholesterolemia, unspecified: Secondary | ICD-10-CM

## 2020-06-23 NOTE — Patient Instructions (Addendum)
Licensed Clinical Social Worker Visit Information  Goals we discussed today:   .  Client will talk with LCSW in next 30 days about managing stress in caring for needs of her spouse (pt-stated)        CARE PLAN ENTRY   Current Barriers:   Patient with Chronic diagnoses of Anemia, OA, Malignant neoplasm of right breast,HTN, HLD  Clinical Social Work Clinical Goal(s):   LCSW will call client in next 30 days to talk with her about managing stress in caring for needs of her spouse  Interventions:  Talked with client about CCM program support  Talked with client about needs of her spouse  Talked with client  about pain issues of client  Talked with client  about medication procurement for client  Talked with client about ADLs completion of client  Talked with client about transport needs (she drives car as needed)  Talked with client about mobility issues of client  Talked with client about vision of client (she has new glasses)  Talked with client about her upcoming medical appointments of client  Talked with client about relaxation techniques (does crossword puzzles, likes to walk, active at church, plays scrabble with friends)  Talked with client about food provision and meal provision of client  Provided counseling support for client  Talked with client about resource for Alzheimer's caregivers ([facility])  Talked with client about energy level of client  Encouraged client to call RNCM as needed for nursing support  Talked with client about sleeping issues of client   Talked with client about mood of client  Patient Self Care Activities:   Does ADLs independently Drives car as needed to appointments and to complete errands  Patient Self Care Deficits:   Managing stress issues at home is challenging  Caring for needs of her spouse is challenging  Initial goal documentation     Follow Up Plan:LCSW will call  client in next 4 weeks to talk with client about managing stress in caring for the needs of her spouse  Materials Provided: No  The patient verbalized understanding of instructions provided today and declined a print copy of patient instruction materials.   [name].Lejend Dalby MSW, LCSW Licensed Clinical Social Worker [facility] Care Management [phone]

## 2020-06-23 NOTE — Chronic Care Management (AMB) (Signed)
Chronic Care Management    Clinical Social Work Follow Up Note  06/23/2020 Name: Ashley Holland MRN: [id number] DOB: 06/09/1940  Ashley Holland is a 80 y.o. year old female who is a primary care patient of Ashley Norlander, DO. The CCM team was consulted for assistance with [employer] .   Review of patient status, including review of consultants reports, other relevant assessments, and collaboration with appropriate care team members and the patient's provider was performed as part of comprehensive patient evaluation and provision of chronic care management services.    SDOH (Social Determinants of Health) assessments performed: No; risk for depression; risk for tobacco use; risk for stress; risk for physical inactivity  [facility] Office Visit from 06/22/2020 in [facility]  PHQ-9 Total Score 0      GAD 7 : Generalized Anxiety Score 01/07/2020  Nervous, Anxious, on Edge 0  Control/stop worrying 0  Worry too much - different things 0  Trouble relaxing 0  Restless 0  Easily annoyed or irritable 0  Afraid - awful might happen 0  Total GAD 7 Score 0  Anxiety Difficulty Somewhat difficult    Outpatient Encounter Medications as of 06/23/2020  Medication Sig  . amLODipine (NORVASC) 5 MG tablet TAKE 1 TABLET BY MOUTH EVERY DAY  . ARTIFICIAL TEAR OP Apply to eye.  . Calcium Carb-Cholecalciferol (CALCIUM + D3) 600-200 MG-UNIT TABS Take 1 tablet by mouth daily.  . hydrochlorothiazide (MICROZIDE) 12.5 MG capsule TAKE 1 CAPSULE BY MOUTH EVERY DAY  . Multiple Vitamin (MULTIVITAMIN WITH MINERALS) TABS tablet Take 1 tablet by mouth daily.  [name] trimethoprim-polymyxin b (POLYTRIM) ophthalmic solution Place 1 drop into the left eye every 6 (six) hours for 7 days.   No facility-administered encounter medications on file as of 06/23/2020.    Goals    .  Client will talk with LCSW in next 30 days about managing stress in caring for needs of her spouse (pt-stated)       CARE PLAN ENTRY   Current Barriers:  . Patient with Chronic diagnoses of Anemia, OA, Malignant neoplasm of right breast,HTN, HLD  Clinical Social Work Clinical Goal(s):  [name] LCSW will call client in next 30 days to talk with her about managing stress in caring for needs of her spouse  Interventions: . Talked with client about CCM program support . Talked with client about needs of her spouse . Talked with client  about pain issues of client . Talked with client  about medication procurement for client . Talked with client about ADLs completion of client . Talked with client about transport needs (she drives car as needed) . Talked with client about mobility issues of client . Talked with client about vision of client (she has new glasses) . Talked with client about her upcoming medical appointments of client . Talked with client about relaxation techniques (does crossword puzzles, likes to walk, active at church, plays scrabble with friends) . Talked with client about food provision and meal provision of client . Provided counseling support for client . Talked with client about resource for Alzheimer's caregivers ([employer] [facility]) . Talked with client about energy level of client . Encouraged client to call RNCM as needed for nursing support . Talked with client about sleeping issues of client  . Talked with client about mood of client  Patient Self Care Activities:   Does ADLs independently Drives car as needed to appointments and to complete errands  Patient  Self Care Deficits:  [name] Managing stress issues at home is challenging . Caring for needs of her spouse is challenging  Initial goal documentation     Follow Up Plan: LCSW will call client in next 4 weeks to talk with client about managing stress in caring for the needs of her spouse  [name].Aryon Nham MSW, LCSW Licensed Clinical Social Worker [facility] Care  Management [phone]

## 2020-07-06 DIAGNOSIS — M25552 Pain in left hip: Secondary | ICD-10-CM | POA: Insufficient documentation

## 2020-07-08 DIAGNOSIS — M1612 Unilateral primary osteoarthritis, left hip: Secondary | ICD-10-CM | POA: Diagnosis not present

## 2020-07-19 ENCOUNTER — Ambulatory Visit (INDEPENDENT_AMBULATORY_CARE_PROVIDER_SITE_OTHER): Payer: Medicare Other | Admitting: Family Medicine

## 2020-07-19 ENCOUNTER — Other Ambulatory Visit: Payer: Self-pay

## 2020-07-19 ENCOUNTER — Encounter: Payer: Self-pay | Admitting: Family Medicine

## 2020-07-19 VITALS — BP 132/71 | HR 61 | Temp 98.5°F | Ht 64.0 in | Wt 161.4 lb

## 2020-07-19 DIAGNOSIS — I10 Essential (primary) hypertension: Secondary | ICD-10-CM | POA: Diagnosis not present

## 2020-07-19 DIAGNOSIS — M25552 Pain in left hip: Secondary | ICD-10-CM | POA: Diagnosis not present

## 2020-07-19 NOTE — Progress Notes (Signed)
Subjective: CC: HTN PCP: [name], DO [facility]:CWCBJSE UNITA DETAMORE is a 80 y.o. female presenting to clinic today for:  1. HTN Patient compliant with Norvasc and hydrochlorothiazide.  No chest pain, shortness of breath, edema or visual disturbance.  Her left eye seems to be healing well.  She wears glasses.  2.  Left hip pain Patient to undergo left hip surgery this summer.  She continues to have an antalgic gait with hills and when she is shopping due to pivoting.  Otherwise if she is on flat land she does well.  She did have a mechanical fall a few months ago but did not injure herself.   ROS: Per HPI  No Known Allergies Past Medical History:  Diagnosis Date  . Arthritis    Per medical history form dated 11/04/09.past history DJD back  . Enlarged thyroid    "was told had nodules" - no swallowing problems  . Female bladder prolapse   . GERD (gastroesophageal reflux disease) 05/19/2013  . Malignant neoplasm of breast (female), unspecified site    right breast cancer. surgery, radiation, oral meds- Arimidex.  . Nontoxic multinodular goiter 11/10/2015  . Other and unspecified hyperlipidemia   . Rectal prolapse    stool softeners helpful.  [name] Unspecified essential hypertension     Current Outpatient Medications:  .  amLODipine (NORVASC) 5 MG tablet, TAKE 1 TABLET BY MOUTH EVERY DAY, Disp: 90 tablet, Rfl: 1 .  ARTIFICIAL TEAR OP, Apply to eye., Disp: , Rfl:  .  Calcium Carb-Cholecalciferol (CALCIUM + D3) 600-200 MG-UNIT TABS, Take 1 tablet by mouth daily., Disp: , Rfl:  .  hydrochlorothiazide (MICROZIDE) 12.5 MG capsule, TAKE 1 CAPSULE BY MOUTH EVERY DAY, Disp: 90 capsule, Rfl: 1 .  Multiple Vitamin (MULTIVITAMIN WITH MINERALS) TABS tablet, Take 1 tablet by mouth daily., Disp: , Rfl:  Social History   Socioeconomic History  . Marital status: Married    Spouse name: Not on file  . Number of children: 2  . Years of education: Not on file  . Highest education level: Not on  file  Occupational History  . Occupation: Retired    Comment: Control and instrumentation engineer  Tobacco Use  . Smoking status: Never Smoker  . Smokeless tobacco: Never Used  Vaping Use  . Vaping Use: Never used  Substance and Sexual Activity  . Alcohol use: No  . Drug use: No  . Sexual activity: Not Currently  Other Topics Concern  . Not on file  Social History Narrative  . Not on file   Social Determinants of Health   Financial Resource Strain: Not on file  Food Insecurity: Not on file  Transportation Needs: Not on file  Physical Activity: Not on file  Stress: Not on file  Social Connections: Not on file  Intimate Partner Violence: Not on file   Family History  Problem Relation Age of Onset  . Coronary artery disease Mother 51        CABG age 15  . Hyperlipidemia Mother   . Stroke Mother   . Alcohol abuse Father   . Hypertension Father   . Hypertension Sister   . Hypertension Brother   . Arthritis Brother   . Anesthesia problems Neg Hx   . Hypotension Neg Hx   . Malignant hyperthermia Neg Hx   . Pseudochol deficiency Neg Hx     Objective: Office vital signs reviewed. BP 132/71   Pulse 61   Temp 98.5 F (36.9 C) (Temporal)   Ht  5\' 4"  (1.626 m)   Wt 161 lb 6.4 oz (73.2 kg)   SpO2 98%   BMI 27.70 kg/m   Physical Examination:  General: Awake, alert, well nourished, No acute distress HEENT: Normal, sclera white Cardio: regular rate and rhythm, S1S2 heard, no murmurs appreciated Pulm: clear to auscultation bilaterally, no wheezes, rhonchi or rales; normal work of breathing on room air Extremities: warm, well perfused, No edema, cyanosis or clubbing; +2 pulses bilaterally   Assessment/ Plan: 80 y.o. female   Primary hypertension  Left hip pain  Blood pressure under excellent control.  Overall she is doing well.  We will see each other back at the end of May/beginning of June for preop clearance.  Plan for EKG, labs and potentially urinalysis.  No orders of the  defined types were placed in this encounter.  No orders of the defined types were placed in this encounter.    [name], DO [place] [phone]

## 2020-07-21 ENCOUNTER — Telehealth: Payer: Medicare Other

## 2020-08-03 ENCOUNTER — Telehealth: Payer: Medicare Other

## 2020-08-31 ENCOUNTER — Ambulatory Visit (INDEPENDENT_AMBULATORY_CARE_PROVIDER_SITE_OTHER): Payer: Medicare Other | Admitting: Family Medicine

## 2020-08-31 ENCOUNTER — Encounter: Payer: Self-pay | Admitting: Family Medicine

## 2020-08-31 ENCOUNTER — Other Ambulatory Visit: Payer: Self-pay

## 2020-08-31 VITALS — BP 152/79 | HR 59 | Temp 97.5°F | Ht 64.0 in | Wt 157.0 lb

## 2020-08-31 DIAGNOSIS — I10 Essential (primary) hypertension: Secondary | ICD-10-CM | POA: Diagnosis not present

## 2020-08-31 DIAGNOSIS — M1612 Unilateral primary osteoarthritis, left hip: Secondary | ICD-10-CM | POA: Diagnosis not present

## 2020-08-31 DIAGNOSIS — Z01818 Encounter for other preprocedural examination: Secondary | ICD-10-CM

## 2020-08-31 NOTE — Progress Notes (Signed)
Pt is a 80 y.o. female who is here for preoperative clearance for Left hip surgery scheduled for October 12, 2020.  She has had previous hip surgery on the right and had no postop complications.  She is never had issues coming out of anesthesia in fact thinks that they will be performing a spinal for this surgery.  She has preop labs scheduled for 2 weeks prior to the operation.  Denies any chest pain, shortness of breath, difficulty bending neck.  She is ambulating independently.  She will have her daughter and granddaughter assisting after surgery.  1) High Risk Cardiac Conditions  1) Recent MI - No.  2) Decompensated Heart Failure - No.  3) Unstable angina - No.  4) Symptomatic arrythmia - No.  5) Sx Valvular Disease - No.  2) Intermediate Risk Factors - DM, CKD, CVA, CHF, CAD - No.  2) Functional Status - > 4 mets (Walk, run, climb stairs) Yes.  [name]    3) Surgery Specific Risk - Intermediate (Carotid, Head and Neck, Orthopaedic )  4) Further Noninvasive evaluation -   1) EKG - Yes.     1) Hx of HTN  2) Echo - No.   1) Worsening dyspnea   3) Stress Testing - Active Cardiac Disease - No.  5) Need for medical therapy - Beta Blocker, Statins indicated ? No.  PE: Vitals with BMI 08/31/2020 08/31/2020 08/31/2020  Height - - 5\' 4"   Weight - - 157 lbs  BMI - - 27.25  Systolic 366 440 347  Diastolic 79 85 86  Pulse - - 59     Physical Examination: General appearance - alert, well appearing, and in no distress Mental status - alert, oriented to person, place, and time Eyes - sclera anicteric Mouth - mucous membranes moist, pharynx normal without lesions and Mallampati 1 Neck - supple, no significant adenopathy Chest - clear to auscultation, no wheezes, rales or rhonchi, symmetric air entry Heart - normal rate, regular rhythm, normal S1, S2, no murmurs, rubs, clicks or gallops Abdomen - soft, nontender, nondistended, no masses or organomegaly Musculoskeletal - no joint tenderness,  deformity or swelling  A/P:  Pre-op examination - Plan: EKG 12-Lead  Primary osteoarthritis of left hip  Primary hypertension  I have independently evaluated patient.  RICHA SHOR is a 80 y.o. female who is low risk for an intermediate risk surgery.  There are not modifiable risk factors (smoking, etc).   Kemaya J Winer's RCRI calculation for MACE is: 0 (0.3%).    EKG without evidence of ischemia or arrhythmia.  Copy provided to the patient.  Repeat blood pressure was still slightly elevated.  Would like her to monitor BP at home.  If <150/90 consistently, recommend advancing her Norvasc to 10mg  daily.  Danicka Hourihan M. Lajuana Ripple, [facility]

## 2020-09-07 ENCOUNTER — Telehealth: Payer: Medicare Other

## 2020-09-29 NOTE — Patient Instructions (Addendum)
DUE TO COVID-19 ONLY ONE VISITOR IS ALLOWED TO COME WITH YOU AND STAY IN THE WAITING ROOM ONLY DURING PRE OP AND PROCEDURE DAY OF SURGERY. THE 1 VISITOR  MAY VISIT WITH YOU AFTER SURGERY IN YOUR PRIVATE ROOM DURING VISITING HOURS ONLY!  YOU NEED TO HAVE A COVID 19 TEST ON: 10/10/20 @ 2:30 PM , THIS TEST MUST BE DONE BEFORE SURGERY,  COVID TESTING SITE [place] [address], IT IS ON THE RIGHT GOING OUT [address] APPROXIMATELY  2 MINUTES PAST [employer] ON THE RIGHT. ONCE YOUR COVID TEST IS COMPLETED,  PLEASE BEGIN THE QUARANTINE INSTRUCTIONS AS OUTLINED IN YOUR HANDOUT.                Ashley Holland   Your procedure is scheduled on: 10/12/20   Report to [facility] Main  Entrance   Report to admitting at : 7:30 AM     Call this number if you have problems the morning of surgery [phone]    Remember: NO SOLID FOOD AFTER MIDNIGHT THE NIGHT PRIOR TO SURGERY. NOTHING BY MOUTH EXCEPT CLEAR LIQUIDS UNTIL : 7:00 AM. PLEASE FINISH ENSURE DRINK PER SURGEON ORDER  WHICH NEEDS TO BE COMPLETED AT : 7:00 AM.   CLEAR LIQUID DIET  Foods Allowed                                                                     Foods Excluded  Coffee and tea, regular and decaf                             liquids that you cannot  Plain Jell-O any favor except red or purple                                           see through such as: Fruit ices (not with fruit pulp)                                     milk, soups, orange juice  Iced Popsicles                                    All solid food Carbonated beverages, regular and diet                                    Cranberry, grape and apple juices Sports drinks like Gatorade Lightly seasoned clear broth or consume(fat free) Sugar, honey syrup  Sample Menu Breakfast                                Lunch  Supper Cranberry juice                    Beef broth                             Chicken broth Jell-O                                     Grape juice                           Apple juice Coffee or tea                        Jell-O                                      Popsicle                                                Coffee or tea                        Coffee or tea  _____________________________________________________________________   BRUSH YOUR TEETH MORNING OF SURGERY AND RINSE YOUR MOUTH OUT, NO CHEWING GUM CANDY OR MINTS.    Take these medicines the morning of surgery with A SIP OF WATER: amlodipine.                               You may not have any metal on your body including hair pins and              piercings  Do not wear jewelry, make-up, lotions, powders or perfumes, deodorant             Do not wear nail polish on your fingernails.  Do not shave  48 hours prior to surgery.    Do not bring valuables to the hospital. [name].  Contacts, dentures or bridgework may not be worn into surgery.  Leave suitcase in the car. After surgery it may be brought to your room.     Patients discharged the day of surgery will not be allowed to drive home. IF YOU ARE HAVING SURGERY AND GOING HOME THE SAME DAY, YOU MUST HAVE AN ADULT TO DRIVE YOU HOME AND BE WITH YOU FOR 24 HOURS. YOU MAY GO HOME BY TAXI OR UBER OR ORTHERWISE, BUT AN ADULT MUST ACCOMPANY YOU HOME AND STAY WITH YOU FOR 24 HOURS.  Name and phone number of your driver:  Special Instructions: N/A              Please read over the following fact sheets you were given: _____________________________________________________________________           Ashley Holland - Preparing for Surgery Before surgery, you can play an important role.  Because skin is not sterile, your skin needs to be as free of germs as possible.  You can reduce the number of germs on your skin by washing with CHG (chlorahexidine gluconate) soap before surgery.  CHG is an antiseptic cleaner  which kills germs and bonds with the skin to continue killing germs even after washing. Please DO NOT use if you have an allergy to CHG or antibacterial soaps.  If your skin becomes reddened/irritated stop using the CHG and inform your nurse when you arrive at Short Stay. Do not shave (including legs and underarms) for at least 48 hours prior to the first CHG shower.  You may shave your face/neck. Please follow these instructions carefully:  1.  Shower with CHG Soap the night before surgery and the  morning of Surgery.  2.  If you choose to wash your hair, wash your hair first as usual with your  normal  shampoo.  3.  After you shampoo, rinse your hair and body thoroughly to remove the  shampoo.                           4.  Use CHG as you would any other liquid soap.  You can apply chg directly  to the skin and wash                       Gently with a scrungie or clean washcloth.  5.  Apply the CHG Soap to your body ONLY FROM THE NECK DOWN.   Do not use on face/ open                           Wound or open sores. Avoid contact with eyes, ears mouth and genitals (private parts).                       Wash face,  Genitals (private parts) with your normal soap.             6.  Wash thoroughly, paying special attention to the area where your surgery  will be performed.  7.  Thoroughly rinse your body with warm water from the neck down.  8.  DO NOT shower/wash with your normal soap after using and rinsing off  the CHG Soap.                9.  Pat yourself dry with a clean towel.            10.  Wear clean pajamas.            11.  Place clean sheets on your bed the night of your first shower and do not  sleep with pets. Day of Surgery : Do not apply any lotions/deodorants the morning of surgery.  Please wear clean clothes to the hospital/surgery center.  FAILURE TO FOLLOW THESE INSTRUCTIONS MAY RESULT IN THE CANCELLATION OF YOUR SURGERY PATIENT SIGNATURE_________________________________  NURSE  SIGNATURE__________________________________  ________________________________________________________________________   Ashley Holland  An incentive spirometer is a tool that can help keep your lungs clear and active. This tool measures how well you are filling your lungs with each breath. Taking long deep breaths may help reverse or decrease the chance of developing breathing (pulmonary) problems (especially infection) following: A long period of time when you are unable to move or be active. BEFORE THE PROCEDURE  If the spirometer includes an indicator to show your best effort, your nurse or respiratory therapist will set it  to a desired goal. If possible, sit up straight or lean slightly forward. Try not to slouch. Hold the incentive spirometer in an upright position. INSTRUCTIONS FOR USE  Sit on the edge of your bed if possible, or sit up as far as you can in bed or on a chair. Hold the incentive spirometer in an upright position. Breathe out normally. Place the mouthpiece in your mouth and seal your lips tightly around it. Breathe in slowly and as deeply as possible, raising the piston or the ball toward the top of the column. Hold your breath for 3-5 seconds or for as long as possible. Allow the piston or ball to fall to the bottom of the column. Remove the mouthpiece from your mouth and breathe out normally. Rest for a few seconds and repeat Steps 1 through 7 at least 10 times every 1-2 hours when you are awake. Take your time and take a few normal breaths between deep breaths. The spirometer may include an indicator to show your best effort. Use the indicator as a goal to work toward during each repetition. After each set of 10 deep breaths, practice coughing to be sure your lungs are clear. If you have an incision (the cut made at the time of surgery), support your incision when coughing by placing a pillow or rolled up towels firmly against it. Once you are able to get out of  bed, walk around indoors and cough well. You may stop using the incentive spirometer when instructed by your caregiver.  RISKS AND COMPLICATIONS Take your time so you do not get dizzy or light-headed. If you are in pain, you may need to take or ask for pain medication before doing incentive spirometry. It is harder to take a deep breath if you are having pain. AFTER USE Rest and breathe slowly and easily. It can be helpful to keep track of a log of your progress. Your caregiver can provide you with a simple table to help with this. If you are using the spirometer at home, follow these instructions: [place] IF:  You are having difficultly using the spirometer. You have trouble using the spirometer as often as instructed. Your pain medication is not giving enough relief while using the spirometer. You develop fever of 100.5 F (38.1 C) or higher. SEEK IMMEDIATE MEDICAL CARE IF:  You cough up bloody sputum that had not been present before. You develop fever of 102 F (38.9 C) or greater. You develop worsening pain at or near the incision site. MAKE SURE YOU:  Understand these instructions. Will watch your condition. Will get help right away if you are not doing well or get worse. Document Released: 08/20/2006 Document Revised: 07/02/2011 Document Reviewed: 10/21/2006 [facility] Patient Information 2014 [place], [place].   ________________________________________________________________________

## 2020-09-30 ENCOUNTER — Other Ambulatory Visit: Payer: Self-pay

## 2020-09-30 ENCOUNTER — Encounter (HOSPITAL_COMMUNITY)
Admission: RE | Admit: 2020-09-30 | Discharge: 2020-09-30 | Disposition: A | Discharge status: Home / Self Care | Payer: Medicare Other | Source: Ambulatory Visit | Attending: Orthopedic Surgery | Admitting: Orthopedic Surgery

## 2020-09-30 ENCOUNTER — Encounter (HOSPITAL_COMMUNITY): Payer: Self-pay

## 2020-09-30 DIAGNOSIS — Z01812 Encounter for preprocedural laboratory examination: Secondary | ICD-10-CM | POA: Insufficient documentation

## 2020-09-30 LAB — COMPREHENSIVE METABOLIC PANEL
ALT: 16 U/L (ref 0–44)
AST: 28 U/L (ref 15–41)
Albumin: 4 g/dL (ref 3.5–5.0)
Alkaline Phosphatase: 81 U/L (ref 38–126)
Anion gap: 10 (ref 5–15)
BUN: 22 mg/dL (ref 8–23)
CO2: 26 mmol/L (ref 22–32)
Calcium: 9.5 mg/dL (ref 8.9–10.3)
Chloride: 105 mmol/L (ref 98–111)
Creatinine, Ser: 0.6 mg/dL (ref 0.44–1.00)
GFR, Estimated: >60 mL/min (ref >60)
Glucose, Bld: 97 mg/dL (ref 70–99)
Potassium: 4.1 mmol/L (ref 3.5–5.1)
Sodium: 141 mmol/L (ref 135–145)
Total Bilirubin: 0.2 mg/dL — ABNORMAL LOW (ref 0.3–1.2)
Total Protein: 7.2 g/dL (ref 6.5–8.1)

## 2020-09-30 LAB — CBC
HCT: 42.2 % (ref 36.0–46.0)
Hemoglobin: 14 g/dL (ref 12.0–15.0)
MCH: 29.5 pg (ref 26.0–34.0)
MCHC: 33.2 g/dL (ref 30.0–36.0)
MCV: 89 fL (ref 80.0–100.0)
Platelets: 302 10*3/uL (ref 150–400)
RBC: 4.74 MIL/uL (ref 3.87–5.11)
RDW: 12.8 % (ref 11.5–15.5)
WBC: 7.2 10*3/uL (ref 4.0–10.5)
nRBC: 0 % (ref 0.0–0.2)

## 2020-09-30 LAB — PROTIME-INR
INR: 1.1 (ref 0.8–1.2)
Prothrombin Time: 13.7 seconds (ref 11.4–15.2)

## 2020-09-30 LAB — TYPE AND SCREEN
ABO/RH(D): O POS
Antibody Screen: NEGATIVE

## 2020-09-30 LAB — APTT: aPTT: 40 seconds — ABNORMAL HIGH (ref 24–36)

## 2020-09-30 LAB — SURGICAL PCR SCREEN
MRSA, PCR: NEGATIVE
Staphylococcus aureus: NEGATIVE

## 2020-09-30 NOTE — Progress Notes (Signed)
COVID Vaccine Completed: Yes Date COVID Vaccine completed: 03/02/20 COVID vaccine manufacturer:  [place]     PCP - DO: Adam Phenix. LOV: 07/19/20. : [facility] Cardiologist -   Chest x-ray -  EKG - 08/31/20. : EPIC Stress Test -  ECHO -  Cardiac Cath -  Pacemaker/ICD device last checked:  Sleep Study -  CPAP -   Fasting Blood Sugar -  Checks Blood Sugar _____ times a day  Blood Thinner Instructions: Aspirin Instructions: Last Dose:  Anesthesia review:   Patient denies shortness of breath, fever, cough and chest pain at PAT appointment   Patient verbalized understanding of instructions that were given to them at the PAT appointment. Patient was also instructed that they will need to review over the PAT instructions again at home before surgery.

## 2020-09-30 NOTE — Progress Notes (Signed)
Sent APTT results to Dr. Wynelle Link.

## 2020-10-10 ENCOUNTER — Other Ambulatory Visit (HOSPITAL_COMMUNITY)
Admission: RE | Admit: 2020-10-10 | Discharge: 2020-10-10 | Disposition: A | Discharge status: Home / Self Care | Payer: Medicare Other | Source: Ambulatory Visit | Attending: Orthopedic Surgery | Admitting: Orthopedic Surgery

## 2020-10-10 DIAGNOSIS — Z01812 Encounter for preprocedural laboratory examination: Secondary | ICD-10-CM | POA: Insufficient documentation

## 2020-10-10 DIAGNOSIS — Z20822 Contact with and (suspected) exposure to covid-19: Secondary | ICD-10-CM | POA: Insufficient documentation

## 2020-10-10 LAB — SARS CORONAVIRUS 2 (TAT 6-24 HRS): SARS Coronavirus 2: NEGATIVE

## 2020-10-12 ENCOUNTER — Observation Stay (HOSPITAL_COMMUNITY): Payer: Medicare Other

## 2020-10-12 ENCOUNTER — Observation Stay (HOSPITAL_COMMUNITY)
Admission: RE | Admit: 2020-10-12 | Discharge: 2020-10-13 | Disposition: A | Discharge status: Home / Self Care | Payer: Medicare Other | Attending: Orthopedic Surgery | Admitting: Orthopedic Surgery

## 2020-10-12 ENCOUNTER — Other Ambulatory Visit: Payer: Self-pay

## 2020-10-12 ENCOUNTER — Ambulatory Visit (HOSPITAL_COMMUNITY): Payer: Medicare Other | Admitting: Certified Registered"

## 2020-10-12 ENCOUNTER — Ambulatory Visit (HOSPITAL_COMMUNITY): Payer: Medicare Other

## 2020-10-12 ENCOUNTER — Encounter (HOSPITAL_COMMUNITY): Payer: Self-pay | Admitting: Orthopedic Surgery

## 2020-10-12 ENCOUNTER — Encounter (HOSPITAL_COMMUNITY)
Admission: RE | Disposition: A | Discharge status: Home / Self Care | Payer: Self-pay | Source: Home / Self Care | Attending: Orthopedic Surgery

## 2020-10-12 DIAGNOSIS — I10 Essential (primary) hypertension: Secondary | ICD-10-CM | POA: Diagnosis not present

## 2020-10-12 DIAGNOSIS — Z96641 Presence of right artificial hip joint: Secondary | ICD-10-CM | POA: Diagnosis not present

## 2020-10-12 DIAGNOSIS — E063 Autoimmune thyroiditis: Secondary | ICD-10-CM | POA: Diagnosis not present

## 2020-10-12 DIAGNOSIS — Z853 Personal history of malignant neoplasm of breast: Secondary | ICD-10-CM | POA: Insufficient documentation

## 2020-10-12 DIAGNOSIS — Z79899 Other long term (current) drug therapy: Secondary | ICD-10-CM | POA: Diagnosis not present

## 2020-10-12 DIAGNOSIS — M1612 Unilateral primary osteoarthritis, left hip: Secondary | ICD-10-CM | POA: Diagnosis not present

## 2020-10-12 DIAGNOSIS — Z419 Encounter for procedure for purposes other than remedying health state, unspecified: Secondary | ICD-10-CM

## 2020-10-12 DIAGNOSIS — E785 Hyperlipidemia, unspecified: Secondary | ICD-10-CM | POA: Diagnosis not present

## 2020-10-12 DIAGNOSIS — Z96649 Presence of unspecified artificial hip joint: Secondary | ICD-10-CM

## 2020-10-12 DIAGNOSIS — Z96642 Presence of left artificial hip joint: Secondary | ICD-10-CM | POA: Diagnosis present

## 2020-10-12 DIAGNOSIS — Z471 Aftercare following joint replacement surgery: Secondary | ICD-10-CM | POA: Diagnosis not present

## 2020-10-12 HISTORY — PX: TOTAL HIP ARTHROPLASTY: SHX124

## 2020-10-12 SURGERY — ARTHROPLASTY, HIP, TOTAL, ANTERIOR APPROACH
Anesthesia: Spinal | Site: Hip | Laterality: Left

## 2020-10-12 MED ORDER — SODIUM CHLORIDE 0.9 % IV SOLN: INTRAVENOUS | Status: DC

## 2020-10-12 MED ORDER — PHENYLEPHRINE 40 MCG/ML (10ML) SYRINGE FOR IV PUSH (FOR BLOOD PRESSURE SUPPORT)
PREFILLED_SYRINGE | INTRAVENOUS | Status: DC | PRN
  Administered 2020-10-12: 120 ug via INTRAVENOUS

## 2020-10-12 MED ORDER — LACTATED RINGERS IV SOLN: INTRAVENOUS | Status: DC

## 2020-10-12 MED ORDER — AMISULPRIDE (ANTIEMETIC) 5 MG/2ML IV SOLN: 10.0000 mg | Freq: Once | INTRAVENOUS | Status: DC | PRN

## 2020-10-12 MED ORDER — PROMETHAZINE HCL 25 MG/ML IJ SOLN: 6.2500 mg | INTRAMUSCULAR | Status: DC | PRN

## 2020-10-12 MED ORDER — CEFAZOLIN SODIUM-DEXTROSE 2-4 GM/100ML-% IV SOLN
2.0000 g | Freq: Four times a day (QID) | INTRAVENOUS | Status: AC
  Administered 2020-10-12 (×2): 2 g via INTRAVENOUS
  Filled 2020-10-12 (×2): qty 100

## 2020-10-12 MED ORDER — CHLORHEXIDINE GLUCONATE 0.12 % MT SOLN
15.0000 mL | Freq: Once | OROMUCOSAL | Status: AC
  Administered 2020-10-12: 15 mL via OROMUCOSAL

## 2020-10-12 MED ORDER — HYDROCODONE-ACETAMINOPHEN 5-325 MG PO TABS
1.0000 | ORAL_TABLET | ORAL | Status: DC | PRN
  Administered 2020-10-12: 1 via ORAL
  Administered 2020-10-12: 2 via ORAL
  Administered 2020-10-13: 1 via ORAL
  Administered 2020-10-13: 2 via ORAL
  Filled 2020-10-12: qty 2
  Filled 2020-10-12 (×2): qty 1
  Filled 2020-10-12: qty 2

## 2020-10-12 MED ORDER — BISACODYL 10 MG RE SUPP: 10.0000 mg | Freq: Every day | RECTAL | Status: DC | PRN

## 2020-10-12 MED ORDER — PHENYLEPHRINE 40 MCG/ML (10ML) SYRINGE FOR IV PUSH (FOR BLOOD PRESSURE SUPPORT)
PREFILLED_SYRINGE | INTRAVENOUS | Status: AC
  Filled 2020-10-12: qty 10

## 2020-10-12 MED ORDER — PROPOFOL 500 MG/50ML IV EMUL
INTRAVENOUS | Status: DC | PRN
  Administered 2020-10-12: 100 ug/kg/min via INTRAVENOUS

## 2020-10-12 MED ORDER — TRAMADOL HCL 50 MG PO TABS
50.0000 mg | ORAL_TABLET | Freq: Four times a day (QID) | ORAL | Status: DC | PRN
  Administered 2020-10-12 – 2020-10-13 (×2): 100 mg via ORAL
  Filled 2020-10-12 (×2): qty 2

## 2020-10-12 MED ORDER — ONDANSETRON HCL 4 MG/2ML IJ SOLN
INTRAMUSCULAR | Status: DC | PRN
  Administered 2020-10-12: 4 mg via INTRAVENOUS

## 2020-10-12 MED ORDER — MIDAZOLAM HCL 2 MG/2ML IJ SOLN
INTRAMUSCULAR | Status: AC
  Filled 2020-10-12: qty 2

## 2020-10-12 MED ORDER — METHOCARBAMOL 500 MG IVPB - SIMPLE MED
500.0000 mg | Freq: Four times a day (QID) | INTRAVENOUS | Status: DC | PRN
  Filled 2020-10-12: qty 50

## 2020-10-12 MED ORDER — ONDANSETRON HCL 4 MG/2ML IJ SOLN: 4.0000 mg | Freq: Four times a day (QID) | INTRAMUSCULAR | Status: DC | PRN

## 2020-10-12 MED ORDER — OXYCODONE HCL 5 MG PO TABS
5.0000 mg | ORAL_TABLET | Freq: Once | ORAL | Status: AC | PRN
  Administered 2020-10-12: 5 mg via ORAL

## 2020-10-12 MED ORDER — OXYCODONE HCL 5 MG/5ML PO SOLN: 5.0000 mg | Freq: Once | ORAL | Status: AC | PRN

## 2020-10-12 MED ORDER — ACETAMINOPHEN 10 MG/ML IV SOLN
1000.0000 mg | Freq: Four times a day (QID) | INTRAVENOUS | Status: DC
  Administered 2020-10-12: 1000 mg via INTRAVENOUS
  Filled 2020-10-12: qty 100

## 2020-10-12 MED ORDER — DEXAMETHASONE SODIUM PHOSPHATE 10 MG/ML IJ SOLN
8.0000 mg | Freq: Once | INTRAMUSCULAR | Status: AC
  Administered 2020-10-12: 8 mg via INTRAVENOUS

## 2020-10-12 MED ORDER — BUPIVACAINE IN DEXTROSE 0.75-8.25 % IT SOLN
INTRATHECAL | Status: DC | PRN
  Administered 2020-10-12: 1.6 mL via INTRATHECAL

## 2020-10-12 MED ORDER — METHOCARBAMOL 500 MG PO TABS
500.0000 mg | ORAL_TABLET | Freq: Four times a day (QID) | ORAL | Status: DC | PRN
  Administered 2020-10-12 – 2020-10-13 (×3): 500 mg via ORAL
  Filled 2020-10-12 (×3): qty 1

## 2020-10-12 MED ORDER — TRANEXAMIC ACID-NACL 1000-0.7 MG/100ML-% IV SOLN
1000.0000 mg | INTRAVENOUS | Status: AC
  Administered 2020-10-12: 1000 mg via INTRAVENOUS
  Filled 2020-10-12: qty 100

## 2020-10-12 MED ORDER — 0.9 % SODIUM CHLORIDE (POUR BTL) OPTIME
TOPICAL | Status: DC | PRN
  Administered 2020-10-12: 1000 mL

## 2020-10-12 MED ORDER — DOCUSATE SODIUM 100 MG PO CAPS
100.0000 mg | ORAL_CAPSULE | Freq: Two times a day (BID) | ORAL | Status: DC
  Administered 2020-10-12 – 2020-10-13 (×2): 100 mg via ORAL
  Filled 2020-10-12 (×3): qty 1

## 2020-10-12 MED ORDER — CEFAZOLIN SODIUM-DEXTROSE 2-4 GM/100ML-% IV SOLN
2.0000 g | INTRAVENOUS | Status: AC
  Administered 2020-10-12: 2 g via INTRAVENOUS
  Filled 2020-10-12: qty 100

## 2020-10-12 MED ORDER — PHENOL 1.4 % MT LIQD: 1.0000 | OROMUCOSAL | Status: DC | PRN

## 2020-10-12 MED ORDER — ONDANSETRON HCL 4 MG PO TABS: 4.0000 mg | ORAL_TABLET | Freq: Four times a day (QID) | ORAL | Status: DC | PRN

## 2020-10-12 MED ORDER — DEXAMETHASONE SODIUM PHOSPHATE 10 MG/ML IJ SOLN
10.0000 mg | Freq: Once | INTRAMUSCULAR | Status: AC
Start: 2020-10-13 — End: 2020-10-13
  Administered 2020-10-13: 10 mg via INTRAVENOUS
  Filled 2020-10-12: qty 1

## 2020-10-12 MED ORDER — PHENYLEPHRINE HCL-NACL 10-0.9 MG/250ML-% IV SOLN
INTRAVENOUS | Status: DC | PRN
  Administered 2020-10-12: 25 ug/min via INTRAVENOUS

## 2020-10-12 MED ORDER — DEXAMETHASONE SODIUM PHOSPHATE 10 MG/ML IJ SOLN
INTRAMUSCULAR | Status: AC
  Filled 2020-10-12: qty 1

## 2020-10-12 MED ORDER — METOCLOPRAMIDE HCL 5 MG/ML IJ SOLN: 5.0000 mg | Freq: Three times a day (TID) | INTRAMUSCULAR | Status: DC | PRN

## 2020-10-12 MED ORDER — METOCLOPRAMIDE HCL 5 MG PO TABS: 5.0000 mg | ORAL_TABLET | Freq: Three times a day (TID) | ORAL | Status: DC | PRN

## 2020-10-12 MED ORDER — MENTHOL 3 MG MT LOZG: 1.0000 | LOZENGE | OROMUCOSAL | Status: DC | PRN

## 2020-10-12 MED ORDER — PROPOFOL 1000 MG/100ML IV EMUL
INTRAVENOUS | Status: AC
  Filled 2020-10-12: qty 100

## 2020-10-12 MED ORDER — PHENYLEPHRINE HCL (PRESSORS) 10 MG/ML IV SOLN
INTRAVENOUS | Status: AC
  Filled 2020-10-12: qty 1

## 2020-10-12 MED ORDER — MAGNESIUM CITRATE PO SOLN: 1.0000 | Freq: Once | ORAL | Status: DC | PRN

## 2020-10-12 MED ORDER — AMLODIPINE BESYLATE 5 MG PO TABS: 5.0000 mg | ORAL_TABLET | Freq: Every day | ORAL | Status: DC

## 2020-10-12 MED ORDER — ACETAMINOPHEN 325 MG PO TABS: 325.0000 mg | ORAL_TABLET | Freq: Four times a day (QID) | ORAL | Status: DC | PRN

## 2020-10-12 MED ORDER — BUPIVACAINE HCL 0.25 % IJ SOLN
INTRAMUSCULAR | Status: AC
  Filled 2020-10-12: qty 1

## 2020-10-12 MED ORDER — ORAL CARE MOUTH RINSE: 15.0000 mL | Freq: Once | OROMUCOSAL | Status: AC

## 2020-10-12 MED ORDER — HYDROMORPHONE HCL 1 MG/ML IJ SOLN: 0.2500 mg | INTRAMUSCULAR | Status: DC | PRN

## 2020-10-12 MED ORDER — WATER FOR IRRIGATION, STERILE IR SOLN
Status: DC | PRN
  Administered 2020-10-12: 1000 mL

## 2020-10-12 MED ORDER — POLYETHYLENE GLYCOL 3350 17 G PO PACK
17.0000 g | PACK | Freq: Every day | ORAL | Status: DC | PRN
  Filled 2020-10-12: qty 1

## 2020-10-12 MED ORDER — PROPOFOL 10 MG/ML IV BOLUS
INTRAVENOUS | Status: AC
  Filled 2020-10-12: qty 20

## 2020-10-12 MED ORDER — MIDAZOLAM HCL 2 MG/2ML IJ SOLN
INTRAMUSCULAR | Status: DC | PRN
  Administered 2020-10-12: 1 mg via INTRAVENOUS

## 2020-10-12 MED ORDER — ONDANSETRON HCL 4 MG/2ML IJ SOLN
INTRAMUSCULAR | Status: AC
  Filled 2020-10-12: qty 2

## 2020-10-12 MED ORDER — OXYCODONE HCL 5 MG PO TABS
ORAL_TABLET | ORAL | Status: AC
  Filled 2020-10-12: qty 1

## 2020-10-12 MED ORDER — POVIDONE-IODINE 10 % EX SWAB
2.0000 "application " | Freq: Once | CUTANEOUS | Status: AC
  Administered 2020-10-12: 2 via TOPICAL

## 2020-10-12 MED ORDER — DIPHENHYDRAMINE HCL 25 MG PO CAPS: 25.0000 mg | ORAL_CAPSULE | Freq: Every day | ORAL | Status: DC | PRN

## 2020-10-12 MED ORDER — MORPHINE SULFATE (PF) 4 MG/ML IV SOLN: 0.5000 mg | INTRAVENOUS | Status: DC | PRN

## 2020-10-12 MED ORDER — RIVAROXABAN 10 MG PO TABS
10.0000 mg | ORAL_TABLET | Freq: Every day | ORAL | Status: DC
  Administered 2020-10-13: 10 mg via ORAL
  Filled 2020-10-12: qty 1

## 2020-10-12 MED ORDER — BUPIVACAINE HCL 0.25 % IJ SOLN
INTRAMUSCULAR | Status: DC | PRN
  Administered 2020-10-12: 30 mL

## 2020-10-12 MED ORDER — HYDROCHLOROTHIAZIDE 12.5 MG PO CAPS
12.5000 mg | ORAL_CAPSULE | Freq: Every day | ORAL | Status: DC
  Administered 2020-10-13: 12.5 mg via ORAL
  Filled 2020-10-12: qty 1

## 2020-10-12 SURGICAL SUPPLY — 45 items
BAG DECANTER FOR FLEXI CONT (MISCELLANEOUS) IMPLANT
BAG ZIPLOCK 12X15 (MISCELLANEOUS) IMPLANT
BLADE SAG 18X100X1.27 (BLADE) ×2 IMPLANT
CLEANER TIP ELECTROSURG 2X2 (MISCELLANEOUS) ×2 IMPLANT
COVER PERINEAL POST (MISCELLANEOUS) ×2 IMPLANT
COVER SURGICAL LIGHT HANDLE (MISCELLANEOUS) ×2 IMPLANT
COVER WAND RF STERILE (DRAPES) IMPLANT
CUP ACET PINNACLE SECTR 50MM (Hips) ×1 IMPLANT
DECANTER SPIKE VIAL GLASS SM (MISCELLANEOUS) ×2 IMPLANT
DRAPE FOOT SWITCH (DRAPES) ×2 IMPLANT
DRAPE STERI IOBAN 125X83 (DRAPES) ×2 IMPLANT
DRAPE U-SHAPE 47X51 STRL (DRAPES) ×4 IMPLANT
DRESSING AQUACEL AG SP 3.5X10 (GAUZE/BANDAGES/DRESSINGS) ×1 IMPLANT
DRSG AQUACEL AG ADV 3.5X10 (GAUZE/BANDAGES/DRESSINGS) ×2 IMPLANT
DRSG AQUACEL AG SP 3.5X10 (GAUZE/BANDAGES/DRESSINGS) ×2
DURAPREP 26ML APPLICATOR (WOUND CARE) ×2 IMPLANT
ELECT REM PT RETURN 15FT ADLT (MISCELLANEOUS) ×2 IMPLANT
GLOVE SRG 8 PF TXTR STRL LF DI (GLOVE) ×1 IMPLANT
GLOVE SURG ENC MOIS LTX SZ6.5 (GLOVE) ×2 IMPLANT
GLOVE SURG ENC MOIS LTX SZ7 (GLOVE) ×2 IMPLANT
GLOVE SURG ENC MOIS LTX SZ8 (GLOVE) ×4 IMPLANT
GLOVE SURG UNDER POLY LF SZ7 (GLOVE) ×2 IMPLANT
GLOVE SURG UNDER POLY LF SZ8 (GLOVE) ×1
GLOVE SURG UNDER POLY LF SZ8.5 (GLOVE) IMPLANT
GOWN STRL REUS W/TWL LRG LVL3 (GOWN DISPOSABLE) ×4 IMPLANT
GOWN STRL REUS W/TWL XL LVL3 (GOWN DISPOSABLE) IMPLANT
HEAD FEM STD 32X+1 STRL (Hips) ×2 IMPLANT
HOLDER FOLEY CATH W/STRAP (MISCELLANEOUS) ×2 IMPLANT
KIT TURNOVER KIT A (KITS) ×2 IMPLANT
LINER MARATHON 32 50 (Hips) ×2 IMPLANT
MANIFOLD NEPTUNE II (INSTRUMENTS) ×2 IMPLANT
PACK ANTERIOR HIP CUSTOM (KITS) ×2 IMPLANT
PENCIL SMOKE EVACUATOR COATED (MISCELLANEOUS) ×2 IMPLANT
PINNACLE SECTOR CUP 50MM (Hips) ×2 IMPLANT
STEM FEM ACTIS STD SZ4 (Stem) ×2 IMPLANT
STRIP CLOSURE SKIN 1/2X4 (GAUZE/BANDAGES/DRESSINGS) ×4 IMPLANT
SUT ETHIBOND NAB CT1 #1 30IN (SUTURE) ×2 IMPLANT
SUT MNCRL AB 4-0 PS2 18 (SUTURE) ×2 IMPLANT
SUT STRATAFIX 0 PDS 27 VIOLET (SUTURE) ×2
SUT VIC AB 2-0 CT1 27 (SUTURE) ×2
SUT VIC AB 2-0 CT1 TAPERPNT 27 (SUTURE) ×2 IMPLANT
SUTURE STRATFX 0 PDS 27 VIOLET (SUTURE) ×1 IMPLANT
SYR 50ML LL SCALE MARK (SYRINGE) IMPLANT
TRAY FOLEY MTR SLVR 16FR STAT (SET/KITS/TRAYS/PACK) ×2 IMPLANT
TUBE SUCTION HIGH CAP CLEAR NV (SUCTIONS) ×2 IMPLANT

## 2020-10-12 NOTE — Op Note (Signed)
OPERATIVE REPORT- TOTAL HIP ARTHROPLASTY   PREOPERATIVE DIAGNOSIS: Osteoarthritis of the Left hip.   POSTOPERATIVE DIAGNOSIS: Osteoarthritis of the Left  hip.   PROCEDURE: Left total hip arthroplasty, anterior approach.   SURGEON: Gaynelle Arabian, MD   ASSISTANT: Theresa Duty, PA-C  ANESTHESIA:  Spinal  ESTIMATED BLOOD LOSS:-450 mL    DRAINS: Hemovac x1.   COMPLICATIONS: None   CONDITION: PACU - hemodynamically stable.   BRIEF CLINICAL NOTE: Ashley Holland is a 80 y.o. female who has advanced end-  stage arthritis of their Left  hip with progressively worsening pain and  dysfunction.The pLeftatient has failed nonoperative management and presents for  total hip arthroplasty.   PROCEDURE IN DETAIL: After successful administration of spinal  anesthetic, the traction boots for the [facility] bed were placed on both  feet and the patient was placed onto the [facility] bed, boots placed into the leg  holders. The Left hip was then isolated from the perineum with plastic  drapes and prepped and draped in the usual sterile fashion. ASIS and  greater trochanter were marked and a oblique incision was made, starting  at about 1 cm lateral and 2 cm distal to the ASIS and coursing towards  the anterior cortex of the femur. The skin was cut with a 10 blade  through subcutaneous tissue to the level of the fascia overlying the  tensor fascia lata muscle. The fascia was then incised in line with the  incision at the junction of the anterior third and posterior 2/3rd. The  muscle was teased off the fascia and then the interval between the TFL  and the rectus was developed. The Hohmann retractor was then placed at  the top of the femoral neck over the capsule. The vessels overlying the  capsule were cauterized and the fat on top of the capsule was removed.  A Hohmann retractor was then placed anterior underneath the rectus  femoris to give exposure to the entire anterior capsule. A T-shaped   capsulotomy was performed. The edges were tagged and the femoral head  was identified.       Osteophytes are removed off the superior acetabulum.  The femoral neck was then cut in situ with an oscillating saw. Traction  was then applied to the left lower extremity utilizing the [employer]  traction. The femoral head was then removed. Retractors were placed  around the acetabulum and then circumferential removal of the labrum was  performed. Osteophytes were also removed. Reaming starts at 47 mm to  medialize and  Increased in 2 mm increments to 49 mm. We reamed in  approximately 40 degrees of abduction, 20 degrees anteversion. A 50 mm  pinnacle acetabular shell was then impacted in anatomic position under  fluoroscopic guidance with excellent purchase. We did not need to place  any additional dome screws. A 32 mm neutral + 4 marathon liner was then  placed into the acetabular shell.       The femoral lift was then placed along the lateral aspect of the femur  just distal to the vastus ridge. The leg was  externally rotated and capsule  was stripped off the inferior aspect of the femoral neck down to the  level of the lesser trochanter, this was done with electrocautery. The femur was lifted after this was performed. The  leg was then placed in an extended and adducted position essentially delivering the femur. We also removed the capsule superiorly and the piriformis from the piriformis  fossa to gain excellent exposure of the  proximal femur. Rongeur was used to remove some cancellous bone to get  into the lateral portion of the proximal femur for placement of the  initial starter reamer. The starter broaches was placed  the starter broach  and was shown to go down the center of the canal. Broaching  with the Actis system was then performed starting at size 0  coursing  Up to size 4. A size 4 had excellent torsional and rotational  and axial stability. The trial standard offset neck was then  placed  with a 32 + 1 trial head. The hip was then reduced. We confirmed that  the stem was in the canal both on AP and lateral x-rays. It also has excellent sizing. The hip was reduced with outstanding stability through full extension and full external rotation.. AP pelvis was taken and the leg lengths were measured and found to be equal. Hip was then dislocated again and the femoral head and neck removed. The  femoral broach was removed. Size 4 Actis stem with a standard offset  neck was then impacted into the femur following native anteversion. Has  excellent purchase in the canal. Excellent torsional and rotational and  axial stability. It is confirmed to be in the canal on AP and lateral  fluoroscopic views. The 32 + 1 metal head was placed and the hip  reduced with outstanding stability. Again AP pelvis was taken and it  confirmed that the leg lengths were equal. The wound was then copiously  irrigated with saline solution and the capsule reattached and repaired  with Ethibond suture. 30 ml of .25% Bupivicaine was  injected into the capsule and into the edge of the tensor fascia lata as well as subcutaneous tissue. The fascia overlying the tensor fascia lata was then closed with a running #1 V-Loc. Subcu was closed with interrupted 2-0 Vicryl and subcuticular running 4-0 Monocryl. Incision was cleaned  and dried. Steri-Strips and a bulky sterile dressing applied. The patient was awakened and transported to  recovery in stable condition.        Please note that a surgical assistant was a medical necessity for this procedure to perform it in a safe and expeditious manner. Assistant was necessary to provide appropriate retraction of vital neurovascular structures and to prevent femoral fracture and allow for anatomic placement of the prosthesis.  Gaynelle Arabian, M.D.

## 2020-10-12 NOTE — H&P (Signed)
TOTAL HIP ADMISSION H&P  Patient is admitted for left total hip arthroplasty.  Subjective:  Chief Complaint: Left hip pain  HPI: Ashley Holland, 80 y.o. female, has a history of pain and functional disability in the left hip due to arthritis and patient has failed non-surgical conservative treatments for greater than 12 weeks to include NSAID's and/or analgesics and activity modification. Onset of symptoms was gradual, starting  several  years ago with gradually worsening course since that time. The patient noted no past surgery on the left hip. Patient currently rates pain in the left hip at 7 out of 10 with activity. Patient has worsening of pain with activity and weight bearing and pain that interfers with activities of daily living. Patient has evidence of periarticular osteophytes and joint space narrowing by imaging studies. This condition presents safety issues increasing the risk of falls. There is no current active infection.  Patient Active Problem List   Diagnosis Date Noted   Iron deficiency anemia secondary to inadequate dietary iron intake 06/18/2018   Colon polyp 03/07/2015   OA (osteoarthritis) of hip 07/26/2014   Autoimmune thyroiditis 02/21/2014   Family history of thyroid cancer 02/21/2014   Malignant neoplasm of right breast ([place]) 12/10/2011   Arthritis/joint pain 09/22/2010   Menopause 09/22/2010   HTN (hypertension) 08/21/2010   Precordial pain    Hyperlipidemia     Past Medical History:  Diagnosis Date   Arthritis    Per medical history form dated 11/04/09.past history DJD back   Enlarged thyroid    "was told had nodules" - no swallowing problems   Female bladder prolapse    GERD (gastroesophageal reflux disease) 05/19/2013   Malignant neoplasm of breast (female), unspecified site    right breast cancer. surgery, radiation, oral meds- Arimidex.   Nontoxic multinodular goiter 11/10/2015   Other and unspecified hyperlipidemia    Rectal prolapse    stool  softeners helpful.   Unspecified essential hypertension     Past Surgical History:  Procedure Laterality Date   ABDOMINAL HYSTERECTOMY     BREAST LUMPECTOMY  2010   Right   CHOLECYSTECTOMY     COLONOSCOPY     EUS  04/25/2011   Procedure: UPPER ENDOSCOPIC ULTRASOUND (EUS) RADIAL;  Surgeon: Landry Dyke, MD;  Location: [facility] ENDOSCOPY;  Service: Endoscopy;  Laterality: N/A;   TOTAL HIP ARTHROPLASTY Right 07/26/2014   Procedure: RIGHT TOTAL HIP ARTHROPLASTY ANTERIOR APPROACH;  Surgeon: Gaynelle Arabian, MD;  Location: [facility] ORS;  Service: Orthopedics;  Laterality: Right;   UPPER GASTROINTESTINAL ENDOSCOPY  Sept 2012    Prior to Admission medications   Medication Sig Start Date End Date Taking? Authorizing Provider  amLODipine (NORVASC) 5 MG tablet TAKE 1 TABLET BY MOUTH EVERY DAY Patient taking differently: Take 5 mg by mouth at bedtime. 04/28/20  Yes Gottschalk, Leatrice Jewels M, DO  ARTIFICIAL TEAR OP Place 1 drop into both eyes daily as needed (dry eyes).   Yes [provider]  Calcium Citrate-Vitamin D (CALCIUM + D PO) Take 1 tablet by mouth daily.   Yes [provider]  diphenhydrAMINE (BENADRYL) 25 MG tablet Take 25 mg by mouth daily as needed for itching.   Yes [provider]  hydrochlorothiazide (MICROZIDE) 12.5 MG capsule TAKE 1 CAPSULE BY MOUTH EVERY DAY Patient taking differently: Take 12.5 mg by mouth daily. 05/04/20  Yes Gottschalk, Leatrice Jewels M, DO  ibuprofen (ADVIL) 200 MG tablet Take 400 mg by mouth at bedtime as needed for moderate pain.   Yes  [provider]  Multiple Vitamin (MULTIVITAMIN WITH MINERALS) TABS tablet Take 1 tablet by mouth daily.   Yes [provider]    No Known Allergies  Social History   Socioeconomic History   Marital status: Married    Spouse name: Not on file   Number of children: 2   Years of education: Not on file   Highest education level: Not on file  Occupational History   Occupation: Retired    Comment: Editor, commissioning  Tobacco Use   Smoking status: Never   Smokeless tobacco: Never  Vaping Use   Vaping Use: Never used  Substance and Sexual Activity   Alcohol use: No   Drug use: No   Sexual activity: Not Currently  Other Topics Concern   Not on file  Social History Narrative   Not on file   Social Determinants of Health   Financial Resource Strain: Not on file  Food Insecurity: Not on file  Transportation Needs: Not on file  Physical Activity: Not on file  Stress: Not on file  Social Connections: Not on file  Intimate Partner Violence: Not on file    Tobacco Use: Low Risk    Smoking Tobacco Use: Never   Smokeless Tobacco Use: Never   Social History   Substance and Sexual Activity  Alcohol Use No    Family History  Problem Relation Age of Onset   Coronary artery disease Mother 32        CABG age 59   Hyperlipidemia Mother    Stroke Mother    Alcohol abuse Father    Hypertension Father    Hypertension Sister    Hypertension Brother    Arthritis Brother    Anesthesia problems Neg Hx    Hypotension Neg Hx    Malignant hyperthermia Neg Hx    Pseudochol deficiency Neg Hx     ROS: Constitutional: no fever, no chills, no night sweats, no significant weight loss Cardiovascular: no chest pain, no palpitations Respiratory: no cough, no shortness of breath, No COPD Gastrointestinal: no vomiting, no nausea Musculoskeletal: no swelling in Joints, Joint Pain Neurologic: no numbness, no tingling, no difficulty with balance    Objective:  Physical Exam: General: Alert and oriented x3, cooperative and pleasant, no acute distress.  Head: normocephalic, atraumatic, neck supple.  Eyes: EOMI.  Respiratory: breath sounds clear in all fields, no wheezing, rales, or rhonchi. Cardiovascular: Regular rate and rhythm, no murmurs, gallops or rubs.  Abdomen: non-tender to palpation and soft, normoactive bowel sounds. Musculoskeletal:  The patient has an antalgic gait pattern  favoring the left side without the use of assistive devices.    Right Hip Exam:  The range of motion: Flexion to 120 degrees, Internal Rotation to 30 degrees, External Rotation to 40 degrees, and abduction to 40 degrees without discomfort.  Incision is well healed.    Left Hip Exam:  The range of motion: Flexion to 100 degrees, Internal Rotation is minimal, External Rotation to 10 to 20 degrees, and abduction to 20 degrees with pain on range of motion.  There is no tenderness over the greater trochanteric bursa.    The patient's sensation and motor function are intact in their lower extremities. Their distal pulses are 2+ The bilateral calves are soft and non-tender.   Vital signs in last 24 hours:    Imaging Review Radiographs- AP pelvis, AP and lateral of the left hip dated 06/17/2020 that she brought with her today demonstrate bone-on-bone arthritis in the  left hip. The right hip prosthesis is in good position with no periprosthetic abnormalities.   Assessment/Plan:  End stage arthritis, left hip  The patient history, physical examination, clinical judgement of the provider and imaging studies are consistent with end stage degenerative joint disease of the left hip and total hip arthroplasty is deemed medically necessary. The treatment options including medical management, injection therapy, arthroscopy and arthroplasty were discussed at length. The risks and benefits of total hip arthroplasty were presented and reviewed. The risks due to aseptic loosening, infection, stiffness, dislocation/subluxation, thromboembolic complications and other imponderables were discussed. The patient acknowledged the explanation, agreed to proceed with the plan and consent was signed. Patient is being admitted for inpatient treatment for surgery, pain control, PT, OT, prophylactic antibiotics, VTE prophylaxis, progressive ambulation and ADLs and discharge planning.The patient is planning to be discharged   home .   Patient's anticipated LOS is less than 2 midnights, meeting these requirements: - Lives within 1 hour of care - Has a competent adult at home to recover with post-op recovery - NO history of  - Chronic pain requiring opioids  - Diabetes  - Coronary Artery Disease  - Heart failure  - Heart attack  - Stroke  - DVT/VTE  - Cardiac arrhythmia  - Respiratory Failure/COPD  - Renal failure  - Anemia  - Advanced Liver disease  Therapy Plans: HEP Disposition: Home with Husband Planned DVT Prophylaxis: Xarelto 10mg  (History of Cancer) DME Needed: None PCP: Adam Phenix, DO (clearance received) TXA: IV Allergies: NKDA Anesthesia Concerns: None BMI: 28.3 Last HgbA1c: None  Ph[facility] [address] in [place], [place]    - Patient was instructed on what medications to stop prior to surgery. - Follow-up visit in 2 weeks with Dr. Wynelle Link - Begin physical therapy following surgery - Pre-operative lab work as pre-surgical testing - Prescriptions will be provided in hospital at time of discharge  Fenton Foy, [facility], PA-C Orthopedic Surgery [facility] [place]

## 2020-10-12 NOTE — Transfer of Care (Signed)
Immediate Anesthesia Transfer of Care Note  Patient: Ashley Holland  Procedure(s) Performed: TOTAL HIP ARTHROPLASTY ANTERIOR APPROACH (Left: Hip)  Patient Location: PACU  Anesthesia Type:Spinal  Level of Consciousness: awake  Airway & Oxygen Therapy: Patient Spontanous Breathing and Patient connected to face mask oxygen  Post-op Assessment: Report given to RN and Post -op Vital signs reviewed and stable  Post vital signs: Reviewed and stable  Last Vitals:  Vitals Value Taken Time  BP 103/70   Temp    Pulse 57   Resp    SpO2 100     Last Pain:  Vitals:   10/12/20 0715  TempSrc:   PainSc: 0-No pain      Patients Stated Pain Goal: 4 (40/97/35 3299)  Complications: No notable events documented.

## 2020-10-12 NOTE — Interval H&P Note (Signed)
History and Physical Interval Note:  10/12/2020 7:41 AM  Ashley Holland  has presented today for surgery, with the diagnosis of left hip osteoarthritis.  The various methods of treatment have been discussed with the patient and family. After consideration of risks, benefits and other options for treatment, the patient has consented to  Procedure(s): TOTAL HIP ARTHROPLASTY ANTERIOR APPROACH (Left) as a surgical intervention.  The patient's history has been reviewed, patient examined, no change in status, stable for surgery.  I have reviewed the patient's chart and labs.  Questions were answered to the patient's satisfaction.     Pilar Plate [name]

## 2020-10-12 NOTE — Care Plan (Signed)
Ortho Bundle Case Management Note  Patient Details  Name: Ashley Holland MRN: [id number] Date of Birth: 1940-08-13  L THA on 10-12-20 DCP:  Home with husband.  1 story home with 3 ste. DME:  No needs.  Has a RW and doesn't want a 3-in-1. PT:  HEP                   DME Arranged:  N/A DME Agency:  NA  HH Arranged:  NA HH Agency:  NA  Additional Comments: Please contact me with any questions of if this plan should need to change.  Marianne Sofia, RN,CCM [facility]  [phone] 10/12/2020, 5:01 PM

## 2020-10-12 NOTE — Anesthesia Procedure Notes (Signed)
Procedure Name: MAC Date/Time: 10/12/2020 9:52 AM Performed by: Niel Hummer, CRNA Pre-anesthesia Checklist: Patient identified, Emergency Drugs available, Suction available and Patient being monitored Oxygen Delivery Method: Simple face mask

## 2020-10-12 NOTE — Anesthesia Preprocedure Evaluation (Signed)
Anesthesia Evaluation  Patient identified by MRN, date of birth, ID band Patient awake    Reviewed: Allergy & Precautions, H&P , NPO status , Patient's Chart, lab work & pertinent test results  Airway Mallampati: II  TM Distance: >3 FB Neck ROM: full    Dental no notable dental hx. (+) Teeth Intact, Dental Advisory Given   Pulmonary neg pulmonary ROS,    Pulmonary exam normal breath sounds clear to auscultation       Cardiovascular Exercise Tolerance: Good hypertension, Pt. on medications  Rhythm:regular Rate:Normal  History CP.  None recently   Neuro/Psych negative neurological ROS  negative psych ROS   GI/Hepatic negative GI ROS, Neg liver ROS, GERD  Medicated and Controlled,  Endo/Other  negative endocrine ROS  Renal/GU negative Renal ROS  negative genitourinary   Musculoskeletal  (+) Arthritis , Osteoarthritis,    Abdominal   Peds  Hematology negative hematology ROS (+)   Anesthesia Other Findings   Reproductive/Obstetrics negative OB ROS                             Anesthesia Physical  Anesthesia Plan  ASA: II  Anesthesia Plan: Spinal   Post-op Pain Management:    Induction: Intravenous  PONV Risk Score and Plan: 2 and Ondansetron, Midazolam and Treatment may vary due to age or medical condition  Airway Management Planned: Simple Face Mask  Additional Equipment:   Intra-op Plan:   Post-operative Plan:   Informed Consent: I have reviewed the patients History and Physical, chart, labs and discussed the procedure including the risks, benefits and alternatives for the proposed anesthesia with the patient or authorized representative who has indicated his/her understanding and acceptance.     Dental Advisory Given  Plan Discussed with: CRNA and Surgeon  Anesthesia Plan Comments:         Anesthesia Quick Evaluation

## 2020-10-12 NOTE — Anesthesia Postprocedure Evaluation (Signed)
Anesthesia Post Note  Patient: DANAMARIE MINAMI  Procedure(s) Performed: TOTAL HIP ARTHROPLASTY ANTERIOR APPROACH (Left: Hip)     Patient location during evaluation: PACU Anesthesia Type: Spinal Level of consciousness: awake and alert Pain management: pain level controlled Vital Signs Assessment: post-procedure vital signs reviewed and stable Respiratory status: spontaneous breathing, nonlabored ventilation and respiratory function stable Cardiovascular status: blood pressure returned to baseline and stable Postop Assessment: no apparent nausea or vomiting Anesthetic complications: no   No notable events documented.  Last Vitals:  Vitals:   10/12/20 1300 10/12/20 1315  BP: 116/68 109/67  Pulse: (!) 59 (!) 58  Resp: 12 12  Temp:  (!) 36.3 C  SpO2: 94% 94%    Last Pain:  Vitals:   10/12/20 1223  TempSrc:   PainSc: [place]

## 2020-10-12 NOTE — Discharge Instructions (Addendum)
Ashley Aluisio, MD Total Joint Specialist [facility] [address] [place], [place] [address] [phone]  ANTERIOR APPROACH TOTAL HIP REPLACEMENT POSTOPERATIVE DIRECTIONS     Hip Rehabilitation, Guidelines Following Surgery  The results of a hip operation are greatly improved after range of motion and muscle strengthening exercises. Follow all safety measures which are given to protect your hip. If any of these exercises cause increased pain or swelling in your joint, decrease the amount until you are comfortable again. Then slowly increase the exercises. Call your caregiver if you have problems or questions.   BLOOD CLOT PREVENTION Take a 10 mg Xarelto once a day for three weeks following surgery. Then take an 81 mg Aspirin once a day for three weeks. Then discontinue Aspirin. You may resume your vitamins/supplements once you have discontinued the Xarelto. Do not take any NSAIDs (Advil, Aleve, Ibuprofen, Meloxicam, etc.) until you have discontinued the Xarelto.   HOME CARE INSTRUCTIONS  Remove items at home which could result in a fall. This includes throw rugs or furniture in walking pathways.  ICE to the affected hip as frequently as 20-30 minutes an hour and then as needed for pain and swelling. Continue to use ice on the hip for pain and swelling from surgery. You may notice swelling that will progress down to the foot and ankle. This is normal after surgery. Elevate the leg when you are not up walking on it.   Continue to use the breathing machine which will help keep your temperature down.  It is common for your temperature to cycle up and down following surgery, especially at night when you are not up moving around and exerting yourself.  The breathing machine keeps your lungs expanded and your temperature down.  DIET You may resume your previous home diet once your are discharged from the hospital.  DRESSING / WOUND CARE / SHOWERING You have an  adhesive waterproof bandage over the incision. Leave this in place until your first follow-up appointment. Once you remove this you will not need to place another bandage.  You may begin showering 3 days following surgery, but do not submerge the incision under water.  ACTIVITY For the first 3-5 days, it is important to rest and keep the operative leg elevated. You should, as a general rule, rest for 50 minutes and walk/stretch for 10 minutes per hour. After 5 days, you may slowly increase activity as tolerated.  Perform the exercises you were provided twice a day for about 15-20 minutes each session. Begin these 2 days following surgery. Walk with your walker as instructed. Use the walker until you are comfortable transitioning to a cane. Walk with the cane in the opposite hand of the operative leg. You may discontinue the cane once you are comfortable and walking steadily. Avoid periods of inactivity such as sitting longer than an hour when not asleep. This helps prevent blood clots.  Do not drive a car for 6 weeks or until released by your surgeon.  Do not drive while taking narcotics.  TED HOSE STOCKINGS Wear the elastic stockings on both legs for three weeks following surgery during the day. You may remove them at night while sleeping.  WEIGHT BEARING Weight bearing as tolerated with assist device (walker, cane, etc) as directed, use it as long as suggested by your surgeon or therapist, typically at least 4-6 weeks.  POSTOPERATIVE CONSTIPATION PROTOCOL Constipation - defined medically as fewer than three stools per week and severe constipation as less   than one stool per week.  One of the most common issues patients have following surgery is constipation.  Even if you have a regular bowel pattern at home, your normal regimen is likely to be disrupted due to multiple reasons following surgery.  Combination of anesthesia, postoperative narcotics, change in appetite and fluid intake all can  affect your bowels.  In order to avoid complications following surgery, here are some recommendations in order to help you during your recovery period.  Colace (docusate) - Pick up an over-the-counter form of Colace or another stool softener and take twice a day as long as you are requiring postoperative pain medications.  Take with a full glass of water daily.  If you experience loose stools or diarrhea, hold the colace until you stool forms back up.  If your symptoms do not get better within 1 week or if they get worse, check with your doctor. Dulcolax (bisacodyl) - Pick up over-the-counter and take as directed by the product packaging as needed to assist with the movement of your bowels.  Take with a full glass of water.  Use this product as needed if not relieved by Colace only.  MiraLax (polyethylene glycol) - Pick up over-the-counter to have on hand.  MiraLax is a solution that will increase the amount of water in your bowels to assist with bowel movements.  Take as directed and can mix with a glass of water, juice, soda, coffee, or tea.  Take if you go more than two days without a movement.Do not use MiraLax more than once per day. Call your doctor if you are still constipated or irregular after using this medication for 7 days in a row.  If you continue to have problems with postoperative constipation, please contact the office for further assistance and recommendations.  If you experience "the worst abdominal pain ever" or develop nausea or vomiting, please contact the office immediatly for further recommendations for treatment.  ITCHING  If you experience itching with your medications, try taking only a single pain pill, or even half a pain pill at a time.  You can also use Benadryl over the counter for itching or also to help with sleep.   MEDICATIONS See your medication summary on the "After Visit Summary" that the nursing staff will review with you prior to discharge.  You may have some home  medications which will be placed on hold until you complete the course of blood thinner medication.  It is important for you to complete the blood thinner medication as prescribed by your surgeon.  Continue your approved medications as instructed at time of discharge.  PRECAUTIONS If you experience chest pain or shortness of breath - call 911 immediately for transfer to the hospital emergency department.  If you develop a fever greater that 101 F, purulent drainage from wound, increased redness or drainage from wound, foul odor from the wound/dressing, or calf pain - CONTACT YOUR SURGEON.                                                   FOLLOW-UP APPOINTMENTS Make sure you keep all of your appointments after your operation with your surgeon and caregivers. You should call the office at the above phone number and make an appointment for approximately two weeks after the date of your surgery or on the date  instructed by your surgeon outlined in the "After Visit Summary".  RANGE OF MOTION AND STRENGTHENING EXERCISES  These exercises are designed to help you keep full movement of your hip joint. Follow your caregiver's or physical therapist's instructions. Perform all exercises about fifteen times, three times per day or as directed. Exercise both hips, even if you have had only one joint replacement. These exercises can be done on a training (exercise) mat, on the floor, on a table or on a bed. Use whatever works the best and is most comfortable for you. Use music or television while you are exercising so that the exercises are a pleasant break in your day. This will make your life better with the exercises acting as a break in routine you can look forward to.  Lying on your back, slowly slide your foot toward your buttocks, raising your knee up off the floor. Then slowly slide your foot back down until your leg is straight again.  Lying on your back spread your legs as far apart as you can without causing  discomfort.  Lying on your side, raise your upper leg and foot straight up from the floor as far as is comfortable. Slowly lower the leg and repeat.  Lying on your back, tighten up the muscle in the front of your thigh (quadriceps muscles). You can do this by keeping your leg straight and trying to raise your heel off the floor. This helps strengthen the largest muscle supporting your knee.  Lying on your back, tighten up the muscles of your buttocks both with the legs straight and with the knee bent at a comfortable angle while keeping your heel on the floor.   POST-OPERATIVE OPIOID TAPER INSTRUCTIONS: It is important to wean off of your opioid medication as soon as possible. If you do not need pain medication after your surgery it is ok to stop day one. Opioids include: Codeine, Hydrocodone(Norco, Vicodin), Oxycodone(Percocet, oxycontin) and hydromorphone amongst others.  Long term and even short term use of opiods can cause: Increased pain response Dependence Constipation Depression Respiratory depression And more.  Withdrawal symptoms can include Flu like symptoms Nausea, vomiting And more Techniques to manage these symptoms Hydrate well Eat regular healthy meals Stay active Use relaxation techniques(deep breathing, meditating, yoga) Do Not substitute Alcohol to help with tapering If you have been on opioids for less than two weeks and do not have pain than it is ok to stop all together.  Plan to wean off of opioids This plan should start within one week post op of your joint replacement. Maintain the same interval or time between taking each dose and first decrease the dose.  Cut the total daily intake of opioids by one tablet each day Next start to increase the time between doses. The last dose that should be eliminated is the evening dose.   IF YOU ARE TRANSFERRED TO A SKILLED REHAB FACILITY If the patient is transferred to a skilled rehab facility following release from the  hospital, a list of the current medications will be sent to the facility for the patient to continue.  When discharged from the skilled rehab facility, please have the facility set up the patient's Home Health Physical Therapy prior to being released. Also, the skilled facility will be responsible for providing the patient with their medications at time of release from the facility to include their pain medication, the muscle relaxants, and their blood thinner medication. If the patient is still at the rehab facility at   time of the two week follow up appointment, the skilled rehab facility will also need to assist the patient in arranging follow up appointment in our office and any transportation needs.  MAKE SURE YOU:  Understand these instructions.  Get help right away if you are not doing well or get worse.    DENTAL ANTIBIOTICS:  In most cases prophylactic antibiotics for Dental procdeures after total joint surgery are not necessary.  Exceptions are as follows:  1. History of prior total joint infection  2. Severely immunocompromised (Organ Transplant, cancer chemotherapy, Rheumatoid biologic meds such as Humera)  3. Poorly controlled diabetes (A1C &gt; 8.0, blood glucose over 200)  If you have one of these conditions, contact your surgeon for an antibiotic prescription, prior to your dental procedure.    Pick up stool softner and laxative for home use following surgery while on pain medications. Do not submerge incision under water. Please use good hand washing techniques while changing dressing each day. May shower starting three days after surgery. Please use a clean towel to pat the incision dry following showers. Continue to use ice for pain and swelling after surgery. Do not use any lotions or creams on the incision until instructed by your surgeon.      Information on my medicine - XARELTO (Rivaroxaban)  Why was Xarelto prescribed for you? Xarelto was prescribed  for you to reduce the risk of blood clots forming after orthopedic surgery. The medical term for these abnormal blood clots is venous thromboembolism (VTE).  What do you need to know about xarelto ? Take your Xarelto ONCE DAILY at the same time every day. You may take it either with or without food.  If you have difficulty swallowing the tablet whole, you may crush it and mix in applesauce just prior to taking your dose.  Take Xarelto exactly as prescribed by your doctor and DO NOT stop taking Xarelto without talking to the doctor who prescribed the medication.  Stopping without other VTE prevention medication to take the place of Xarelto may increase your risk of developing a clot.  After discharge, you should have regular check-up appointments with your healthcare provider that is prescribing your Xarelto.    What do you do if you miss a dose? If you miss a dose, take it as soon as you remember on the same day then continue your regularly scheduled once daily regimen the next day. Do not take two doses of Xarelto on the same day.   Important Safety Information A possible side effect of Xarelto is bleeding. You should call your healthcare provider right away if you experience any of the following: Bleeding from an injury or your nose that does not stop. Unusual colored urine (red or dark brown) or unusual colored stools (red or black). Unusual bruising for unknown reasons. A serious fall or if you hit your head (even if there is no bleeding).  Some medicines may interact with Xarelto and might increase your risk of bleeding while on Xarelto. To help avoid this, consult your healthcare provider or pharmacist prior to using any new prescription or non-prescription medications, including herbals, vitamins, non-steroidal anti-inflammatory drugs (NSAIDs) and supplements.  This website has more information on Xarelto: www.xarelto.com.   

## 2020-10-12 NOTE — Evaluation (Signed)
Physical Therapy Evaluation Patient Details Name: Ashley Holland MRN: [id number] DOB: [birth year] Today's Date: 10/12/2020   History of Present Illness  Patient is 80 y.o. female s/p Lt THA anterior approach on 10/12/20 with PMH significant for OA, GERD, breast cancer, HTN, Rt THA in 2016.   Clinical Impression  LOU IRIGOYEN is a 80 y.o. female POD 0 s/p Lt THA. Patient reports independence with mobility at baseline. Patient is now limited by functional impairments (see PT problem list below) and requires min assist for transfers and gait with RW. Patient was able to ambulate ~6 feet with RW and min assist, pt limited by dizziness that resolved with seated rest and further gait deferred. Patient instructed in exercise to facilitate circulation to manage edema and reduce risk of DVT. Patient will benefit from continued skilled PT interventions to address impairments and progress towards PLOF. Acute PT will follow to progress mobility and stair training in preparation for safe discharge home.     Follow Up Recommendations Follow surgeon's recommendation for DC plan and follow-up therapies    Equipment Recommendations  None recommended by PT    Recommendations for Other Services       Precautions / Restrictions Precautions Precautions: Fall Restrictions Weight Bearing Restrictions: No Other Position/Activity Restrictions: WBAT      Mobility  Bed Mobility Overal bed mobility: Needs Assistance Bed Mobility: Supine to Sit     Supine to sit: Min assist;HOB elevated     General bed mobility comments: Assist for Lt LE to EOB, cues for pt to use bed rail to pivot/raise trunk upright.    Transfers Overall transfer level: Needs assistance Equipment used: Rolling walker (2 wheeled) Transfers: Sit to/from Stand Sit to Stand: Min assist         General transfer comment: cues for safe hand placement/technique with RW, assit for power up from EOB, pt steady once standing and denied  dizziness.  Ambulation/Gait Ambulation/Gait assistance: Min assist Gait Distance (Feet): 6 Feet Assistive device: Rolling walker (2 wheeled) Gait Pattern/deviations: Step-to pattern;Decreased stride length;Decreased weight shift to left Gait velocity: decr   General Gait Details: cues for safe step to pattern and no overt LOB noted. assist to maintain safe proximity to RW. Pt c/o dizzy/woozy sensation and seated rest provided, symtoms resolved.  Stairs            Wheelchair Mobility    Modified Rankin (Stroke Patients Only)       Balance Overall balance assessment: Needs assistance Sitting-balance support: Feet supported Sitting balance-Leahy Scale: Good     Standing balance support: During functional activity;Bilateral upper extremity supported Standing balance-Leahy Scale: Fair                               Pertinent Vitals/Pain Pain Assessment: Faces Faces Pain Scale: Hurts even more Pain Location: Lt hip Pain Descriptors / Indicators: Burning Pain Intervention(s): Limited activity within patient's tolerance;Monitored during session;Repositioned;Ice applied    Home Living Family/patient expects to be discharged to:: Private residence Living Arrangements: Spouse/significant other;Children Available Help at Discharge: Family Type of Home: House Home Access: Stairs to enter Entrance Stairs-Rails: Left Entrance Stairs-Number of Steps: 3 Home Layout: One level Home Equipment: Environmental consultant - 2 wheels;Shower seat Additional Comments: pt's husband, daughter, and grandson are available to help while she recovers    Prior Function Level of Independence: Independent         Comments: pt enjoys going to local  yoga classes at the cancer center in her town.     Hand Dominance   Dominant Hand: Right    Extremity/Trunk Assessment   Upper Extremity Assessment Upper Extremity Assessment: Overall WFL for tasks assessed    Lower Extremity  Assessment Lower Extremity Assessment: Overall WFL for tasks assessed    Cervical / Trunk Assessment Cervical / Trunk Assessment: Normal  Communication   Communication: No difficulties  Cognition Arousal/Alertness: Awake/alert Behavior During Therapy: WFL for tasks assessed/performed Overall Cognitive Status: Within Functional Limits for tasks assessed                                        General Comments      Exercises Total Joint Exercises Ankle Circles/Pumps: AROM;Both;20 reps;Seated   Assessment/Plan    PT Assessment Patient needs continued PT services  PT Problem List Decreased strength;Decreased range of motion;Decreased activity tolerance;Decreased balance;Decreased mobility;Decreased knowledge of use of DME;Decreased knowledge of precautions;Pain       PT Treatment Interventions DME instruction;Gait training;Stair training;Functional mobility training;Therapeutic activities;Therapeutic exercise;Balance training;Patient/family education    PT Goals (Current goals can be found in the Care Plan section)  Acute Rehab PT Goals Patient Stated Goal: get back to walking, yoga classes, and independence PT Goal Formulation: With patient Time For Goal Achievement: 10/19/20 Potential to Achieve Goals: Good    Frequency 7X/week   Barriers to discharge        Co-evaluation               AM-PAC PT "6 Clicks" Mobility  Outcome Measure Help needed turning from your back to your side while in a flat bed without using bedrails?: None Help needed moving from lying on your back to sitting on the side of a flat bed without using bedrails?: A Little Help needed moving to and from a bed to a chair (including a wheelchair)?: A Little Help needed standing up from a chair using your arms (e.g., wheelchair or bedside chair)?: A Little Help needed to walk in hospital room?: A Little Help needed climbing 3-5 steps with a railing? : A Little 6 Click Score: 19     End of Session Equipment Utilized During Treatment: Gait belt Activity Tolerance: Patient tolerated treatment well Patient left: in chair;with call bell/phone within reach;with chair alarm set Nurse Communication: Mobility status PT Visit Diagnosis: Muscle weakness (generalized) (M62.81);Difficulty in walking, not elsewhere classified (R26.2)    Time: 2979-8921 PT Time Calculation (min) (ACUTE ONLY): 24 min   Charges:   PT Evaluation $PT Eval Low Complexity: 1 Low PT Treatments $Therapeutic Activity: 8-22 mins        [name], DPT Acute Rehabilitation Services Office [phone] Pager [phone]   [name] 10/12/2020, 6:06 PM

## 2020-10-12 NOTE — Anesthesia Procedure Notes (Signed)
Spinal  Patient location during procedure: OR Start time: 10/12/2020 9:55 AM End time: 10/12/2020 10:00 AM Reason for block: surgical anesthesia Staffing Anesthesiologist: Lynda Rainwater, MD Preanesthetic Checklist Completed: patient identified, IV checked, site marked, risks and benefits discussed, surgical consent, monitors and equipment checked, pre-op evaluation and timeout performed Spinal Block Patient position: sitting Prep: DuraPrep Patient monitoring: heart rate, cardiac monitor, continuous pulse ox and blood pressure Approach: midline Location: L3-4 Injection technique: single-shot Needle Needle type: Quincke  Needle gauge: 22 G Needle length: 9 cm Assessment Sensory level: T4 Events: CSF return and second provider Additional Notes 1st attempt by CRNA.  2nd attempt successful by Anesthesiologist.

## 2020-10-13 ENCOUNTER — Encounter (HOSPITAL_COMMUNITY): Payer: Self-pay | Admitting: Orthopedic Surgery

## 2020-10-13 DIAGNOSIS — Z79899 Other long term (current) drug therapy: Secondary | ICD-10-CM | POA: Diagnosis not present

## 2020-10-13 DIAGNOSIS — Z96641 Presence of right artificial hip joint: Secondary | ICD-10-CM | POA: Diagnosis not present

## 2020-10-13 DIAGNOSIS — M1612 Unilateral primary osteoarthritis, left hip: Secondary | ICD-10-CM | POA: Diagnosis not present

## 2020-10-13 DIAGNOSIS — Z853 Personal history of malignant neoplasm of breast: Secondary | ICD-10-CM | POA: Diagnosis not present

## 2020-10-13 DIAGNOSIS — I10 Essential (primary) hypertension: Secondary | ICD-10-CM | POA: Diagnosis not present

## 2020-10-13 LAB — BASIC METABOLIC PANEL
Anion gap: 4 — ABNORMAL LOW (ref 5–15)
BUN: 15 mg/dL (ref 8–23)
CO2: 25 mmol/L (ref 22–32)
Calcium: 8.4 mg/dL — ABNORMAL LOW (ref 8.9–10.3)
Chloride: 109 mmol/L (ref 98–111)
Creatinine, Ser: 0.68 mg/dL (ref 0.44–1.00)
GFR, Estimated: >60 mL/min (ref >60)
Glucose, Bld: 165 mg/dL — ABNORMAL HIGH (ref 70–99)
Potassium: 4 mmol/L (ref 3.5–5.1)
Sodium: 138 mmol/L (ref 135–145)

## 2020-10-13 LAB — CBC
HCT: 32.1 % — ABNORMAL LOW (ref 36.0–46.0)
Hemoglobin: 10.8 g/dL — ABNORMAL LOW (ref 12.0–15.0)
MCH: 30.4 pg (ref 26.0–34.0)
MCHC: 33.6 g/dL (ref 30.0–36.0)
MCV: 90.4 fL (ref 80.0–100.0)
Platelets: 215 10*3/uL (ref 150–400)
RBC: 3.55 MIL/uL — ABNORMAL LOW (ref 3.87–5.11)
RDW: 12.7 % (ref 11.5–15.5)
WBC: 12.9 10*3/uL — ABNORMAL HIGH (ref 4.0–10.5)
nRBC: 0 % (ref 0.0–0.2)

## 2020-10-13 MED ORDER — HYDROCODONE-ACETAMINOPHEN 5-325 MG PO TABS: 1.0000 | ORAL_TABLET | Freq: Four times a day (QID) | ORAL | 0 refills | Status: DC | PRN

## 2020-10-13 MED ORDER — METHOCARBAMOL 500 MG PO TABS: 500.0000 mg | ORAL_TABLET | Freq: Four times a day (QID) | ORAL | 0 refills | Status: DC | PRN

## 2020-10-13 MED ORDER — RIVAROXABAN 10 MG PO TABS: 10.0000 mg | ORAL_TABLET | Freq: Every day | ORAL | 0 refills | Status: DC

## 2020-10-13 MED ORDER — TRAMADOL HCL 50 MG PO TABS: 50.0000 mg | ORAL_TABLET | Freq: Four times a day (QID) | ORAL | 0 refills | Status: DC | PRN

## 2020-10-13 NOTE — Progress Notes (Signed)
   Subjective: 1 Day Post-Op Procedure(s) (LRB): TOTAL HIP ARTHROPLASTY ANTERIOR APPROACH (Left) Patient reports pain as mild.   Patient seen in rounds by Dr. Wynelle Link. Patient is well, and has had no acute complaints or problems. No acute overnight events. Denies SOB, chest pain, or calf pain. Ambulated 6 feet with PT yesterday, but was significantly limited due to dizziness that eventually resolved.  We will continue therapy today.   Objective: Vital signs in last 24 hours: Temp:  [96.4 F (35.8 C)-98.2 F (36.8 C)] 98.2 F (36.8 C) (06/23 0523) Pulse Rate:  [52-94] 66 (06/23 0523) Resp:  [12-18] 15 (06/23 0523) BP: (103-145)/(65-85) 122/70 (06/23 0523) SpO2:  [94 %-100 %] 95 % (06/23 0523)  Intake/Output from previous day:  Intake/Output Summary (Last 24 hours) at 10/13/2020 0827 Last data filed at 10/13/2020 0819 Gross per 24 hour  Intake 2603.25 ml  Output 2000 ml  Net 603.25 ml     Intake/Output this shift: Total I/O In: 240 [P.O.:240] Out: -   Labs: Recent Labs    10/13/20 0320  HGB 10.8*   Recent Labs    10/13/20 0320  WBC 12.9*  RBC 3.55*  HCT 32.1*  PLT 215   Recent Labs    10/13/20 0320  NA 138  K 4.0  CL 109  CO2 25  BUN 15  CREATININE 0.68  GLUCOSE 165*  CALCIUM 8.4*   No results for input(s): LABPT, INR in the last 72 hours.  Exam: General - Patient is Alert and Oriented Extremity - Neurologically intact Neurovascular intact Intact pulses distally Dorsiflexion/Plantar flexion intact Dressing - dressing C/D/I Motor Function - intact, moving foot and toes well on exam.   Past Medical History:  Diagnosis Date   Arthritis    Per medical history form dated 11/04/09.past history DJD back   Enlarged thyroid    "was told had nodules" - no swallowing problems   Female bladder prolapse    GERD (gastroesophageal reflux disease) 05/19/2013   Malignant neoplasm of breast (female), unspecified site    right breast cancer. surgery, radiation,  oral meds- Arimidex.   Nontoxic multinodular goiter 11/10/2015   Other and unspecified hyperlipidemia    Rectal prolapse    stool softeners helpful.   Unspecified essential hypertension     Assessment/Plan: 1 Day Post-Op Procedure(s) (LRB): TOTAL HIP ARTHROPLASTY ANTERIOR APPROACH (Left) Active Problems:   S/P total left hip arthroplasty  Estimated body mass index is 26.2 kg/m as calculated from the following:   Height as of this encounter: 5' 4.5" (1.638 m).   Weight as of this encounter: 70.3 kg. Advance diet Up with therapy  DVT Prophylaxis - Xarelto and TED hose Weight bearing as tolerated. Continue therapy.  Plan is to go Home after hospital stay.   Plan for two sessions with PT today, and if meeting goals, will plan for discharge this afternoon.   Patient to follow up in two weeks with Dr. Wynelle Link in clinic.   The PDMP database was reviewed today prior to any opioid medications being prescribed to this patient.   Fenton Foy, [place], PA-C Orthopedic Surgery [phone] 10/13/2020, 8:27 AM

## 2020-10-13 NOTE — Progress Notes (Signed)
Physical Therapy Treatment Patient Details Name: Ashley Holland MRN: [id number] DOB: [birth year] Today's Date: 10/13/2020    History of Present Illness Patient is 79 y.o. female s/p Lt THA anterior approach on 10/12/20 with PMH significant for OA, GERD, breast cancer, HTN, Rt THA in 2016.    PT Comments    Patient with excellent carryover from AM session and min guard/supervision only for transfers and gait with RW. Patient's sister present and provided safe guarding position throughout gait and stair training with instruction from therapist. All questions addressed and pt is at safe level of mobility to return home with assist from family. Acute PT will continue to progress pt throughout stay.   Follow Up Recommendations  Follow surgeon's recommendation for DC plan and follow-up therapies     Equipment Recommendations  None recommended by PT    Recommendations for Other Services       Precautions / Restrictions Precautions Precautions: Fall Restrictions Weight Bearing Restrictions: No Other Position/Activity Restrictions: WBAT    Mobility  Bed Mobility               General bed mobility comments: pt OOB in recliner.    Transfers Overall transfer level: Needs assistance Equipment used: Rolling walker (2 wheeled) Transfers: Sit to/from Stand Sit to Stand: Supervision         General transfer comment: superivsion for safety, no assist required to rise and pt steady  Ambulation/Gait Ambulation/Gait assistance: Min guard;Supervision Gait Distance (Feet): 140 Feet Assistive device: Rolling walker (2 wheeled) Gait Pattern/deviations: Step-to pattern;Decreased stride length;Decreased weight shift to left;Step-through pattern Gait velocity: decr   General Gait Details: pt with good carryover from AM session, maintained safe proximity to RW throughout, no overt LOB noted. Pt's sister provided safe guarding position with instruction and supervision from  therapist.   Stairs Stairs: Yes Stairs assistance: Min guard;Supervision Stair Management: One rail Left;Step to pattern;Sideways Number of Stairs: 2 General stair comments: cues for step sequencing "up with good, down with bad" and for safe guarding for pt's sister to provide during stair mobility.   Wheelchair Mobility    Modified Rankin (Stroke Patients Only)       Balance Overall balance assessment: Needs assistance Sitting-balance support: Feet supported Sitting balance-Leahy Scale: Good     Standing balance support: During functional activity;Bilateral upper extremity supported Standing balance-Leahy Scale: Fair                              Cognition Arousal/Alertness: Awake/alert Behavior During Therapy: WFL for tasks assessed/performed Overall Cognitive Status: Within Functional Limits for tasks assessed                                        Exercises      General Comments        Pertinent Vitals/Pain Pain Assessment: 0-10 Pain Score: 4  Pain Location: Lt hip Pain Descriptors / Indicators: Burning Pain Intervention(s): Limited activity within patient's tolerance;Monitored during session;Repositioned;Premedicated before session    Home Living                      Prior Function            PT Goals (current goals can now be found in the care plan section) Acute Rehab PT Goals Patient Stated Goal: get back to walking, yoga  classes, and independence PT Goal Formulation: With patient Time For Goal Achievement: 10/19/20 Potential to Achieve Goals: Good Progress towards PT goals: Progressing toward goals    Frequency    7X/week      PT Plan Current plan remains appropriate    Co-evaluation              AM-PAC PT "6 Clicks" Mobility   Outcome Measure  Help needed turning from your back to your side while in a flat bed without using bedrails?: None Help needed moving from lying on your back to  sitting on the side of a flat bed without using bedrails?: A Little Help needed moving to and from a bed to a chair (including a wheelchair)?: A Little Help needed standing up from a chair using your arms (e.g., wheelchair or bedside chair)?: A Little Help needed to walk in hospital room?: A Little Help needed climbing 3-5 steps with a railing? : A Little 6 Click Score: 19    End of Session Equipment Utilized During Treatment: Gait belt Activity Tolerance: Patient tolerated treatment well Patient left: in chair;with call bell/phone within reach;with chair alarm set Nurse Communication: Mobility status PT Visit Diagnosis: Muscle weakness (generalized) (M62.81);Difficulty in walking, not elsewhere classified (R26.2)     Time: 8657-8469 PT Time Calculation (min) (ACUTE ONLY): 10 min  Charges:  $Gait Training: 8-22 mins                     [name], DPT Acute Rehabilitation Services Office [phone] Pager [phone]    [name] 10/13/2020, 6:40 PM

## 2020-10-13 NOTE — Progress Notes (Signed)
Physical Therapy Treatment Patient Details Name: Ashley Holland MRN: [id number] DOB: 03-31-1941 Today's Date: 10/13/2020    History of Present Illness Patient is 80 y.o. female s/p Lt THA anterior approach on 10/12/20 with PMH significant for OA, GERD, breast cancer, HTN, Rt THA in 2016.    PT Comments    Patient progressing well POD1 and demonstrated safe management of RW for transfers and gait with RW. She ambulated ~200' ans progressed to step through pattern with min guard. Pt instructed on HEP for strengthening and ROM. She will benefit from follow up session for family training with gait and stair mobility.      Follow Up Recommendations  Follow surgeon's recommendation for DC plan and follow-up therapies     Equipment Recommendations  None recommended by PT    Recommendations for Other Services       Precautions / Restrictions Precautions Precautions: Fall Restrictions Weight Bearing Restrictions: No Other Position/Activity Restrictions: WBAT    Mobility  Bed Mobility               General bed mobility comments: pt OOB in recliner.    Transfers Overall transfer level: Needs assistance Equipment used: Rolling walker (2 wheeled) Transfers: Sit to/from Stand Sit to Stand: Min guard         General transfer comment: pt with good recall for hand placement for power up, guarding for safety with rise.  Ambulation/Gait Ambulation/Gait assistance: Min assist;Min guard Gait Distance (Feet): 200 Feet Assistive device: Rolling walker (2 wheeled) Gait Pattern/deviations: Step-to pattern;Decreased stride length;Decreased weight shift to left;Step-through pattern Gait velocity: decr   General Gait Details: cues for proximity to RW. pt progressed from step to to step through pattern and assist needed to keep walker close.   Stairs             Wheelchair Mobility    Modified Rankin (Stroke Patients Only)       Balance Overall balance assessment:  Needs assistance Sitting-balance support: Feet supported Sitting balance-Leahy Scale: Good     Standing balance support: During functional activity;Bilateral upper extremity supported Standing balance-Leahy Scale: Fair                              Cognition Arousal/Alertness: Awake/alert Behavior During Therapy: WFL for tasks assessed/performed Overall Cognitive Status: Within Functional Limits for tasks assessed                                        Exercises Total Joint Exercises Ankle Circles/Pumps: AROM;Both;20 reps;Seated Quad Sets: AROM;Left;5 reps;Seated Short Arc Quad: AROM;Left;5 reps;Seated Heel Slides: AROM;Left;5 reps;Seated Hip ABduction/ADduction: AROM;Left;5 reps;Seated    General Comments        Pertinent Vitals/Pain Pain Assessment: 0-10 Pain Score: 6  Pain Location: Lt hip Pain Descriptors / Indicators: Burning Pain Intervention(s): Limited activity within patient's tolerance;Monitored during session;Repositioned;Ice applied    Home Living                      Prior Function            PT Goals (current goals can now be found in the care plan section) Acute Rehab PT Goals Patient Stated Goal: get back to walking, yoga classes, and independence PT Goal Formulation: With patient Time For Goal Achievement: 10/19/20 Potential to Achieve Goals: Good Progress towards  PT goals: Progressing toward goals    Frequency    7X/week      PT Plan Current plan remains appropriate    Co-evaluation              AM-PAC PT "6 Clicks" Mobility   Outcome Measure  Help needed turning from your back to your side while in a flat bed without using bedrails?: None Help needed moving from lying on your back to sitting on the side of a flat bed without using bedrails?: A Little Help needed moving to and from a bed to a chair (including a wheelchair)?: A Little Help needed standing up from a chair using your arms  (e.g., wheelchair or bedside chair)?: A Little Help needed to walk in hospital room?: A Little Help needed climbing 3-5 steps with a railing? : A Little 6 Click Score: 19    End of Session Equipment Utilized During Treatment: Gait belt Activity Tolerance: Patient tolerated treatment well Patient left: in chair;with call bell/phone within reach;with chair alarm set Nurse Communication: Mobility status PT Visit Diagnosis: Muscle weakness (generalized) (M62.81);Difficulty in walking, not elsewhere classified (R26.2)       10/13/20 1100  PT Time Calculation  PT Start Time (ACUTE ONLY) 1054  PT Stop Time (ACUTE ONLY) 1112  PT Time Calculation (min) (ACUTE ONLY) 18 min  PT General Charges  $$ ACUTE PT VISIT 1 Visit  PT Treatments  $Gait Training 8-22 mins              [name], DPT Acute Rehabilitation Services Office [phone] Pager [phone]    [name] 10/13/2020, 6:36 PM

## 2020-10-13 NOTE — TOC Transition Note (Signed)
Transition of Care [facility]) - CM/SW Discharge Note  Patient Details  Name: AVONNE BERKERY MRN: [id number] Date of Birth: 04-07-1941  [facility] CM/SW Contact:  Sherie Don, LCSW Phone Number: 10/13/2020, 9:22 AM  Clinical Narrative: Patient is expected to discharge after working with PT. CSW met with patient to confirm discharge plan. Patient will discharge home with a home exercise program (HEP). Patient has a rolling walker at home, but declined a 3N1. TOC signing off.  Final next level of care: Home/Self Care Barriers to Discharge: No Barriers Identified  Patient Goals and CMS Choice Patient states their goals for this hospitalization and ongoing recovery are:: Discharge home with HEP CMS Medicare.gov Compare Post Acute Care list provided to:: Patient Choice offered to / list presented to : NA  Discharge Plan and Services        DME Arranged: N/A DME Agency: NA HH Arranged: NA [place] Agency: NA  Readmission Risk Interventions No flowsheet data found.

## 2020-10-19 ENCOUNTER — Ambulatory Visit (INDEPENDENT_AMBULATORY_CARE_PROVIDER_SITE_OTHER): Payer: Medicare Other | Admitting: Licensed Clinical Social Worker

## 2020-10-19 DIAGNOSIS — I10 Essential (primary) hypertension: Secondary | ICD-10-CM

## 2020-10-19 DIAGNOSIS — E7800 Pure hypercholesterolemia, unspecified: Secondary | ICD-10-CM | POA: Diagnosis not present

## 2020-10-19 DIAGNOSIS — M1612 Unilateral primary osteoarthritis, left hip: Secondary | ICD-10-CM

## 2020-10-19 DIAGNOSIS — D508 Other iron deficiency anemias: Secondary | ICD-10-CM

## 2020-10-19 DIAGNOSIS — Z636 Dependent relative needing care at home: Secondary | ICD-10-CM

## 2020-10-19 NOTE — Patient Instructions (Signed)
Visit Information  PATIENT GOALS:  Goals Addressed             This Visit's Progress    Manage My Emotions; Manage anxiety and stress issues faced       Timeframe:  Short-Term Goal Priority:  Medium Progress: On Track Start Date:             10/19/20                Expected End Date:         01/16/21              Follow Up Date 12/01/20   Manage Emotions:  Manage anxiety and stress issues faced    Why is this important?   When you are stressed, down or upset, your body reacts too.  For example, your blood pressure may get higher; you may have a headache or stomachache.  When your emotions get the best of you, your body's ability to fight off cold and flu gets weak.  These steps will help you manage your emotions.     Patient Self Care Activities :   Patient is able to do daily ADLs as needed Patient is able to attend scheduled medical appointments Patient is able to take medications as prescribed.   Patient Deficits Anxiety issues Stress issues  Patient Goals. In next 30 days, patient will Attend scheduled medical appointments Take medications as prescribed Call RNCM or LCSW as needed for CCM support  Follow Up Plan:  LCSW to call client on 12/01/20    [name].Jeremie Giangrande MSW, LCSW Licensed Clinical Social Worker [facility] Care Management [phone]

## 2020-10-19 NOTE — Chronic Care Management (AMB) (Signed)
Chronic Care Management    Clinical Social Work Note  10/19/2020 Name: Ashley Holland MRN: [id number] DOB: 1940/09/30  Ashley Holland is a 80 y.o. year old female who is a primary care patient of [name], DO. The CCM team was consulted to assist the patient with chronic disease management and/or care coordination needs related to: [employer] .   Engaged with patient by telephone for follow up visit in response to provider referral for social work chronic care management and care coordination services.   Consent to Services:  The patient was given information about Chronic Care Management services, agreed to services, and gave verbal consent prior to initiation of services.  Please see initial visit note for detailed documentation.   Patient agreed to services and consent obtained.   Assessment: Review of patient past medical history, allergies, medications, and health status, including review of relevant consultants reports was performed today as part of a comprehensive evaluation and provision of chronic care management and care coordination services.     SDOH (Social Determinants of Health) assessments and interventions performed:  Client did not have any indicators of depression or depressed mood. She is challenges with some anxiety issues in managing the needs of her spouse. She is taking medications as prescribed . She is aware that she can call RNCM or LCSW as needed for CCM support.  She is in a positive mood at present and is feeling that her hip surgery recovery is going well at this time  Advanced Directives Status: See [employer] application for related entries.  CCM Care Plan  No Known Allergies  Outpatient Encounter Medications as of 10/19/2020  Medication Sig   amLODipine (NORVASC) 5 MG tablet TAKE 1 TABLET BY MOUTH EVERY DAY   ARTIFICIAL TEAR OP Place 1 drop into both eyes daily as needed (dry eyes).   diphenhydrAMINE (BENADRYL) 25 MG tablet Take 25 mg by  mouth daily as needed for itching.   hydrochlorothiazide (MICROZIDE) 12.5 MG capsule TAKE 1 CAPSULE BY MOUTH EVERY DAY   HYDROcodone-acetaminophen (NORCO/VICODIN) 5-325 MG tablet Take 1-2 tablets by mouth every 6 (six) hours as needed for severe pain.   methocarbamol (ROBAXIN) 500 MG tablet Take 1 tablet (500 mg total) by mouth every 6 (six) hours as needed for muscle spasms.   rivaroxaban (XARELTO) 10 MG TABS tablet Take 1 tablet (10 mg total) by mouth daily with breakfast.   traMADol (ULTRAM) 50 MG tablet Take 1-2 tablets (50-100 mg total) by mouth every 6 (six) hours as needed for moderate pain.   No facility-administered encounter medications on file as of 10/19/2020.    Patient Active Problem List   Diagnosis Date Noted   S/P total left hip arthroplasty 10/12/2020   Iron deficiency anemia secondary to inadequate dietary iron intake 06/18/2018   Colon polyp 03/07/2015   OA (osteoarthritis) of hip 07/26/2014   Autoimmune thyroiditis 02/21/2014   Family history of thyroid cancer 02/21/2014   Malignant neoplasm of right breast ([place]) 12/10/2011   Arthritis/joint pain 09/22/2010   Menopause 09/22/2010   HTN (hypertension) 08/21/2010   Precordial pain    Hyperlipidemia     Conditions to be addressed/monitored: Monitor client management of anxiety and stress issues faced   Care Plan : LCSW Care Plan  Updates made by [name], LCSW since 10/19/2020 12:00 AM     Problem: Emotional Distress      Goal: Emotional Health Supported; Manage anxiety and stress issues faced   Start Date: 10/19/2020  Expected End Date: 01/16/2021  This Visit's Progress: On track  Priority: Medium  Note:   CARE PLAN ENTRY (see longitudinal plan of care for additional care plan information)  Current Barriers:  Mental Health challenges in managing anxiety and stress issues faced   Challenges in managing the care needs of her spouse  Clinical Social Work Goal(s):  Over the next 30 days, patient  will work with SW to manage anxiety and stress issues faced.  Patient will call LCSW or RNCM as needed in next 30 days for CCM support  Interventions:  1:1 collaboration with Dr. [name] regarding development and update of comprehensive plan of care as evidenced by provider attestation and co-signature  Talked with client about her current needs Talked with [place]enda about her recent hip surgery Talked with client about pain issues of client Talked with client about her recent completion of physical therapy sessions (she is doing a Home Exercise Program) Talked with client about sleeping challenges Talked with client about mobility of client (she is now using a cane to help her walk) Talked with client about family support (support from daughter, from her cousin, and from her grandson) Talked with client about meal provision  Talked with client about upcoming medical appointments Talked with client about her upcoming appointment on October 25, 2020 with surgeon Talked with client about medication procurement Talked with client about transport needs of client (family members are helping her with transport needs at present) Encouraged client to call RNCM as needed for nursing support Talked with client about her home health needs Talked with client about ADLs completion (she said she has a shower bench which she uses when taking a shower) Talked with client about vision of client Talked with client about sleeping issues of client  Patient Self Care Activities :   Patient is able to do daily ADLs as needed Patient is able to attend scheduled medical appointments Patient is able to take medications as prescribed.   Patient Deficits Anxiety issues Stress issues  Patient Goals. In next 30 days, patient will Attend scheduled medical appointments Take medications as prescribed Call RNCM or LCSW as needed for CCM support  Follow Up Plan:  LCSW to call client on 12/01/20     [name].Ger Ringenberg MSW, LCSW Licensed Clinical Social Worker [facility] Care Management [phone]

## 2020-10-24 ENCOUNTER — Other Ambulatory Visit: Payer: Self-pay | Admitting: Family Medicine

## 2020-10-24 DIAGNOSIS — I10 Essential (primary) hypertension: Secondary | ICD-10-CM

## 2020-10-26 NOTE — Discharge Summary (Signed)
Physician Discharge Summary   Patient ID: Ashley Holland MRN: [id number] DOB/AGE: 1940/09/20 80 y.o.  Admit date: 10/12/2020 Discharge date: 10/13/2020  Primary Diagnosis:  s/p L THA  Admission Diagnoses:  Past Medical History:  Diagnosis Date   Arthritis    Per medical history form dated 11/04/09.past history DJD back   Enlarged thyroid    "was told had nodules" - no swallowing problems   Female bladder prolapse    GERD (gastroesophageal reflux disease) 05/19/2013   Malignant neoplasm of breast (female), unspecified site    right breast cancer. surgery, radiation, oral meds- Arimidex.   Nontoxic multinodular goiter 11/10/2015   Other and unspecified hyperlipidemia    Rectal prolapse    stool softeners helpful.   Unspecified essential hypertension    Discharge Diagnoses:   Active Problems:   S/P total left hip arthroplasty  Estimated body mass index is 26.2 kg/m as calculated from the following:   Height as of this encounter: 5' 4.5" (1.638 m).   Weight as of this encounter: 70.3 kg.  Procedure:  Procedure(s) (LRB): TOTAL HIP ARTHROPLASTY ANTERIOR APPROACH (Left)   Consults: None  HPI: Ashley Holland is a 80 y.o. female who has advanced end- stage arthritis of their Left  hip with progressively worsening pain and dysfunction.The patient has failed nonoperative management and presents for left total hip arthroplasty.  Laboratory Data: Admission on 10/12/2020, Discharged on 10/13/2020  Component Date Value Ref Range Status   WBC 10/13/2020 12.9 (A) 4.0 - 10.5 K/uL Final   RBC 10/13/2020 3.55 (A) 3.87 - 5.11 MIL/uL Final   Hemoglobin 10/13/2020 10.8 (A) 12.0 - 15.0 g/dL Final   HCT 10/13/2020 32.1 (A) 36.0 - 46.0 % Final   MCV 10/13/2020 90.4  80.0 - 100.0 fL Final   MCH 10/13/2020 30.4  26.0 - 34.0 pg Final   MCHC 10/13/2020 33.6  30.0 - 36.0 g/dL Final   RDW 10/13/2020 12.7  11.5 - 15.5 % Final   Platelets 10/13/2020 215  150 - 400 K/uL Final   nRBC 10/13/2020  0.0  0.0 - 0.2 % Final   Performed at [facility], [place] [address] [place], [place] [address]   Sodium 10/13/2020 138  135 - 145 mmol/L Final   Potassium 10/13/2020 4.0  3.5 - 5.1 mmol/L Final   Chloride 10/13/2020 109  98 - 111 mmol/L Final   CO2 10/13/2020 25  22 - 32 mmol/L Final   Glucose, Bld 10/13/2020 165 (A) 70 - 99 mg/dL Final   Glucose reference range applies only to samples taken after fasting for at least 8 hours.   BUN 10/13/2020 15  8 - 23 mg/dL Final   Creatinine, Ser 10/13/2020 0.68  0.44 - 1.00 mg/dL Final   Calcium 10/13/2020 8.4 (A) 8.9 - 10.3 mg/dL Final   GFR, Estimated 10/13/2020 >60  >60 mL/min Final   Comment: (NOTE) Calculated using the CKD-EPI Creatinine Equation (2021)    Anion gap 10/13/2020 4 (A) 5 - 15 Final   Performed at [facility], [place] [address], [place], [place] [address]  Hospital Outpatient Visit on 10/10/2020  Component Date Value Ref Range Status   SARS Coronavirus 2 10/10/2020 NEGATIVE  NEGATIVE Final   Comment: (NOTE) SARS-CoV-2 target nucleic acids are NOT DETECTED.  The SARS-CoV-2 RNA is generally detectable in upper and lower respiratory specimens during the acute phase of infection. Negative results do not preclude SARS-CoV-2 infection, do not rule out co-infections with other pathogens, and  should not be used as the sole basis for treatment or other patient management decisions. Negative results must be combined with clinical observations, patient history, and epidemiological information. The expected result is Negative.  Fact Sheet for Patients: SugarRoll.be  Fact Sheet for Healthcare Providers: https://www.woods-mathews.com/  This test is not yet approved or cleared by the [place] FDA and  has been authorized for detection and/or diagnosis of SARS-CoV-2 by FDA under an Emergency Use Authorization (EUA). This EUA will remain  in effect (meaning  this test can be used) for the duration of the COVID-19 declaration under Se                          ction 564(b)(1) of the Act, 21 U.S.C. section 360bbb-3(b)(1), unless the authorization is terminated or revoked sooner.  Performed at [facility], [place] [address] [place], [place] [address]   Hospital Outpatient Visit on 09/30/2020  Component Date Value Ref Range Status   MRSA, PCR 09/30/2020 NEGATIVE  NEGATIVE Final   Staphylococcus aureus 09/30/2020 NEGATIVE  NEGATIVE Final   Comment: (NOTE) The Xpert SA Assay (FDA approved for NASAL specimens in patients 40 years of age and older), is one component of a comprehensive surveillance program. It is not intended to diagnose infection nor to guide or monitor treatment. Performed at [facility], [place] [address] [place], [place] [address]    WBC 09/30/2020 7.2  4.0 - 10.5 K/uL Final   RBC 09/30/2020 4.74  3.87 - 5.11 MIL/uL Final   Hemoglobin 09/30/2020 14.0  12.0 - 15.0 g/dL Final   HCT 09/30/2020 42.2  36.0 - 46.0 % Final   MCV 09/30/2020 89.0  80.0 - 100.0 fL Final   MCH 09/30/2020 29.5  26.0 - 34.0 pg Final   MCHC 09/30/2020 33.2  30.0 - 36.0 g/dL Final   RDW 09/30/2020 12.8  11.5 - 15.5 % Final   Platelets 09/30/2020 302  150 - 400 K/uL Final   nRBC 09/30/2020 0.0  0.0 - 0.2 % Final   Performed at [facility], [place] [address] [place], [place] [address]   Sodium 09/30/2020 141  135 - 145 mmol/L Final   Potassium 09/30/2020 4.1  3.5 - 5.1 mmol/L Final   Chloride 09/30/2020 105  98 - 111 mmol/L Final   CO2 09/30/2020 26  22 - 32 mmol/L Final   Glucose, Bld 09/30/2020 97  70 - 99 mg/dL Final   Glucose reference range applies only to samples taken after fasting for at least 8 hours.   BUN 09/30/2020 22  8 - 23 mg/dL Final   Creatinine, Ser 09/30/2020 0.60  0.44 - 1.00 mg/dL Final   Calcium 09/30/2020 9.5  8.9 - 10.3 mg/dL Final   Total Protein 09/30/2020 7.2  6.5 - 8.1 g/dL Final    Albumin 09/30/2020 4.0  3.5 - 5.0 g/dL Final   AST 09/30/2020 28  15 - 41 U/L Final   ALT 09/30/2020 16  0 - 44 U/L Final   Alkaline Phosphatase 09/30/2020 81  38 - 126 U/L Final   Total Bilirubin 09/30/2020 0.2 (A) 0.3 - 1.2 mg/dL Final   GFR, Estimated 09/30/2020 >60  >60 mL/min Final   Comment: (NOTE) Calculated using the CKD-EPI Creatinine Equation (2021)    Anion gap 09/30/2020 10  5 - 15 Final   Performed at [facility], [place] [address] [place], [place] [address]   Prothrombin Time  09/30/2020 13.7  11.4 - 15.2 seconds Final   INR 09/30/2020 1.1  0.8 - 1.2 Final   Comment: (NOTE) INR goal varies based on device and disease states. Performed at [facility], [place] [address] [place], [place] [address]    aPTT 09/30/2020 40 (A) 24 - 36 seconds Final   Comment:        IF BASELINE aPTT IS ELEVATED, SUGGEST PATIENT RISK ASSESSMENT BE USED TO DETERMINE APPROPRIATE ANTICOAGULANT THERAPY. Performed at [facility], [place] [address] [place], [place] [address]    ABO/RH(D) 09/30/2020 O POS   Final   Antibody Screen 09/30/2020 NEG   Final   Sample Expiration 09/30/2020 10/14/2020,2359   Final   Extend sample reason 09/30/2020    Final                   Value:NO TRANSFUSIONS OR PREGNANCY IN THE PAST 3 MONTHS Performed at [place] [address], [place],  [address]      X-Rays:DG Pelvis Portable  Result Date: 10/12/2020 CLINICAL DATA:  Postop total left hip arthroplasty. EXAM: PORTABLE PELVIS 1-2 VIEWS COMPARISON:  Radiographs 06/17/2020 FINDINGS: The left total hip arthroplasty components are well seated. No complicating features are identified. The right hip prosthesis is intact. The visualized bony pelvis is intact. IMPRESSION: Well seated components of a total left hip arthroplasty without complicating features. Electronically Signed   By: Marijo Sanes M.D.   On: 10/12/2020 12:44   DG C-Arm  1-60 Min-No Report  Result Date: 10/12/2020 Fluoroscopy was utilized by the requesting physician.  No radiographic interpretation.   DG HIP OPERATIVE UNILAT W OR W/O PELVIS LEFT  Result Date: 10/12/2020 CLINICAL DATA:  Left anterior hip. EXAM: OPERATIVE LEFT HIP (WITH PELVIS IF PERFORMED) 4 VIEWS TECHNIQUE: Fluoroscopic spot image(s) were submitted for interpretation post-operatively. COMPARISON:  June 17, 2020. FINDINGS: Fluoro time: 6 seconds. Four C-arm fluoroscopic images were obtained intraoperatively and submitted for post operative interpretation. These images demonstrate postsurgical changes of left total hip arthroplasty. No unexpected findings. Partially imaged prior right total hip arthroplasty. Please see the performing provider's procedural report for further detail. IMPRESSION: Left total hip arthroplasty. Electronically Signed   By: Margaretha Sheffield MD   On: 10/12/2020 14:18    EKG: Orders placed or performed in visit on 08/31/20   EKG 12-Lead     Hospital Course: Ashley Holland is a 80 y.o. who was admitted to [facility]. They were brought to the operating room on 10/12/2020 and underwent Procedure(s): [place].  Patient tolerated the procedure well and was later transferred to the recovery room and then to the orthopaedic floor for postoperative care. They were given PO and IV analgesics for pain control following their surgery. They were given 24 hours of postoperative antibiotics of  Anti-infectives (From admission, onward)    Start     Dose/Rate Route Frequency Ordered Stop   10/12/20 1600  ceFAZolin (ANCEF) IVPB 2g/100 mL premix        2 g 200 mL/hr over 30 Minutes Intravenous Every 6 hours 10/12/20 1406 10/12/20 2312   10/12/20 0715  ceFAZolin (ANCEF) IVPB 2g/100 mL premix        2 g 200 mL/hr over 30 Minutes Intravenous On call to O.R. 10/12/20 8921 10/12/20 1031      and started on DVT prophylaxis in the form of Xarelto  and TED hose.   PT and OT were ordered  for total joint protocol. Discharge planning consulted to help with postop disposition and equipment needs.  Patient had an uneventful night on the evening of surgery. They started to get up OOB with therapy on 10/13/20. Pt was seen during rounds and was ready to go home pending progress with therapy. She worked with therapy on POD #1 and was meeting goals. Pt was discharged to home later that day in stable condition.  Diet: Regular diet Activity: WBAT Follow-up: in two weeks Disposition: Home Discharged Condition: good   Discharge Instructions     Call MD / Call 911   Complete by: As directed    If you experience chest pain or shortness of breath, CALL 911 and be transported to the hospital emergency room.  If you develope a fever above 101 F, pus (white drainage) or increased drainage or redness at the wound, or calf pain, call your surgeon's office.   Change dressing   Complete by: As directed    You have an adhesive waterproof bandage over the incision. Leave this in place until your first follow-up appointment. Once you remove this you will not need to place another bandage.   Constipation Prevention   Complete by: As directed    Drink plenty of fluids.  Prune juice may be helpful.  You may use a stool softener, such as Colace (over the counter) 100 mg twice a day.  Use MiraLax (over the counter) for constipation as needed.   Diet - low sodium heart healthy   Complete by: As directed    Do not sit on low chairs, stoools or toilet seats, as it may be difficult to get up from low surfaces   Complete by: As directed    Driving restrictions   Complete by: As directed    No driving for two weeks   Post-operative opioid taper instructions:   Complete by: As directed    POST-OPERATIVE OPIOID TAPER INSTRUCTIONS: It is important to wean off of your opioid medication as soon as possible. If you do not need pain medication after your surgery it is ok to  stop day one. Opioids include: Codeine, Hydrocodone(Norco, Vicodin), Oxycodone(Percocet, oxycontin) and hydromorphone amongst others.  Long term and even short term use of opiods can cause: Increased pain response Dependence Constipation Depression Respiratory depression And more.  Withdrawal symptoms can include Flu like symptoms Nausea, vomiting And more Techniques to manage these symptoms Hydrate well Eat regular healthy meals Stay active Use relaxation techniques(deep breathing, meditating, yoga) Do Not substitute Alcohol to help with tapering If you have been on opioids for less than two weeks and do not have pain than it is ok to stop all together.  Plan to wean off of opioids This plan should start within one week post op of your joint replacement. Maintain the same interval or time between taking each dose and first decrease the dose.  Cut the total daily intake of opioids by one tablet each day Next start to increase the time between doses. The last dose that should be eliminated is the evening dose.      TED hose   Complete by: As directed    Use stockings (TED hose) for three weeks on both leg(s).  You may remove them at night for sleeping.   Weight bearing as tolerated   Complete by: As directed       Allergies as of 10/13/2020   No Known Allergies      Medication List  STOP taking these medications    CALCIUM + D PO   ibuprofen 200 MG tablet Commonly known as: ADVIL   multivitamin with minerals Tabs tablet       TAKE these medications    ARTIFICIAL TEAR OP Place 1 drop into both eyes daily as needed (dry eyes).   diphenhydrAMINE 25 MG tablet Commonly known as: BENADRYL Take 25 mg by mouth daily as needed for itching.   hydrochlorothiazide 12.5 MG capsule Commonly known as: MICROZIDE TAKE 1 CAPSULE BY MOUTH EVERY DAY What changed: how much to take   HYDROcodone-acetaminophen 5-325 MG tablet Commonly known as: NORCO/VICODIN Take  1-2 tablets by mouth every 6 (six) hours as needed for severe pain.   methocarbamol 500 MG tablet Commonly known as: ROBAXIN Take 1 tablet (500 mg total) by mouth every 6 (six) hours as needed for muscle spasms.   rivaroxaban 10 MG Tabs tablet Commonly known as: XARELTO Take 1 tablet (10 mg total) by mouth daily with breakfast.   traMADol 50 MG tablet Commonly known as: ULTRAM Take 1-2 tablets (50-100 mg total) by mouth every 6 (six) hours as needed for moderate pain.               Discharge Care Instructions  (From admission, onward)           Start     Ordered   10/13/20 0000  Weight bearing as tolerated        10/13/20 0832   10/13/20 0000  Change dressing       Comments: You have an adhesive waterproof bandage over the incision. Leave this in place until your first follow-up appointment. Once you remove this you will not need to place another bandage.   10/13/20 0034            Follow-up Information     Gaynelle Arabian, MD. Go on 10/25/2020.   Specialty: Orthopedic Surgery Why: You are scheduled for post-operative appointment on 10-25-20 at 1:00 pm Contact information: [address] [place] [address] [phone]                 Signed: Fenton Foy, MBA, PA-C Orthopedic Surgery 10/26/2020, 4:47 PM

## 2020-11-03 ENCOUNTER — Other Ambulatory Visit: Payer: Self-pay | Admitting: Family Medicine

## 2020-11-03 DIAGNOSIS — I10 Essential (primary) hypertension: Secondary | ICD-10-CM

## 2020-11-08 DIAGNOSIS — Z1231 Encounter for screening mammogram for malignant neoplasm of breast: Secondary | ICD-10-CM | POA: Diagnosis not present

## 2020-11-15 DIAGNOSIS — Z471 Aftercare following joint replacement surgery: Secondary | ICD-10-CM | POA: Diagnosis not present

## 2020-11-15 DIAGNOSIS — Z96642 Presence of left artificial hip joint: Secondary | ICD-10-CM | POA: Diagnosis not present

## 2020-12-01 ENCOUNTER — Ambulatory Visit (INDEPENDENT_AMBULATORY_CARE_PROVIDER_SITE_OTHER): Payer: Medicare Other | Admitting: Licensed Clinical Social Worker

## 2020-12-01 ENCOUNTER — Encounter: Payer: Self-pay | Admitting: Licensed Clinical Social Worker

## 2020-12-01 DIAGNOSIS — E7800 Pure hypercholesterolemia, unspecified: Secondary | ICD-10-CM

## 2020-12-01 DIAGNOSIS — M1612 Unilateral primary osteoarthritis, left hip: Secondary | ICD-10-CM

## 2020-12-01 DIAGNOSIS — I10 Essential (primary) hypertension: Secondary | ICD-10-CM

## 2020-12-01 DIAGNOSIS — D508 Other iron deficiency anemias: Secondary | ICD-10-CM

## 2020-12-01 DIAGNOSIS — Z636 Dependent relative needing care at home: Secondary | ICD-10-CM

## 2020-12-01 NOTE — Patient Instructions (Signed)
Visit Information  PATIENT GOALS:  Goals Addressed             This Visit's Progress    Manage My Emotions; Manage anxiety and stress issues faced       Timeframe:  Short-Term Goal Priority:  Medium Progress: On Track Start Date:             12/01/20     Expected End Date:      03/01/21              Follow Up Date 01/10/21   Manage Emotions:  Manage anxiety and stress issues faced    Why is this important?   When you are stressed, down or upset, your body reacts too.  For example, your blood pressure may get higher; you may have a headache or stomachache.  When your emotions get the best of you, your body's ability to fight off cold and flu gets weak.  These steps will help you manage your emotions.     Patient Self Care Activities :   Patient is able to do daily ADLs as needed Patient is able to attend scheduled medical appointments Patient is able to take medications as prescribed.   Patient Deficits Anxiety issues Stress issues  Patient Goals. In next 30 days, patient will Attend scheduled medical appointments Take medications as prescribed Call RNCM or LCSW as needed for CCM support  Follow Up Plan:  LCSW to call client on 01/10/21    [name].Jerett Odonohue MSW, LCSW Licensed Clinical Social Worker [facility] Care Management [phone]

## 2020-12-01 NOTE — Chronic Care Management (AMB) (Signed)
Chronic Care Management    Clinical Social Work Note  12/01/2020 Name: Ashley Holland MRN: [id number] DOB: 03/24/1941  Ashley Holland is a 80 y.o. year old female who is a primary care patient of Ashley Norlander, DO. The CCM team was consulted to assist the patient with chronic disease management and/or care coordination needs related to: [employer] .   Engaged with patient by telephone for follow up visit in response to provider referral for social work chronic care management and care coordination services.   Consent to Services:  The patient was given information about Chronic Care Management services, agreed to services, and gave verbal consent prior to initiation of services.  Please see initial visit note for detailed documentation.   Patient agreed to services and consent obtained.   Assessment: Review of patient past medical history, allergies, medications, and health status, including review of relevant consultants reports was performed today as part of a comprehensive evaluation and provision of chronic care management and care coordination services.     SDOH (Social Determinants of Health) assessments and interventions performed:  SDOH Interventions    Flowsheet Row Most Recent Value  SDOH Interventions   Physical Activity Interventions Other (Comments)  [recovering from hip surgery]  Depression Interventions/Treatment  --  [informed client of LCSW support and of RNCM support]        Advanced Directives Status: See Vynca application for related entries.  CCM Care Plan  No Known Allergies  Outpatient Encounter Medications as of 12/01/2020  Medication Sig   amLODipine (NORVASC) 5 MG tablet TAKE 1 TABLET BY MOUTH EVERY DAY   ARTIFICIAL TEAR OP Place 1 drop into both eyes daily as needed (dry eyes).   diphenhydrAMINE (BENADRYL) 25 MG tablet Take 25 mg by mouth daily as needed for itching.   hydrochlorothiazide (MICROZIDE) 12.5 MG capsule TAKE 1 CAPSULE BY  MOUTH EVERY DAY   HYDROcodone-acetaminophen (NORCO/VICODIN) 5-325 MG tablet Take 1-2 tablets by mouth every 6 (six) hours as needed for severe pain.   methocarbamol (ROBAXIN) 500 MG tablet Take 1 tablet (500 mg total) by mouth every 6 (six) hours as needed for muscle spasms.   rivaroxaban (XARELTO) 10 MG TABS tablet Take 1 tablet (10 mg total) by mouth daily with breakfast.   traMADol (ULTRAM) 50 MG tablet Take 1-2 tablets (50-100 mg total) by mouth every 6 (six) hours as needed for moderate pain.   No facility-administered encounter medications on file as of 12/01/2020.    Patient Active Problem List   Diagnosis Date Noted   S/P total left hip arthroplasty 10/12/2020   Iron deficiency anemia secondary to inadequate dietary iron intake 06/18/2018   Colon polyp 03/07/2015   OA (osteoarthritis) of hip 07/26/2014   Autoimmune thyroiditis 02/21/2014   Family history of thyroid cancer 02/21/2014   Malignant neoplasm of right breast ([name]) 12/10/2011   Arthritis/joint pain 09/22/2010   Menopause 09/22/2010   HTN (hypertension) 08/21/2010   Precordial pain    Hyperlipidemia     Conditions to be addressed/monitored: monitor client management of anxiety and stress issues   Care Plan : LCSW Care Plan  Updates made by Ashley Cabal, LCSW since 12/01/2020 12:00 AM     Problem: Emotional Distress      Goal: Emotional Health Supported; Manage anxiety and stress issues faced   Start Date: 12/01/2020  Expected End Date: 03/01/2021  This Visit's Progress: On track  Recent Progress: On track  Priority: Medium  Note:   CARE PLAN  ENTRY  Current Barriers:  Mental Health challenges in managing anxiety and stress issues faced   Challenges in managing the care needs of her spouse Recovering slowly from hip surgery  Clinical Social Work Goal(s):  Over the next 30 days, patient will work with SW to manage anxiety and stress issues faced.  Patient will call LCSW or RNCM as needed in next 30  days for CCM support  Interventions:  1:1 collaboration with Dr. Janora Holland regarding development and update of comprehensive plan of care as evidenced by provider attestation and co-signature  Talked with client about her current needs Talked with Ashley Holland about her recent hip surgery Talked with client about pain issues of client Talked with client about her doing Home Exercise Program following her return home from the hospital  Talked with client about sleeping challenges Talked with client about mobility of client (she is now using a cane to help her walk) Talked with client about family support (support from daughter, from her cousin, and from her grandson) Talked with client about upcoming medical appointments Talked with client about medication procurement Talked with client about appetite of client Talked with client about pain issues of client Talked with client about vision of client Talked with client about transport needs of client Talked with client about her socialization with family and friends Talked with client about mood of client (she said she is in a positive mood) Talked with Ashley Holland about CCM program support  Patient Self Care Activities :   Patient is able to do daily ADLs as needed Patient is able to attend scheduled medical appointments Patient is able to take medications as prescribed.   Patient Deficits Anxiety issues Stress issues  Patient Goals. In next 30 days, patient will Attend scheduled medical appointments Take medications as prescribed Call RNCM or LCSW as needed for CCM support  Follow Up Plan:  LCSW to call client on 01/10/21     Ashley Holland.Ashley Holland MSW, LCSW Licensed Clinical Social Worker [facility] Care Management [phone]

## 2020-12-17 ENCOUNTER — Other Ambulatory Visit: Payer: Self-pay | Admitting: Family Medicine

## 2020-12-17 DIAGNOSIS — I10 Essential (primary) hypertension: Secondary | ICD-10-CM

## 2020-12-23 ENCOUNTER — Encounter: Payer: Self-pay | Admitting: Family Medicine

## 2020-12-23 ENCOUNTER — Ambulatory Visit: Payer: Medicare Other | Admitting: Family Medicine

## 2020-12-23 ENCOUNTER — Other Ambulatory Visit: Payer: Self-pay

## 2020-12-23 ENCOUNTER — Ambulatory Visit (INDEPENDENT_AMBULATORY_CARE_PROVIDER_SITE_OTHER): Payer: Medicare Other | Admitting: Family Medicine

## 2020-12-23 VITALS — BP 123/79 | HR 81 | Temp 97.0°F | Ht 64.0 in | Wt 147.0 lb

## 2020-12-23 DIAGNOSIS — Z636 Dependent relative needing care at home: Secondary | ICD-10-CM

## 2020-12-23 DIAGNOSIS — D649 Anemia, unspecified: Secondary | ICD-10-CM | POA: Diagnosis not present

## 2020-12-23 DIAGNOSIS — I10 Essential (primary) hypertension: Secondary | ICD-10-CM

## 2020-12-23 DIAGNOSIS — Z96642 Presence of left artificial hip joint: Secondary | ICD-10-CM | POA: Diagnosis not present

## 2020-12-23 DIAGNOSIS — E7800 Pure hypercholesterolemia, unspecified: Secondary | ICD-10-CM

## 2020-12-23 NOTE — Progress Notes (Signed)
Subjective: CC: s/p L hip replacement PCP: [name], DO UMP:NTIRWER MIASIA CRABTREE is a 80 y.o. female presenting to clinic today for:  1.  Status post left hip replacement Patient reports that she is doing well.  She is regained her strength and is up to 5 blocks walking.  No sensory changes.  No falls or instability.  2.  Hypertension Compliant with Norvasc and Microzide.  No chest pain, shortness of breath or edema  3.  Caregiver stress Patient notes that her husband continues decline mentally.  He recently had a bout of diarrhea such that he was dehydrated and had to be evaluated in the ER for IV fluids.  She still feels that she is able to care for him but she does admit that sometimes he has outbursts of anger.  She feels safe at home.  He will be getting some new hearing aids soon so she is hoping this will help with communication between them.   ROS: Per HPI  No Known Allergies Past Medical History:  Diagnosis Date   Arthritis    Per medical history form dated 11/04/09.past history DJD back   Enlarged thyroid    "was told had nodules" - no swallowing problems   Female bladder prolapse    GERD (gastroesophageal reflux disease) 05/19/2013   Malignant neoplasm of breast (female), unspecified site    right breast cancer. surgery, radiation, oral meds- Arimidex.   Nontoxic multinodular goiter 11/10/2015   Other and unspecified hyperlipidemia    Rectal prolapse    stool softeners helpful.   Unspecified essential hypertension     Current Outpatient Medications:    amLODipine (NORVASC) 5 MG tablet, TAKE 1 TABLET BY MOUTH EVERY DAY, Disp: 60 tablet, Rfl: 0   ARTIFICIAL TEAR OP, Place 1 drop into both eyes daily as needed (dry eyes)., Disp: , Rfl:    diphenhydrAMINE (BENADRYL) 25 MG tablet, Take 25 mg by mouth daily as needed for itching., Disp: , Rfl:    hydrochlorothiazide (MICROZIDE) 12.5 MG capsule, TAKE 1 CAPSULE BY MOUTH EVERY DAY, Disp: 90 capsule, Rfl: 0 Social  History   Socioeconomic History   Marital status: Married    Spouse name: Not on file   Number of children: 2   Years of education: Not on file   Highest education level: Not on file  Occupational History   Occupation: Retired    Comment: Control and instrumentation engineer  Tobacco Use   Smoking status: Never   Smokeless tobacco: Never  Vaping Use   Vaping Use: Never used  Substance and Sexual Activity   Alcohol use: No   Drug use: No   Sexual activity: Not Currently  Other Topics Concern   Not on file  Social History Narrative   Not on file   Social Determinants of Health   Financial Resource Strain: Not on file  Food Insecurity: Not on file  Transportation Needs: Not on file  Physical Activity: Insufficiently Active   Days of Exercise per Week: 2 days   Minutes of Exercise per Session: 10 min  Stress: Not on file  Social Connections: Not on file  Intimate Partner Violence: Not on file   Family History  Problem Relation Age of Onset   Coronary artery disease Mother 40        CABG age 61   Hyperlipidemia Mother    Stroke Mother    Alcohol abuse Father    Hypertension Father    Hypertension Sister    Hypertension Brother  Arthritis Brother    Anesthesia problems Neg Hx    Hypotension Neg Hx    Malignant hyperthermia Neg Hx    Pseudochol deficiency Neg Hx     Objective: Office vital signs reviewed. BP 123/79   Pulse 81   Temp (!) 97 F (36.1 C) (Temporal)   Ht _0  (1.626 m)   Wt 147 lb (66.7 kg)   SpO2 97%   BMI 25.23 kg/m   Physical Examination:  General: Awake, alert, well nourished, No acute distress HEENT: Normal, sclera white, MMM Cardio: regular rate and rhythm, S1S2 heard, no murmurs appreciated Pulm: clear to auscultation bilaterally, no wheezes, rhonchi or rales; normal work of breathing on room air Extremities: warm, well perfused, No edema, cyanosis or clubbing; +2 pulses bilaterally MSK: normal gait and station Psych: Mood stable, speech  normal.  Patient is pleasant and interactive  Depression screen [facility] 2/9 12/23/2020 12/01/2020 10/19/2020 08/31/2020 07/19/2020  Decreased Interest 0 0 0 0 0  Down, Depressed, Hopeless 0 0 0 0 0  PHQ - 2 Score 0 0 0 0 0  Altered sleeping 0 0 0 0 0  Tired, decreased energy 1 1 0 0 0  Change in appetite 0 0 0 0 0  Feeling bad or failure about yourself  0 0 0 0 0  Trouble concentrating 0 0 0 0 0  Moving slowly or fidgety/restless 0 0 0 0 0  Suicidal thoughts 0 0 0 0 0  PHQ-9 Score 1 1 0 0 0  Difficult doing work/chores Not difficult at all Somewhat difficult Not difficult at all - -  Some recent data might be hidden     Assessment/ Plan: 80 y.o. female   S/P hip replacement, left  Anemia, unspecified type - Plan: CBC  Essential hypertension  Pure hypercholesterolemia - Plan: CMP14+EGFR, Lipid Panel  Caregiver stress  Recovering well from her hip replacement.  Exam normal today  Likely postop anemia.  Check CBC  Fasting lipid panel ordered.  Has refused statin in past  Blood pressure well controlled.  No changes  We discussed appropriate boundaries for the sake of her health.  She does not request any intervention right now but will reach out to me should concerns arise  No orders of the defined types were placed in this encounter.  No orders of the defined types were placed in this encounter.    [name], DO [place] [phone]

## 2020-12-24 LAB — LIPID PANEL
Chol/HDL Ratio: 3.2 ratio (ref 0.0–4.4)
Cholesterol, Total: 219 mg/dL — ABNORMAL HIGH (ref 100–199)
HDL: 68 mg/dL (ref >39)
LDL Chol Calc (NIH): 141 mg/dL — ABNORMAL HIGH (ref 0–99)
Triglycerides: 55 mg/dL (ref 0–149)
VLDL Cholesterol Cal: 10 mg/dL (ref 5–40)

## 2020-12-24 LAB — CMP14+EGFR
ALT: 11 IU/L (ref 0–32)
AST: 26 IU/L (ref 0–40)
Albumin/Globulin Ratio: 1.4 (ref 1.2–2.2)
Albumin: 4.1 g/dL (ref 3.6–4.6)
Alkaline Phosphatase: 110 IU/L (ref 44–121)
BUN/Creatinine Ratio: 18 (ref 12–28)
BUN: 14 mg/dL (ref 8–27)
Bilirubin Total: 0.5 mg/dL (ref 0.0–1.2)
CO2: 23 mmol/L (ref 20–29)
Calcium: 9.5 mg/dL (ref 8.7–10.3)
Chloride: 100 mmol/L (ref 96–106)
Creatinine, Ser: 0.76 mg/dL (ref 0.57–1.00)
Globulin, Total: 2.9 g/dL (ref 1.5–4.5)
Glucose: 91 mg/dL (ref 65–99)
Potassium: 3.5 mmol/L (ref 3.5–5.2)
Sodium: 140 mmol/L (ref 134–144)
Total Protein: 7 g/dL (ref 6.0–8.5)
eGFR: 79 mL/min/{1.73_m2} (ref >59)

## 2020-12-24 LAB — CBC
Hematocrit: 39.8 % (ref 34.0–46.6)
Hemoglobin: 13.5 g/dL (ref 11.1–15.9)
MCH: 29.1 pg (ref 26.6–33.0)
MCHC: 33.9 g/dL (ref 31.5–35.7)
MCV: 86 fL (ref 79–97)
Platelets: 299 10*3/uL (ref 150–450)
RBC: 4.64 x10E6/uL (ref 3.77–5.28)
RDW: 13.5 % (ref 11.7–15.4)
WBC: 6.2 10*3/uL (ref 3.4–10.8)

## 2021-01-03 ENCOUNTER — Ambulatory Visit: Payer: Medicare Other | Admitting: Family Medicine

## 2021-01-10 ENCOUNTER — Telehealth: Payer: Medicare Other

## 2021-01-30 ENCOUNTER — Other Ambulatory Visit: Payer: Self-pay | Admitting: Family Medicine

## 2021-01-30 DIAGNOSIS — I10 Essential (primary) hypertension: Secondary | ICD-10-CM

## 2021-02-09 ENCOUNTER — Other Ambulatory Visit: Payer: Self-pay | Admitting: Family Medicine

## 2021-02-09 DIAGNOSIS — I10 Essential (primary) hypertension: Secondary | ICD-10-CM

## 2021-02-22 ENCOUNTER — Telehealth: Payer: Medicare Other

## 2021-03-06 ENCOUNTER — Ambulatory Visit (INDEPENDENT_AMBULATORY_CARE_PROVIDER_SITE_OTHER): Payer: Medicare Other

## 2021-03-06 VITALS — Ht 64.0 in | Wt 150.0 lb

## 2021-03-06 DIAGNOSIS — Z0000 Encounter for general adult medical examination without abnormal findings: Secondary | ICD-10-CM | POA: Diagnosis not present

## 2021-03-06 NOTE — Progress Notes (Signed)
Subjective:   Ashley Holland is a 80 y.o. female who presents for Medicare Annual (Subsequent) preventive examination.  Virtual Visit via Telephone Note  I connected with  Ashley Holland on 03/06/21 at  2:45 PM EST by telephone and verified that I am speaking with the correct person using two identifiers.  Location: Patient: Home Provider: [facility] Persons participating in the virtual visit: patient/Nurse Health Advisor   I discussed the limitations, risks, security and privacy concerns of performing an evaluation and management service by telephone and the availability of in person appointments. The patient expressed understanding and agreed to proceed.  Interactive audio and video telecommunications were attempted between this nurse and patient, however failed, due to patient having technical difficulties OR patient did not have access to video capability.  We continued and completed visit with audio only.  Some vital signs may be absent or patient reported.   Ronnetta Currington E Clevland Cork, LPN   Review of Systems     Cardiac Risk Factors include: advanced age (>35men, >49 women);hypertension;dyslipidemia     Objective:    Today's Vitals   03/06/21 1452  Weight: 150 lb (68 kg)  Height: 5\' 4"  (1.626 m)   Body mass index is 25.75 kg/m.  Advanced Directives 03/06/2021 10/12/2020 09/30/2020 03/22/2018 06/06/2017 07/26/2014 07/26/2014  Does Patient Have a Medical Advance Directive? Yes Yes Yes Yes Yes Yes -  Type of Paramedic of [place];Living will [place];Living will [place];Living will [place];Living will [place];Living will [place] -  Does patient want to make changes to medical advance directive? - No - Patient declined - - - No - Patient declined No - Patient declined  Copy of [place] in Chart? No - copy requested No - copy requested - No - copy  requested No - copy requested Yes -  Would patient like information on creating a medical advance directive? - - - No - Patient declined - - -  Pre-existing out of facility DNR order (yellow form or pink MOST form) - - - - - - -    Current Medications (verified) Outpatient Encounter Medications as of 03/06/2021  Medication Sig   amLODipine (NORVASC) 5 MG tablet TAKE 1 TABLET BY MOUTH EVERY DAY   ARTIFICIAL TEAR OP Place 1 drop into both eyes daily as needed (dry eyes).   diphenhydrAMINE (BENADRYL) 25 MG tablet Take 25 mg by mouth daily as needed for itching.   hydrochlorothiazide (MICROZIDE) 12.5 MG capsule TAKE 1 CAPSULE BY MOUTH EVERY DAY   rivaroxaban (XARELTO) 10 MG TABS tablet Xarelto 10 mg tablet  Take one tablet by mouth daily for 21 days. Then take one 81mg  Aspirin by mouth daily for 21 days, then discontinue Aspirin. (Patient not taking: Reported on 03/06/2021)   traMADol (ULTRAM) 50 MG tablet tramadol 50 mg tablet  Take one tablet by mouth every six hours as needed for moderate pain. (Patient not taking: Reported on 03/06/2021)   No facility-administered encounter medications on file as of 03/06/2021.    Allergies (verified) Patient has no known allergies.   History: Past Medical History:  Diagnosis Date   Arthritis    Per medical history form dated 11/04/09.past history DJD back   Enlarged thyroid    "was told had nodules" - no swallowing problems   Female bladder prolapse    GERD (gastroesophageal reflux disease) 05/19/2013   Malignant neoplasm of breast (female), unspecified  site    right breast cancer. surgery, radiation, oral meds- Arimidex.   Nontoxic multinodular goiter 11/10/2015   Other and unspecified hyperlipidemia    Rectal prolapse    stool softeners helpful.   Unspecified essential hypertension    Past Surgical History:  Procedure Laterality Date   ABDOMINAL HYSTERECTOMY     BREAST LUMPECTOMY  2010   Right   CHOLECYSTECTOMY     COLONOSCOPY     EUS   04/25/2011   Procedure: UPPER ENDOSCOPIC ULTRASOUND (EUS) RADIAL;  Surgeon: Landry Dyke, MD;  Location: [facility];  Service: Endoscopy;  Laterality: N/A;   TOTAL HIP ARTHROPLASTY Right 07/26/2014   Procedure: RIGHT TOTAL HIP ARTHROPLASTY ANTERIOR APPROACH;  Surgeon: Gaynelle Arabian, MD;  Location: [facility];  Service: Orthopedics;  Laterality: Right;   TOTAL HIP ARTHROPLASTY Left 10/12/2020   Procedure: TOTAL HIP ARTHROPLASTY ANTERIOR APPROACH;  Surgeon: Gaynelle Arabian, MD;  Location: [facility];  Service: Orthopedics;  Laterality: Left;   UPPER GASTROINTESTINAL ENDOSCOPY  Sept 2012   Family History  Problem Relation Age of Onset   Coronary artery disease Mother 31        CABG age 42   Hyperlipidemia Mother    Stroke Mother    Alcohol abuse Father    Hypertension Father    Hypertension Sister    Hypertension Brother    Arthritis Brother    Anesthesia problems Neg Hx    Hypotension Neg Hx    Malignant hyperthermia Neg Hx    Pseudochol deficiency Neg Hx    Social History   Socioeconomic History   Marital status: Married    Spouse name: John   Number of children: 2   Years of education: Not on file   Highest education level: Not on file  Occupational History   Occupation: Retired    Comment: Control and instrumentation engineer  Tobacco Use   Smoking status: Never   Smokeless tobacco: Never  Vaping Use   Vaping Use: Never used  Substance and Sexual Activity   Alcohol use: No   Drug use: No   Sexual activity: Not Currently  Other Topics Concern   Not on file  Social History Narrative   One level home with her husband who has dementia -she is his caregiver   Social Determinants of Radio broadcast assistant Strain: Low Risk    Difficulty of Paying Living Expenses: Not hard at all  Food Insecurity: No Food Insecurity   Worried About Charity fundraiser in the Last Year: Never true   [place] in the Last Year: Never true  Transportation Needs: No Transportation Needs   Lack of  Transportation (Medical): No   Lack of Transportation (Non-Medical): No  Physical Activity: Sufficiently Active   Days of Exercise per Week: 6 days   Minutes of Exercise per Session: 30 min  Stress: No Stress Concern Present   Feeling of Stress : Only a little  Social Connections: Engineer, building services of Communication with Friends and Family: More than three times a week   Frequency of Social Gatherings with Friends and Family: More than three times a week   Attends Religious Services: More than 4 times per year   Active Member of [employer] or Organizations: Yes   Attends Music therapist: More than 4 times per year   Marital Status: Married    Tobacco Counseling Counseling given: Not Answered   Clinical Intake:  Pre-visit preparation completed: Yes  Pain :  No/denies pain     BMI - recorded: 25.75 Nutritional Status: BMI 25 -29 Overweight Nutritional Risks: None Diabetes: No  How often do you need to have someone help you when you read instructions, pamphlets, or other written materials from your doctor or pharmacy?: 1 - Never  Diabetic? no  Interpreter Needed?: No  Information entered by :: Brittnye Josephs, LPN   Activities of Daily Living In your present state of health, do you have any difficulty performing the following activities: 03/06/2021 10/12/2020  Hearing? N -  Vision? N -  Difficulty concentrating or making decisions? N -  Walking or climbing stairs? N -  Dressing or bathing? N -  Doing errands, shopping? N N  Preparing Food and eating ? N -  Using the Toilet? N -  In the past six months, have you accidently leaked urine? Y -  Do you have problems with loss of bowel control? N -  Managing your Medications? N -  Managing your Finances? N -  Housekeeping or managing your Housekeeping? N -  Some recent data might be hidden    Patient Care Team: [name], DO as PCP - General (Family Medicine) Shea Evans [name], LCSW as  [place] (Licensed Clinical Social Worker)  Indicate any recent [place] you may have received from other than [facility] providers in the past year (date may be approximate).     Assessment:   This is a routine wellness examination for Ashley Holland.  Hearing/Vision screen Hearing Screening - Comments:: Denies hearing difficulties  Vision Screening - Comments:: Wears rx glasses - up to date with annual eye exams with [facility]  Dietary issues and exercise activities discussed: Current Exercise Habits: Home exercise routine, Type of exercise: walking, Time (Minutes): 30, Frequency (Times/Week): 6, Weekly Exercise (Minutes/Week): 180, Intensity: Mild, Exercise limited by: orthopedic condition(s)   Goals Addressed             This Visit's Progress    Have 3 meals a day   On track    Manage My Emotions; Manage anxiety and stress issues faced   On track    Timeframe:  Short-Term Goal Priority:  Medium Progress: On Track Start Date:             12/01/20     Expected End Date:      03/01/21              Follow Up Date 01/10/21   Manage Emotions:  Manage anxiety and stress issues faced    Why is this important?   When you are stressed, down or upset, your body reacts too.  For example, your blood pressure may get higher; you may have a headache or stomachache.  When your emotions get the best of you, your body's ability to fight off cold and flu gets weak.  These steps will help you manage your emotions.     Patient Self Care Activities :   Patient is able to do daily ADLs as needed Patient is able to attend scheduled medical appointments Patient is able to take medications as prescribed.   Patient Deficits Anxiety issues Stress issues  Patient Goals. In next 30 days, patient will Attend scheduled medical appointments Take medications as prescribed Call RNCM or LCSW as needed for CCM support  Follow Up Plan:  LCSW to call client on  01/10/21       Depression Screen PHQ 2/9 Scores 03/06/2021 12/23/2020 12/01/2020 10/19/2020 08/31/2020 07/19/2020  06/22/2020  PHQ - 2 Score 0 0 0 0 0 0 0  PHQ- 9 Score - 1 1 0 0 0 0    Fall Risk Fall Risk  03/06/2021 12/23/2020 08/31/2020 07/19/2020 06/22/2020  Falls in the past year? 0 0 1 1 0  Number falls in past yr: 0 - 0 0 -  Injury with Fall? 0 - 0 0 -  Risk for fall due to : Orthopedic patient - History of fall(s) - -  Follow up Falls prevention discussed - Education provided - -    FALL RISK PREVENTION PERTAINING TO THE HOME:  Any stairs in or around the home? Yes  If so, are there any without handrails? No  Home free of loose throw rugs in walkways, pet beds, electrical cords, etc? Yes  Adequate lighting in your home to reduce risk of falls? Yes   ASSISTIVE DEVICES UTILIZED TO PREVENT FALLS:  Life alert? No  Use of a cane, walker or w/c? No  Grab bars in the bathroom? No  Shower chair or bench in shower? Yes  Elevated toilet seat or a handicapped toilet? No   TIMED UP AND GO:  Was the test performed? No . Telephonic visit  Cognitive Function: Normal cognitive status assessed by direct observation by this Nurse Health Advisor. No abnormalities found.   MMSE - Mini Mental State Exam 06/06/2017  Orientation to time 5  Orientation to Place 5  Registration 3  Attention/ Calculation 5  Recall 2  Language- name 2 objects 2  Language- repeat 1  Language- follow 3 step command 3  Language- read & follow direction 1  Write a sentence 1  Copy design 1  Total score 29     6CIT Screen 03/06/2021  What Year? 0 points  What month? 0 points  What time? 0 points  Count back from 20 0 points  Months in reverse 0 points  Repeat phrase 0 points  Total Score 0    Immunizations Immunization History  Administered Date(s) Administered   Fluad Quad(high Dose 65+) 02/10/2019, 04/20/2020   Influenza Split 01/08/2013   Influenza, High Dose Seasonal PF 02/29/2016, 01/17/2018    Influenza,inj,Quad PF,6+ Mos 03/09/2014, 01/18/2015   Influenza-Unspecified 01/03/2013   Moderna Sars-Covid-2 Vaccination 06/17/2019, 07/15/2019, 03/02/2020   Pneumococcal Conjugate-13 10/20/2019   Pneumococcal-Unspecified 04/24/2007   Tdap 09/01/2018    TDAP status: Up to date  Flu Vaccine status: Due, Education has been provided regarding the importance of this vaccine. Advised may receive this vaccine at local pharmacy or Health Dept. Aware to provide a copy of the vaccination record if obtained from local pharmacy or Health Dept. Verbalized acceptance and understanding.  Pneumococcal vaccine status: Up to date  Covid-19 vaccine status: Completed vaccines  Qualifies for Shingles Vaccine? Yes   Zostavax completed No   Shingrix Completed?: No.    Education has been provided regarding the importance of this vaccine. Patient has been advised to call insurance company to determine out of pocket expense if they have not yet received this vaccine. Advised may also receive vaccine at local pharmacy or Health Dept. Verbalized acceptance and understanding.  Screening Tests Health Maintenance  Topic Date Due   COVID-19 Vaccine (4 - Booster for Moderna series) 04/27/2020   Pneumonia Vaccine 5+ Years old (2 - PPSV23 if available, else PCV20) 10/19/2020   Zoster Vaccines- Shingrix (1 of 2) 03/24/2021 (Originally 11/19/1959)   INFLUENZA VACCINE  07/21/2021 (Originally 11/21/2020)   DEXA SCAN  05/29/2021   MAMMOGRAM  11/08/2021   TETANUS/TDAP  08/31/2028   HPV VACCINES  Aged Out    Health Maintenance  Health Maintenance Due  Topic Date Due   COVID-19 Vaccine (4 - Booster for Moderna series) 04/27/2020   Pneumonia Vaccine 36+ Years old (2 - PPSV23 if available, else PCV20) 10/19/2020    Colorectal cancer screening: No longer required.   Mammogram status: Completed 11/14/2020. Repeat every year  Bone Density status: Completed 05/29/2016. Results reflect: Bone density results: NORMAL. Repeat  every 5 years.  Lung Cancer Screening: (Low Dose CT Chest recommended if Age 75-80 years, 30 pack-year currently smoking OR have quit w/in 15years.) does not qualify.   Additional Screening:  Hepatitis C Screening: does not qualify  Vision Screening: Recommended annual ophthalmology exams for early detection of glaucoma and other disorders of the eye. Is the patient up to date with their annual eye exam?  Yes  Who is the provider or what is the name of the office in which the patient attends annual eye exams? [place] If pt is not established with a provider, would they like to be referred to a provider to establish care? No .   Dental Screening: Recommended annual dental exams for proper oral hygiene  Community Resource Referral / Chronic Care Management: CRR required this visit?  No   CCM required this visit?  No      Plan:     I have personally reviewed and noted the following in the patient's chart:   Medical and social history Use of alcohol, tobacco or illicit drugs  Current medications and supplements including opioid prescriptions.  Functional ability and status Nutritional status Physical activity Advanced directives List of other physicians Hospitalizations, surgeries, and ER visits in previous 12 months Vitals Screenings to include cognitive, depression, and falls Referrals and appointments  In addition, I have reviewed and discussed with patient certain preventive protocols, quality metrics, and best practice recommendations. A written personalized care plan for preventive services as well as general preventive health recommendations were provided to patient.     Sandrea Hammond, LPN   [id number]   Nurse Notes: None

## 2021-03-06 NOTE — Patient Instructions (Signed)
Ms. Gandolfi , Thank you for taking time to come for your Medicare Wellness Visit. I appreciate your ongoing commitment to your health goals. Please review the following plan we discussed and let me know if I can assist you in the future.   Screening recommendations/referrals: Colonoscopy: Done 06/16/2015 - no repeat required Mammogram: Done 11/14/2020 - Repeat annually Bone Density: Done 05/29/2016 -Repeat in 5 years  Recommended yearly ophthalmology/optometry visit for glaucoma screening and checkup Recommended yearly dental visit for hygiene and checkup  Vaccinations: Influenza vaccine: Done 04/20/2020 - Repeat annually  Pneumococcal vaccine: Done 2009 & 10/20/2019 Tdap vaccine: Done 09/01/2018 - Repeat in 10 years Shingles vaccine: Due   Covid-19: Done 06/17/2019, 07/15/2019, & 03/02/2020  Advanced directives: Please bring a copy of your health care power of attorney and living will to the office to be added to your chart at your convenience.   Conditions/risks identified: Aim for 30 minutes of exercise or brisk walking each day, drink 6-8 glasses of water and eat lots of fruits and vegetables.   Next appointment: Follow up in one year for your annual wellness visit    Preventive Care 65 Years and Older, Female Preventive care refers to lifestyle choices and visits with your health care provider that can promote health and wellness. What does preventive care include? A yearly physical exam. This is also called an annual well check. Dental exams once or twice a year. Routine eye exams. Ask your health care provider how often you should have your eyes checked. Personal lifestyle choices, including: Daily care of your teeth and gums. Regular physical activity. Eating a healthy diet. Avoiding tobacco and drug use. Limiting alcohol use. Practicing safe sex. Taking low-dose aspirin every day. Taking vitamin and mineral supplements as recommended by your health care provider. What happens  during an annual well check? The services and screenings done by your health care provider during your annual well check will depend on your age, overall health, lifestyle risk factors, and family history of disease. Counseling  Your health care provider may ask you questions about your: Alcohol use. Tobacco use. Drug use. Emotional well-being. Home and relationship well-being. Sexual activity. Eating habits. History of falls. Memory and ability to understand (cognition). Work and work Statistician. Reproductive health. Screening  You may have the following tests or measurements: Height, weight, and BMI. Blood pressure. Lipid and cholesterol levels. These may be checked every 5 years, or more frequently if you are over 36 years old. Skin check. Lung cancer screening. You may have this screening every year starting at age 38 if you have a 30-pack-year history of smoking and currently smoke or have quit within the past 15 years. Fecal occult blood test (FOBT) of the stool. You may have this test every year starting at age 28. Flexible sigmoidoscopy or colonoscopy. You may have a sigmoidoscopy every 5 years or a colonoscopy every 10 years starting at age 41. Hepatitis C blood test. Hepatitis B blood test. Sexually transmitted disease (STD) testing. Diabetes screening. This is done by checking your blood sugar (glucose) after you have not eaten for a while (fasting). You may have this done every 1-3 years. Bone density scan. This is done to screen for osteoporosis. You may have this done starting at age 40. Mammogram. This may be done every 1-2 years. Talk to your health care provider about how often you should have regular mammograms. Talk with your health care provider about your test results, treatment options, and if necessary, the need  for more tests. Vaccines  Your health care provider may recommend certain vaccines, such as: Influenza vaccine. This is recommended every  year. Tetanus, diphtheria, and acellular pertussis (Tdap, Td) vaccine. You may need a Td booster every 10 years. Zoster vaccine. You may need this after age 79. Pneumococcal 13-valent conjugate (PCV13) vaccine. One dose is recommended after age 81. Pneumococcal polysaccharide (PPSV23) vaccine. One dose is recommended after age 70. Talk to your health care provider about which screenings and vaccines you need and how often you need them. This information is not intended to replace advice given to you by your health care provider. Make sure you discuss any questions you have with your health care provider. Document Released: 05/06/2015 Document Revised: 12/28/2015 Document Reviewed: 02/08/2015 Elsevier Interactive Patient Education  2017 [place] Prevention in the Home Falls can cause injuries. They can happen to people of all ages. There are many things you can do to make your home safe and to help prevent falls. What can I do on the outside of my home? Regularly fix the edges of walkways and driveways and fix any cracks. Remove anything that might make you trip as you walk through a door, such as a raised step or threshold. Trim any bushes or trees on the path to your home. Use bright outdoor lighting. Clear any walking paths of anything that might make someone trip, such as rocks or tools. Regularly check to see if handrails are loose or broken. Make sure that both sides of any steps have handrails. Any raised decks and porches should have guardrails on the edges. Have any leaves, snow, or ice cleared regularly. Use sand or salt on walking paths during winter. Clean up any spills in your garage right away. This includes oil or grease spills. What can I do in the bathroom? Use night lights. Install grab bars by the toilet and in the tub and shower. Do not use towel bars as grab bars. Use non-skid mats or decals in the tub or shower. If you need to sit down in the shower, use a  plastic, non-slip stool. Keep the floor dry. Clean up any water that spills on the floor as soon as it happens. Remove soap buildup in the tub or shower regularly. Attach bath mats securely with double-sided non-slip rug tape. Do not have throw rugs and other things on the floor that can make you trip. What can I do in the bedroom? Use night lights. Make sure that you have a light by your bed that is easy to reach. Do not use any sheets or blankets that are too big for your bed. They should not hang down onto the floor. Have a firm chair that has side arms. You can use this for support while you get dressed. Do not have throw rugs and other things on the floor that can make you trip. What can I do in the kitchen? Clean up any spills right away. Avoid walking on wet floors. Keep items that you use a lot in easy-to-reach places. If you need to reach something above you, use a strong step stool that has a grab bar. Keep electrical cords out of the way. Do not use floor polish or wax that makes floors slippery. If you must use wax, use non-skid floor wax. Do not have throw rugs and other things on the floor that can make you trip. What can I do with my stairs? Do not leave any items on the stairs.  Make sure that there are handrails on both sides of the stairs and use them. Fix handrails that are broken or loose. Make sure that handrails are as long as the stairways. Check any carpeting to make sure that it is firmly attached to the stairs. Fix any carpet that is loose or worn. Avoid having throw rugs at the top or bottom of the stairs. If you do have throw rugs, attach them to the floor with carpet tape. Make sure that you have a light switch at the top of the stairs and the bottom of the stairs. If you do not have them, ask someone to add them for you. What else can I do to help prevent falls? Wear shoes that: Do not have high heels. Have rubber bottoms. Are comfortable and fit you  well. Are closed at the toe. Do not wear sandals. If you use a stepladder: Make sure that it is fully opened. Do not climb a closed stepladder. Make sure that both sides of the stepladder are locked into place. Ask someone to hold it for you, if possible. Clearly mark and make sure that you can see: Any grab bars or handrails. First and last steps. Where the edge of each step is. Use tools that help you move around (mobility aids) if they are needed. These include: Canes. Walkers. Scooters. Crutches. Turn on the lights when you go into a dark area. Replace any light bulbs as soon as they burn out. Set up your furniture so you have a clear path. Avoid moving your furniture around. If any of your floors are uneven, fix them. If there are any pets around you, be aware of where they are. Review your medicines with your doctor. Some medicines can make you feel dizzy. This can increase your chance of falling. Ask your doctor what other things that you can do to help prevent falls. This information is not intended to replace advice given to you by your health care provider. Make sure you discuss any questions you have with your health care provider. Document Released: 02/03/2009 Document Revised: 09/15/2015 Document Reviewed: 05/14/2014 Elsevier Interactive Patient Education  2017 [employer].

## 2021-03-29 ENCOUNTER — Ambulatory Visit: Payer: Medicare Other | Admitting: Licensed Clinical Social Worker

## 2021-03-29 DIAGNOSIS — Z636 Dependent relative needing care at home: Secondary | ICD-10-CM

## 2021-03-29 DIAGNOSIS — D508 Other iron deficiency anemias: Secondary | ICD-10-CM

## 2021-03-29 DIAGNOSIS — M1612 Unilateral primary osteoarthritis, left hip: Secondary | ICD-10-CM

## 2021-03-29 DIAGNOSIS — E7800 Pure hypercholesterolemia, unspecified: Secondary | ICD-10-CM

## 2021-03-29 DIAGNOSIS — I10 Essential (primary) hypertension: Secondary | ICD-10-CM

## 2021-03-29 NOTE — Chronic Care Management (AMB) (Signed)
Chronic Care Management    Clinical Social Work Note  03/29/2021 Name: Ashley Holland MRN: [id number] DOB: [birth year]  Ashley Holland is a 80 y.o. year old female who is a primary care patient of Ashley Norlander, DO. The CCM team was consulted to assist the patient with chronic disease management and/or care coordination needs related to: [employer] .   Engaged with patient by telephone for follow up visit in response to provider referral for social work chronic care management and care coordination services.   Consent to Services:  The patient was given information about Chronic Care Management services, agreed to services, and gave verbal consent prior to initiation of services.  Please see initial visit note for detailed documentation.   Patient agreed to services and consent obtained.   Assessment: Review of patient past medical history, allergies, medications, and health status, including review of relevant consultants reports was performed today as part of a comprehensive evaluation and provision of chronic care management and care coordination services.     SDOH (Social Determinants of Health) assessments and interventions performed:  SDOH Interventions    Flowsheet Row Most Recent Value  SDOH Interventions   Stress Interventions Provide Counseling  [client has stress related to managing her health needs. client has stress related to caring for health needs of her spouse]  Depression Interventions/Treatment  --  [informed client of LCSW support and of RNCM support]        Advanced Directives Status: See Vynca application for related entries.  CCM Care Plan  No Known Allergies  Outpatient Encounter Medications as of 03/29/2021  Medication Sig   amLODipine (NORVASC) 5 MG tablet TAKE 1 TABLET BY MOUTH EVERY DAY   ARTIFICIAL TEAR OP Place 1 drop into both eyes daily as needed (dry eyes).   diphenhydrAMINE (BENADRYL) 25 MG tablet Take 25 mg by mouth daily as  needed for itching.   hydrochlorothiazide (MICROZIDE) 12.5 MG capsule TAKE 1 CAPSULE BY MOUTH EVERY DAY   rivaroxaban (XARELTO) 10 MG TABS tablet Xarelto 10 mg tablet  Take one tablet by mouth daily for 21 days. Then take one 81mg  Aspirin by mouth daily for 21 days, then discontinue Aspirin. (Patient not taking: Reported on 03/06/2021)   traMADol (ULTRAM) 50 MG tablet tramadol 50 mg tablet  Take one tablet by mouth every six hours as needed for moderate pain. (Patient not taking: Reported on 03/06/2021)   No facility-administered encounter medications on file as of 03/29/2021.    Patient Active Problem List   Diagnosis Date Noted   S/P total left hip arthroplasty 10/12/2020   Pain of left hip joint 07/06/2020   Iron deficiency anemia secondary to inadequate dietary iron intake 06/18/2018   Colon polyp 03/07/2015   Weight loss 03/07/2015   Nausea 03/07/2015   OA (osteoarthritis) of hip 07/26/2014   Autoimmune thyroiditis 02/21/2014   Family history of thyroid cancer 02/21/2014   Malignant neoplasm of right breast ([place]) 12/10/2011   Arthritis/joint pain 09/22/2010   Menopause 09/22/2010   HTN (hypertension) 08/21/2010   Precordial pain    Hyperlipidemia     Conditions to be addressed/monitored: monitor client management of anxiety and stress issues  Care Plan : LCSW Care Plan  Updates made by Ashley Cabal, LCSW since 03/29/2021 12:00 AM     Problem: Emotional Distress      Goal: Emotional Health Supported; Manage anxiety and stress issues faced   Start Date: 03/29/2021  Expected End Date: 06/22/2021  This Visit's  Progress: On track  Recent Progress: On track  Priority: Medium  Note:   CARE PLAN ENTRY  Current Barriers:  Mental Health challenges in managing anxiety and stress issues faced   Challenges in managing the care needs of her spouse Recovering from hip surgery  Clinical Social Work Goal(s):  Over the next 30 days, patient will work with SW to manage anxiety  and stress issues faced.  Patient will call LCSW or RNCM as needed in next 30 days for CCM support Patient will practice self care activities in next 30 days  Interventions:  1:1 collaboration with Dr. Janora Holland regarding development and update of comprehensive plan of care as evidenced by provider attestation and co-signature  Reviewed with client her recovery from hip surgery Reviewed with client her ambulation challenges. She said she is walking without use of device. She said she tries to walk about 30 minutes daily.   Discussed pain issues of client Discussed family support. Client has support from daughter, from her cousin, and from her grandson) Reviewed medication procurement for client Provided counseling support for client Reviewed health needs of her spouse. She said her spouse has Dementia and is requiring more daily support.  LCSW talked with client about Alzheimer's Support Group through [facility] and encouraged client to call Social Worker at [facility] to discuss this support group.  Reviewed vision of client.  She said she has a scheduled eye exam next week Discussed mood status of client. She said that sometimes she gets frustrated related to care needs of her spouse but realizes that his medical condition is affecting his actions. She said she talks with family members about her needs and needs of her spouse. She said that generally she feels that her mood is stable Encouraged client to call RNCM as needed for CCM nursing support Encouraged client to call LCSW as needed for social work support.  Patient Self Care Activities :   Patient is able to do daily ADLs as needed Patient is able to attend scheduled medical appointments Patient is able to take medications as prescribed.   Patient Deficits Anxiety issues Stress issues  Patient Goals. In next 30 days, patient will Attend scheduled medical appointments Take medications as  prescribed Call RNCM or LCSW as needed for CCM support  Follow Up Plan:  LCSW to call client on 05/25/21 at 11:15 AM to assess client needs      Ashley Holland.Ashley Holland MSW, LCSW Licensed Clinical Social Worker [facility] Care Management [phone]

## 2021-03-29 NOTE — Patient Instructions (Addendum)
Visit Information  Patient goals:  Manage Emotions. Manage anxiety and stress issues faced  Timeframe:  Short-Term Goal Priority:  Medium Progress: On Track Start Date:           03/29/21     Expected End Date:      06/22/21              Follow Up Date  05/25/21 at 11:15 AM   Manage Emotions:  Manage anxiety and stress issues faced    Why is this important?   When you are stressed, down or upset, your body reacts too.  For example, your blood pressure may get higher; you may have a headache or stomachache.  When your emotions get the best of you, your body's ability to fight off cold and flu gets weak.  These steps will help you manage your emotions.     Patient Self Care Activities :   Patient is able to do daily ADLs as needed Patient is able to attend scheduled medical appointments Patient is able to take medications as prescribed.   Patient Deficits Anxiety issues Stress issues  Patient Goals. In next 30 days, patient will Attend scheduled medical appointments Take medications as prescribed Call RNCM or LCSW as needed for CCM support  Follow Up Plan:  LCSW to call client on 05/25/21 at 11:15 AM to assess client needs  [name].Amiree No MSW, LCSW Licensed Clinical Social Worker [facility] Care Management [phone]

## 2021-05-10 DIAGNOSIS — H52223 Regular astigmatism, bilateral: Secondary | ICD-10-CM | POA: Diagnosis not present

## 2021-05-10 DIAGNOSIS — H5203 Hypermetropia, bilateral: Secondary | ICD-10-CM | POA: Diagnosis not present

## 2021-05-10 DIAGNOSIS — H25813 Combined forms of age-related cataract, bilateral: Secondary | ICD-10-CM | POA: Diagnosis not present

## 2021-05-10 DIAGNOSIS — H43811 Vitreous degeneration, right eye: Secondary | ICD-10-CM | POA: Diagnosis not present

## 2021-05-10 DIAGNOSIS — H524 Presbyopia: Secondary | ICD-10-CM | POA: Diagnosis not present

## 2021-05-25 ENCOUNTER — Ambulatory Visit (INDEPENDENT_AMBULATORY_CARE_PROVIDER_SITE_OTHER): Payer: Medicare Other

## 2021-05-25 DIAGNOSIS — D508 Other iron deficiency anemias: Secondary | ICD-10-CM

## 2021-05-25 DIAGNOSIS — Z636 Dependent relative needing care at home: Secondary | ICD-10-CM

## 2021-05-25 DIAGNOSIS — M1612 Unilateral primary osteoarthritis, left hip: Secondary | ICD-10-CM

## 2021-05-25 DIAGNOSIS — I10 Essential (primary) hypertension: Secondary | ICD-10-CM

## 2021-05-25 DIAGNOSIS — E7800 Pure hypercholesterolemia, unspecified: Secondary | ICD-10-CM

## 2021-05-25 DIAGNOSIS — Z853 Personal history of malignant neoplasm of breast: Secondary | ICD-10-CM

## 2021-05-25 NOTE — Chronic Care Management (AMB) (Signed)
Chronic Care Management    Clinical Social Work Note  05/25/2021 Name: Ashley Holland MRN: [id number] DOB: Apr 25, 1940  Ashley Holland is a 81 y.o. year old female who is a primary care patient of [name], DO. The CCM team was consulted to assist the patient with chronic disease management and/or care coordination needs related to: [employer] .   Engaged with patient by telephone for follow up visit in response to provider referral for social work chronic care management and care coordination services.   Consent to Services:  The patient was given information about Chronic Care Management services, agreed to services, and gave verbal consent prior to initiation of services.  Please see initial visit note for detailed documentation.   Patient agreed to services and consent obtained.   Assessment: Review of patient past medical history, allergies, medications, and health status, including review of relevant consultants reports was performed today as part of a comprehensive evaluation and provision of chronic care management and care coordination services.     SDOH (Social Determinants of Health) assessments and interventions performed:  SDOH Interventions    Flowsheet Row Most Recent Value  SDOH Interventions   Stress Interventions Provide Counseling  [client has stress related to managing her health needs. client has stress related to managing health needs of her spouse]  Depression Interventions/Treatment  --  [informed client of LCSW support and of RNCM support]        Advanced Directives Status: See Vynca application for related entries.  CCM Care Plan  No Known Allergies  Outpatient Encounter Medications as of 05/25/2021  Medication Sig   amLODipine (NORVASC) 5 MG tablet TAKE 1 TABLET BY MOUTH EVERY DAY   ARTIFICIAL TEAR OP Place 1 drop into both eyes daily as needed (dry eyes).   diphenhydrAMINE (BENADRYL) 25 MG tablet Take 25 mg by mouth daily as needed  for itching.   hydrochlorothiazide (MICROZIDE) 12.5 MG capsule TAKE 1 CAPSULE BY MOUTH EVERY DAY   rivaroxaban (XARELTO) 10 MG TABS tablet Xarelto 10 mg tablet  Take one tablet by mouth daily for 21 days. Then take one 81mg  Aspirin by mouth daily for 21 days, then discontinue Aspirin. (Patient not taking: Reported on 03/06/2021)   traMADol (ULTRAM) 50 MG tablet tramadol 50 mg tablet  Take one tablet by mouth every six hours as needed for moderate pain. (Patient not taking: Reported on 03/06/2021)   No facility-administered encounter medications on file as of 05/25/2021.    Patient Active Problem List   Diagnosis Date Noted   S/P total left hip arthroplasty 10/12/2020   Pain of left hip joint 07/06/2020   Iron deficiency anemia secondary to inadequate dietary iron intake 06/18/2018   Colon polyp 03/07/2015   Weight loss 03/07/2015   Nausea 03/07/2015   OA (osteoarthritis) of hip 07/26/2014   Autoimmune thyroiditis 02/21/2014   Family history of thyroid cancer 02/21/2014   Malignant neoplasm of right breast ([place]) 12/10/2011   Arthritis/joint pain 09/22/2010   Menopause 09/22/2010   HTN (hypertension) 08/21/2010   Precordial pain    Hyperlipidemia     Conditions to be addressed/monitored: monitor client management of anxiety issues and of depression issues  Care Plan : LCSW Care Plan  Updates made by Ashley Cabal, LCSW since 05/25/2021 12:00 AM     Problem: Emotional Distress      Goal: Emotional Health Supported; Manage anxiety and stress issues faced   Start Date: 05/25/2021  Expected End Date: 08/21/2021  This  Visit's Progress: On track  Recent Progress: On track  Priority: Medium  Note:   CARE PLAN ENTRY  Current Barriers:  Mental Health challenges in managing anxiety and stress issues faced   Challenges in managing the care needs of her spouse  Clinical Social Work Goal(s):  Over the next 30 days, patient will work with SW to manage anxiety and stress issues faced.   Patient will call LCSW or RNCM as needed in next 30 days for CCM support Patient will practice self care activities in next 30 days Patient will attend scheduled medical appointments in next 30 days  Interventions:  1:1 collaboration with Dr. [name] regarding development and update of comprehensive plan of care as evidenced by provider attestation and co-signature  Reviewed with client her ambulation challenges. She said she is walking without use of device. She said she tries to walk about 30 minutes daily.   Discussed pain issues of client Discussed family support. Client has support from daughter, from her cousin, and from her grandson) Reviewed medication procurement for client Provided counseling support for client Reviewed health needs of her spouse. She said her spouse has Dementia and is requiring more daily support.  LCSW talked previously with client about Alzheimer's Support Group through [facility] and encouraged client to call Social Worker at [facility] to discuss this support group. LCSW talked with Ashley Holland about ADLs care for her spouse. Reviewed vision  needs of client. She has new glasses to help her with vision. Discussed mood status of client. She said that sometimes she gets frustrated related to care needs of her spouse but realizes that his medical condition is affecting his actions. She said she has been adjusting to the care needs of her spouse and is not getting quite as frustrated, realizing that medical condition is affecting the behavior of her spouse. She feels that her mood is stable  Discussed socialization of client. She volunteers for church activities, likes to read and plays scrabble weekly with her friends. Encouraged client to call RNCM as needed for CCM nursing support Encouraged client to call LCSW as needed for social work support.  Patient Self Care Activities :   Patient is able to do daily ADLs as needed Patient is able  to attend scheduled medical appointments Patient is able to take medications as prescribed.   Patient Deficits Anxiety issues Stress issues  Patient Goals. In next 30 days, patient will Attend scheduled medical appointments Take medications as prescribed Call RNCM or LCSW as needed for CCM support  Follow Up Plan:  LCSW to call client on 07/19/21 at 2:00 PM to assess client needs     Ashley Holland MSW, [place] [employer] representative [facility] Care Management [phone]

## 2021-05-25 NOTE — Patient Instructions (Addendum)
Visit Information  Patient Goals:  Manage Emotions. Manage anxiety and stress issues faced  Timeframe:  Short-Term Goal Priority:  Medium Progress: On Track Start Date:           05/25/21     Expected End Date:      08/21/21              Follow Up Date  07/19/21 at 2:00 PM   Manage Emotions:  Manage anxiety and stress issues faced    Why is this important?   When you are stressed, down or upset, your body reacts too.  For example, your blood pressure may get higher; you may have a headache or stomachache.  When your emotions get the best of you, your body's ability to fight off cold and flu gets weak.  These steps will help you manage your emotions.     Patient Self Care Activities :   Patient is able to do daily ADLs as needed Patient is able to attend scheduled medical appointments Patient is able to take medications as prescribed.   Patient Deficits Anxiety issues Stress issues  Patient Goals. In next 30 days, patient will Attend scheduled medical appointments Take medications as prescribed Call RNCM or LCSW as needed for CCM support  Follow Up Plan:  LCSW to call client on 07/19/21 at 2:00 PM to assess client needs  [name].Tim Wilhide MSW, [place] Holiday representative [facility] Care Management [phone]

## 2021-06-20 DIAGNOSIS — M1612 Unilateral primary osteoarthritis, left hip: Secondary | ICD-10-CM

## 2021-06-20 DIAGNOSIS — D508 Other iron deficiency anemias: Secondary | ICD-10-CM

## 2021-06-20 DIAGNOSIS — I10 Essential (primary) hypertension: Secondary | ICD-10-CM

## 2021-06-20 DIAGNOSIS — E7800 Pure hypercholesterolemia, unspecified: Secondary | ICD-10-CM

## 2021-06-20 DIAGNOSIS — Z853 Personal history of malignant neoplasm of breast: Secondary | ICD-10-CM

## 2021-06-22 ENCOUNTER — Encounter: Payer: Self-pay | Admitting: Family Medicine

## 2021-06-22 ENCOUNTER — Ambulatory Visit (INDEPENDENT_AMBULATORY_CARE_PROVIDER_SITE_OTHER): Payer: Medicare Other | Admitting: Family Medicine

## 2021-06-22 VITALS — BP 140/90 | HR 92 | Temp 97.7°F | Ht 64.0 in | Wt 150.2 lb

## 2021-06-22 DIAGNOSIS — K5909 Other constipation: Secondary | ICD-10-CM

## 2021-06-22 DIAGNOSIS — Z636 Dependent relative needing care at home: Secondary | ICD-10-CM | POA: Diagnosis not present

## 2021-06-22 DIAGNOSIS — I10 Essential (primary) hypertension: Secondary | ICD-10-CM | POA: Diagnosis not present

## 2021-06-22 DIAGNOSIS — K649 Unspecified hemorrhoids: Secondary | ICD-10-CM | POA: Diagnosis not present

## 2021-06-22 NOTE — Patient Instructions (Signed)
Thank you for coming in to clinic today. ? ?1. Start 17g or 1 capful daily, may adjust dose up or down by half a capful every few days. Recommend to take this medicine daily for next 1-2 weeks, then may need to use it longer if needed. ?- Goal is to have soft regular bowel movement 1-3x daily, if too runny or diarrhea, then reduce dose of the medicine ? ?Improve water intake, hydration will help ?Also recommend increased vegetables, fruits, fiber intake ?Can try daily Metamucil or Fiber supplement at pharmacy over the counter ? ?Follow-up if symptoms are not improving with bowel movements, or if pain worsens, develop fevers, nausea, vomiting. ? ?If you have any other questions or concerns, please feel free to call the clinic to contact me. You may also schedule an earlier appointment if necessary. ? ?However, if your symptoms get significantly worse, please go to the Emergency Department to seek immediate medical attention. ? ?

## 2021-06-22 NOTE — Progress Notes (Signed)
? ?Subjective: ?CC: Chronic follow-up ?PCP: [name], DO ?CZY:SAYTKZS Ashley Holland is a 81 y.o. female presenting to clinic today for: ? ?1.  Hypertension ?Patient is compliant with Norvasc and Microzide.  She takes these at bedtime.  No chest pain, shortness of breath or dizziness reported ? ?2. Constipation ?Patient reports constipation.  She has a bleeding hemorrhoid currently and will be seeing gastroenterology on Wednesday for treatment of this.  They will be doing a banding procedure.  She denies any rectal pain however.  Not currently utilizing anything for constipation but she admits that she strains ?ROS: Per HPI ? ?No Known Allergies ?Past Medical History:  ?Diagnosis Date  ? Arthritis   ? Per medical history form dated 11/04/09.past history DJD back  ? Enlarged thyroid   ? "was told had nodules" - no swallowing problems  ? Female bladder prolapse   ? GERD (gastroesophageal reflux disease) 05/19/2013  ? Malignant neoplasm of breast (female), unspecified site   ? right breast cancer. surgery, radiation, oral meds- Arimidex.  ? Nontoxic multinodular goiter 11/10/2015  ? Other and unspecified hyperlipidemia   ? Rectal prolapse   ? stool softeners helpful.  ? Unspecified essential hypertension   ? ? ?Current Outpatient Medications:  ?  amLODipine (NORVASC) 5 MG tablet, TAKE 1 TABLET BY MOUTH EVERY DAY, Disp: 180 tablet, Rfl: 1 ?  ARTIFICIAL TEAR OP, Place 1 drop into both eyes daily as needed (dry eyes)., Disp: , Rfl:  ?  diphenhydrAMINE (BENADRYL) 25 MG tablet, Take 25 mg by mouth daily as needed for itching., Disp: , Rfl:  ?  hydrochlorothiazide (MICROZIDE) 12.5 MG capsule, TAKE 1 CAPSULE BY MOUTH EVERY DAY, Disp: 90 capsule, Rfl: 1 ?Social History  ? ?Socioeconomic History  ? Marital status: Married  ?  Spouse name: Jenny Reichmann  ? Number of children: 2  ? Years of education: Not on file  ? Highest education level: Not on file  ?Occupational History  ? Occupation: Retired  ?  Comment: Medical receptionist   ?Tobacco Use  ? Smoking status: Never  ? Smokeless tobacco: Never  ?Vaping Use  ? Vaping Use: Never used  ?Substance and Sexual Activity  ? Alcohol use: No  ? Drug use: No  ? Sexual activity: Not Currently  ?Other Topics Concern  ? Not on file  ?Social History Narrative  ? One level home with her husband who has dementia -she is his caregiver  ? ?Social Determinants of Health  ? ?Financial Resource Strain: Low Risk   ? Difficulty of Paying Living Expenses: Not hard at all  ?Food Insecurity: No Food Insecurity  ? Worried About Charity fundraiser in the Last Year: Never true  ? Ran Out of Food in the Last Year: Never true  ?Transportation Needs: No Transportation Needs  ? Lack of Transportation (Medical): No  ? Lack of Transportation (Non-Medical): No  ?Physical Activity: Sufficiently Active  ? Days of Exercise per Week: 6 days  ? Minutes of Exercise per Session: 30 min  ?Stress: Stress Concern Present  ? Feeling of Stress : Rather much  ?Social Connections: Socially Integrated  ? Frequency of Communication with Friends and Family: More than three times a week  ? Frequency of Social Gatherings with Friends and Family: More than three times a week  ? Attends Religious Services: More than 4 times per year  ? Active Member of Clubs or Organizations: Yes  ? Attends Archivist Meetings: More than 4 times per year  ?  Marital Status: Married  ?Intimate Partner Violence: Not At Risk  ? Fear of Current or Ex-Partner: No  ? Emotionally Abused: No  ? Physically Abused: No  ? Sexually Abused: No  ? ?Family History  ?Problem Relation Age of Onset  ? Coronary artery disease Mother 23  ?      CABG age 61  ? Hyperlipidemia Mother   ? Stroke Mother   ? Alcohol abuse Father   ? Hypertension Father   ? Hypertension Sister   ? Hypertension Brother   ? Arthritis Brother   ? Anesthesia problems Neg Hx   ? Hypotension Neg Hx   ? Malignant hyperthermia Neg Hx   ? Pseudochol deficiency Neg Hx   ? ? ?Objective: ?Office vital signs  reviewed. ?BP 140/90   Pulse 92   Temp 97.7 ?F (36.5 ?C)   Ht 5\' 4"  (1.626 m)   Wt 150 lb 3.2 oz (68.1 kg)   SpO2 98%   BMI 25.78 kg/m?  ? ?Physical Examination:  ?General: Awake, alert, well nourished, No acute distress ?HEENT: Sclera white.  Moist mucous membranes ?Cardio: regular rate and rhythm, S1S2 heard, no murmurs appreciated ?Pulm: clear to auscultation bilaterally, no wheezes, rhonchi or rales; normal work of breathing on room air ? ? ?Assessment/ Plan: ?81 y.o. female  ? ?Primary hypertension ? ?Caregiver stress ? ?Other constipation ? ?Bleeding hemorrhoid ? ?Blood pressure is controlled upon recheck.  No changes.  We will continue to monitor at home ? ?She seems to be managing her caregiver stress fairly well lately ? ?MiraLAX instructions were provided today.  Could consider Linzess or similar if symptoms are refractory to OTC management ? ?Awaiting eval and treatment by specialty for bleeding hemorrhoid.  Keep bowel movements soft ? ?No orders of the defined types were placed in this encounter. ? ?No orders of the defined types were placed in this encounter. ? ? ? ?[name], DO ?[place] ?[phone] ? ?

## 2021-06-28 DIAGNOSIS — K648 Other hemorrhoids: Secondary | ICD-10-CM | POA: Diagnosis not present

## 2021-06-28 DIAGNOSIS — N814 Uterovaginal prolapse, unspecified: Secondary | ICD-10-CM | POA: Diagnosis not present

## 2021-07-12 DIAGNOSIS — K648 Other hemorrhoids: Secondary | ICD-10-CM | POA: Diagnosis not present

## 2021-07-19 ENCOUNTER — Ambulatory Visit (INDEPENDENT_AMBULATORY_CARE_PROVIDER_SITE_OTHER): Payer: Medicare Other | Admitting: Licensed Clinical Social Worker

## 2021-07-19 DIAGNOSIS — M1612 Unilateral primary osteoarthritis, left hip: Secondary | ICD-10-CM

## 2021-07-19 DIAGNOSIS — I10 Essential (primary) hypertension: Secondary | ICD-10-CM

## 2021-07-19 DIAGNOSIS — E7800 Pure hypercholesterolemia, unspecified: Secondary | ICD-10-CM

## 2021-07-19 DIAGNOSIS — Z853 Personal history of malignant neoplasm of breast: Secondary | ICD-10-CM

## 2021-07-19 DIAGNOSIS — D508 Other iron deficiency anemias: Secondary | ICD-10-CM

## 2021-07-19 DIAGNOSIS — Z636 Dependent relative needing care at home: Secondary | ICD-10-CM

## 2021-07-19 NOTE — Patient Instructions (Addendum)
Visit Information ? ?Patient Goals:  Manage Emotions.  Manage anxiety and stress issues faced. ? ?Timeframe:  Short-Term Goal ?Priority:  Medium ?Progress: On Track ?Start Date:           07/19/21     ?Expected End Date:      10/16/21               ? ?Follow Up Date  09/11/21 at 1:00 PM  ?  ?Manage Emotions:  Manage anxiety and stress issues faced  ?  ?Why is this important?   ?When you are stressed, down or upset, your body reacts too.  ?For example, your blood pressure may get higher; you may have a headache or stomachache.  ?When your emotions get the best of you, your body's ability to fight off cold and flu gets weak.  ?These steps will help you manage your emotions.    ? ?Patient Self Care Activities :   ?Patient is able to do daily ADLs as needed ?Patient is able to attend scheduled medical appointments ?Patient is able to take medications as prescribed.  ? ?Patient Deficits ?Anxiety issues ?Stress issues ? ?Patient Goals. ?In next 30 days, patient will ?Attend scheduled medical appointments ?Take medications as prescribed ?Call RNCM or LCSW as needed for CCM support ? ?Follow Up Plan:  LCSW to call client on 09/11/21 at 1:00 PM  ? ?[name].Rendon Howell MSW, LCSW ?Licensed Clinical Social Worker ?[place] [facility] ?[phone] ?

## 2021-07-19 NOTE — Chronic Care Management (AMB) (Signed)
?Chronic Care Management  ? ? Clinical Social Work Note ? ?07/19/2021 ?Name: Ashley Holland MRN: [id number] DOB: 1940-11-26 ? ?Ashley Holland is a 81 y.o. year old female who is a primary care patient of [name], DO. The CCM team was consulted to assist the patient with chronic disease management and/or care coordination needs related to: [employer] .  ? ?Engaged with patient by telephone for follow up visit in response to provider referral for social work chronic care management and care coordination services.  ? ?Consent to Services:  ?The patient was given information about Chronic Care Management services, agreed to services, and gave verbal consent prior to initiation of services.  Please see initial visit note for detailed documentation.  ? ?Patient agreed to services and consent obtained.  ? ?Assessment: Review of patient past medical history, allergies, medications, and health status, including review of relevant consultants reports was performed today as part of a comprehensive evaluation and provision of chronic care management and care coordination services.    ? ?SDOH (Social Determinants of Health) assessments and interventions performed:  ?SDOH Interventions   ? ?Flowsheet Row Most Recent Value  ?SDOH Interventions   ?Stress Interventions Provide Counseling  [client has stress related to managing medical needs. client has stress related to meeting the care needs of her spouse]  ?Depression Interventions/Treatment  Counseling  ? ?  ?  ? ?Advanced Directives Status: See Vynca application for related entries. ? ?CCM Care Plan ? ?No Known Allergies ? ?Outpatient Encounter Medications as of 07/19/2021  ?Medication Sig  ? amLODipine (NORVASC) 5 MG tablet TAKE 1 TABLET BY MOUTH EVERY DAY  ? ARTIFICIAL TEAR OP Place 1 drop into both eyes daily as needed (dry eyes).  ? diphenhydrAMINE (BENADRYL) 25 MG tablet Take 25 mg by mouth daily as needed for itching.  ? hydrochlorothiazide (MICROZIDE)  12.5 MG capsule TAKE 1 CAPSULE BY MOUTH EVERY DAY  ? ?No facility-administered encounter medications on file as of 07/19/2021.  ? ? ?Patient Active Problem List  ? Diagnosis Date Noted  ? S/P total left hip arthroplasty 10/12/2020  ? Pain of left hip joint 07/06/2020  ? Iron deficiency anemia secondary to inadequate dietary iron intake 06/18/2018  ? Colon polyp 03/07/2015  ? Weight loss 03/07/2015  ? Nausea 03/07/2015  ? OA (osteoarthritis) of hip 07/26/2014  ? Autoimmune thyroiditis 02/21/2014  ? Family history of thyroid cancer 02/21/2014  ? Malignant neoplasm of right breast ([place]) 12/10/2011  ? Arthritis/joint pain 09/22/2010  ? Menopause 09/22/2010  ? HTN (hypertension) 08/21/2010  ? Precordial pain   ? Hyperlipidemia   ? ? ?Conditions to be addressed/monitored: monitor client management of anxiety issues and depression issues ? ?Care Plan : LCSW Care Plan  ?Updates made by [name], LCSW since 07/19/2021 12:00 AM  ?  ? ?Problem: Emotional Distress   ?  ? ?Goal: Emotional Health Supported; Manage anxiety and stress issues faced   ?Start Date: 07/19/2021  ?Expected End Date: 10/16/2021  ?This Visit's Progress: On track  ?Recent Progress: On track  ?Priority: Medium  ?Note:   ?CARE PLAN ENTRY ? ?Current Barriers:  ?Mental Health challenges in managing anxiety and stress issues faced   ?Challenges in managing the care needs of her spouse ? ?Clinical Social Work Goal(s):  ?Over the next 30 days, patient will work with SW to manage anxiety and stress issues faced.  ?Patient will call LCSW or RNCM as needed in next 30 days for CCM support ?Patient  will practice self care activities in next 30 days ?Patient will attend scheduled medical appointments in next 30 days ? ?Interventions:  ?1:1 collaboration with Dr. [name] regarding development and update of comprehensive plan of care as evidenced by provider attestation and co-signature  ?Reviewed with client her ambulation challenges. She said she is  walking without use of device. She said she tries to walk about 30 minutes daily.   ?Discussed pain issues of client ?Discussed family support. Client has support from daughter, from her cousin, and from her grandson) ?Reviewed medication procurement for client ?Provided counseling support for client ?Reviewed health needs of her spouse. She said her spouse has Dementia and is requiring more daily support.  LCSW talked previously with client about Alzheimer's Support Group through [facility] and encouraged client to call Social Worker at [facility] to discuss this support group. LCSW talked with Brenda about ADLs care for her spouse. ?Reviewed vision  needs of client. She has new glasses to help her with vision. ?Discussed mood status of client. She said that  she thought her mood was stable.  She is trying to be patient related to meeting care needs of her spouse. ?Encouraged client to call RNCM as needed for CCM nursing support ?Encouraged client to call LCSW as needed for social work support. ? ?Patient Self Care Activities :   ?Patient is able to do daily ADLs as needed ?Patient is able to attend scheduled medical appointments ?Patient is able to take medications as prescribed.  ? ?Patient Deficits ?Anxiety issues ?Stress issues ? ?Patient Goals. ?In next 30 days, patient will ?Attend scheduled medical appointments ?Take medications as prescribed ?Call RNCM or LCSW as needed for CCM support ? ?Follow Up Plan:  LCSW to call client on 09/11/21 at 1:00 PM   ?  ?[name].Aundrea Higginbotham MSW, LCSW ?Licensed Clinical Social Worker ?[place] [facility] ?[phone] ?

## 2021-07-21 DIAGNOSIS — Z853 Personal history of malignant neoplasm of breast: Secondary | ICD-10-CM

## 2021-07-21 DIAGNOSIS — D508 Other iron deficiency anemias: Secondary | ICD-10-CM

## 2021-07-21 DIAGNOSIS — E7800 Pure hypercholesterolemia, unspecified: Secondary | ICD-10-CM

## 2021-07-21 DIAGNOSIS — M1612 Unilateral primary osteoarthritis, left hip: Secondary | ICD-10-CM

## 2021-07-21 DIAGNOSIS — I10 Essential (primary) hypertension: Secondary | ICD-10-CM

## 2021-08-02 ENCOUNTER — Other Ambulatory Visit: Payer: Self-pay | Admitting: Family Medicine

## 2021-08-02 DIAGNOSIS — I10 Essential (primary) hypertension: Secondary | ICD-10-CM

## 2021-08-16 DIAGNOSIS — K648 Other hemorrhoids: Secondary | ICD-10-CM | POA: Diagnosis not present

## 2021-08-16 DIAGNOSIS — N814 Uterovaginal prolapse, unspecified: Secondary | ICD-10-CM | POA: Diagnosis not present

## 2021-09-11 ENCOUNTER — Ambulatory Visit (INDEPENDENT_AMBULATORY_CARE_PROVIDER_SITE_OTHER): Payer: Medicare Other | Admitting: Licensed Clinical Social Worker

## 2021-09-11 DIAGNOSIS — Z853 Personal history of malignant neoplasm of breast: Secondary | ICD-10-CM

## 2021-09-11 DIAGNOSIS — Z636 Dependent relative needing care at home: Secondary | ICD-10-CM

## 2021-09-11 DIAGNOSIS — D649 Anemia, unspecified: Secondary | ICD-10-CM

## 2021-09-11 DIAGNOSIS — I10 Essential (primary) hypertension: Secondary | ICD-10-CM

## 2021-09-11 DIAGNOSIS — M1612 Unilateral primary osteoarthritis, left hip: Secondary | ICD-10-CM

## 2021-09-11 DIAGNOSIS — E7800 Pure hypercholesterolemia, unspecified: Secondary | ICD-10-CM

## 2021-09-11 NOTE — Chronic Care Management (AMB) (Signed)
Chronic Care Management    Clinical Social Work Note  09/11/2021 Name: Ashley Holland MRN: [id number] DOB: 1940/08/02  Ashley Holland is a 81 y.o. year old female who is a primary care patient of [name], DO. The CCM team was consulted to assist the patient with chronic disease management and/or care coordination needs related to: [employer] .   Engaged with patient by telephone for follow up visit in response to provider referral for social work chronic care management and care coordination services.   Consent to Services:  The patient was given information about Chronic Care Management services, agreed to services, and gave verbal consent prior to initiation of services.  Please see initial visit note for detailed documentation.   Patient agreed to services and consent obtained.   Assessment: Review of patient past medical history, allergies, medications, and health status, including review of relevant consultants reports was performed today as part of a comprehensive evaluation and provision of chronic care management and care coordination services.     SDOH (Social Determinants of Health) assessments and interventions performed:  SDOH Interventions    Flowsheet Row Most Recent Value  SDOH Interventions   Physical Activity Interventions Other (Comments)  [client does walk daily. She does get fatigued occasionally. She does have low energy occasionally]  Stress Interventions Provide Counseling  [client has some caregiver stress issues]        Advanced Directives Status: See Vynca application for related entries.  CCM Care Plan  No Known Allergies  Outpatient Encounter Medications as of 09/11/2021  Medication Sig   amLODipine (NORVASC) 5 MG tablet TAKE 1 TABLET BY MOUTH EVERY DAY   ARTIFICIAL TEAR OP Place 1 drop into both eyes daily as needed (dry eyes).   diphenhydrAMINE (BENADRYL) 25 MG tablet Take 25 mg by mouth daily as needed for itching.    hydrochlorothiazide (MICROZIDE) 12.5 MG capsule TAKE 1 CAPSULE BY MOUTH EVERY DAY   No facility-administered encounter medications on file as of 09/11/2021.    Patient Active Problem List   Diagnosis Date Noted   S/P total left hip arthroplasty 10/12/2020   Pain of left hip joint 07/06/2020   Iron deficiency anemia secondary to inadequate dietary iron intake 06/18/2018   Colon polyp 03/07/2015   Weight loss 03/07/2015   Nausea 03/07/2015   OA (osteoarthritis) of hip 07/26/2014   Autoimmune thyroiditis 02/21/2014   Family history of thyroid cancer 02/21/2014   Malignant neoplasm of right breast ([place]) 12/10/2011   Arthritis/joint pain 09/22/2010   Menopause 09/22/2010   HTN (hypertension) 08/21/2010   Precordial pain    Hyperlipidemia     Conditions to be addressed/monitored: monitor client management of anxiety issues and stress issues  Care Plan : LCSW Care Plan  Updates made by [name], LCSW since 09/11/2021 12:00 AM     Problem: Emotional Distress      Goal: Emotional Health Supported; Manage anxiety and stress issues faced   Start Date: 07/19/2021  Expected End Date: 11/14/2021  This Visit's Progress: On track  Recent Progress: On track  Priority: Medium  Note:   CARE PLAN ENTRY  Current Barriers:  Mental Health challenges in managing anxiety and stress issues faced   Challenges in managing the care needs of her spouse  Clinical Social Work Goal(s):  Over the next 30 days, patient will work with SW to manage anxiety and stress issues faced.  Patient will call LCSW or RNCM as needed in next 30 days for  CCM support Patient will practice self care activities in next 30 days Patient will attend scheduled medical appointments in next 30 days  Interventions:  1:1 collaboration with Dr. [name] regarding development and update of comprehensive plan of care as evidenced by provider attestation and co-signature  Reviewed with client her ambulation  challenges. She said she is walking without use of device. She said she tries to walk about 30 minutes daily.  She said she often walks with a neighbor and enjoys socializing with neighbor as she walks Discussed pain issues of client Discussed family support. Client has support from daughter, from her cousin, and from her grandson). She said that her daughter is very helpful . Her daughter will sometimes stay with spouse of client for a while to give [place] a chance to complete errands, shop, or manage personal activities.  Reviewed medication procurement for client Provided counseling support for client Reviewed health needs of her spouse. She said her spouse has Dementia and is requiring more daily support.  She said she is adjusting more to the daily care needs of her spouse. She has strong family support. She has relatives who visit weekly to play scrabble. She tries to socialize with family/ friends as able. She said she feels that she is managing the care needs of her spouse better at present.   Reviewed vision  needs of client. She has new glasses to help her with vision. Discussed mood status of client. She said that  she thought her mood was stable.  She is trying to be patient related to meeting care needs of her spouse. She is adjusting to daily needs of her spouse who has Dementia Encouraged client to call RNCM as needed for CCM nursing support Encouraged client to call LCSW as needed for social work support.  Patient Self Care Activities :   Patient is able to do daily ADLs as needed Patient is able to attend scheduled medical appointments Patient is able to take medications as prescribed.   Patient Deficits Anxiety issues Stress issues  Patient Goals. In next 30 days, patient will Attend scheduled medical appointments Take medications as prescribed Call RNCM or LCSW as needed for CCM support  Follow Up Plan:  LCSW to call client on 10/18/21 at 1:00 PM      [name].Brette Cast  MSW, LCSW Licensed Clinical Social Worker [facility] Care Management [phone]

## 2021-09-11 NOTE — Patient Instructions (Addendum)
Visit Information  Patient Goals:  Manage Emotions: Manage anxiety and stress issues faced  Timeframe:  Short-Term Goal Priority:  Medium Progress: On Track Start Date:           07/19/21     Expected End Date:      11/14/21                 Follow Up Date  10/18/21 at 1:00 PM     Manage Emotions:  Manage anxiety and stress issues faced    Why is this important?   When you are stressed, down or upset, your body reacts too.  For example, your blood pressure may get higher; you may have a headache or stomachache.  When your emotions get the best of you, your body's ability to fight off cold and flu gets weak.  These steps will help you manage your emotions.     Patient Self Care Activities :   Patient is able to do daily ADLs as needed Patient is able to attend scheduled medical appointments Patient is able to take medications as prescribed.   Patient Deficits Anxiety issues Stress issues  Patient Goals. In next 30 days, patient will Attend scheduled medical appointments Take medications as prescribed Call RNCM or LCSW as needed for CCM support  Follow Up Plan:  LCSW to call client on 10/18/21 at 1:00 PM   [name].Whalen Trompeter MSW, [place] Holiday representative [facility] Care Management [phone]

## 2021-09-20 DIAGNOSIS — D649 Anemia, unspecified: Secondary | ICD-10-CM

## 2021-09-20 DIAGNOSIS — F419 Anxiety disorder, unspecified: Secondary | ICD-10-CM

## 2021-09-20 DIAGNOSIS — E7800 Pure hypercholesterolemia, unspecified: Secondary | ICD-10-CM

## 2021-09-20 DIAGNOSIS — Z853 Personal history of malignant neoplasm of breast: Secondary | ICD-10-CM

## 2021-09-20 DIAGNOSIS — M1612 Unilateral primary osteoarthritis, left hip: Secondary | ICD-10-CM

## 2021-09-20 DIAGNOSIS — I10 Essential (primary) hypertension: Secondary | ICD-10-CM

## 2021-09-22 DIAGNOSIS — N362 Urethral caruncle: Secondary | ICD-10-CM | POA: Diagnosis not present

## 2021-09-22 DIAGNOSIS — N3942 Incontinence without sensory awareness: Secondary | ICD-10-CM | POA: Diagnosis not present

## 2021-09-22 DIAGNOSIS — R82998 Other abnormal findings in urine: Secondary | ICD-10-CM | POA: Diagnosis not present

## 2021-09-22 DIAGNOSIS — N952 Postmenopausal atrophic vaginitis: Secondary | ICD-10-CM | POA: Diagnosis not present

## 2021-09-22 DIAGNOSIS — N993 Prolapse of vaginal vault after hysterectomy: Secondary | ICD-10-CM | POA: Diagnosis not present

## 2021-10-18 ENCOUNTER — Ambulatory Visit (INDEPENDENT_AMBULATORY_CARE_PROVIDER_SITE_OTHER): Payer: Medicare Other | Admitting: Licensed Clinical Social Worker

## 2021-10-18 DIAGNOSIS — E7800 Pure hypercholesterolemia, unspecified: Secondary | ICD-10-CM

## 2021-10-18 DIAGNOSIS — I10 Essential (primary) hypertension: Secondary | ICD-10-CM

## 2021-10-18 DIAGNOSIS — D649 Anemia, unspecified: Secondary | ICD-10-CM

## 2021-10-18 DIAGNOSIS — Z636 Dependent relative needing care at home: Secondary | ICD-10-CM

## 2021-10-18 NOTE — Chronic Care Management (AMB) (Signed)
Chronic Care Management    Clinical Social Work Note  10/18/2021 Name: Ashley Holland MRN: [id number] DOB: [birth year]  Ashley Holland is a 81 y.o. year old female who is a primary care patient of [name], DO. The CCM team was consulted to assist the patient with chronic disease management and/or care coordination needs related to: [employer] .   Engaged with patient by telephone for follow up visit in response to provider referral for social work chronic care management and care coordination services.   Consent to Services:  The patient was given information about Chronic Care Management services, agreed to services, and gave verbal consent prior to initiation of services.  Please see initial visit note for detailed documentation.   Patient agreed to services and consent obtained.   Assessment: Review of patient past medical history, allergies, medications, and health status, including review of relevant consultants reports was performed today as part of a comprehensive evaluation and provision of chronic care management and care coordination services.     SDOH (Social Determinants of Health) assessments and interventions performed:  SDOH Interventions    Flowsheet Row Most Recent Value  SDOH Interventions   Stress Interventions Provide Counseling  [client has stress related to caregiver issues. She is primary caregiver to her spouse who has dementia]  Depression Interventions/Treatment  Counseling        Advanced Directives Status: See Vynca application for related entries.  CCM Care Plan  No Known Allergies  Outpatient Encounter Medications as of 10/18/2021  Medication Sig   amLODipine (NORVASC) 5 MG tablet TAKE 1 TABLET BY MOUTH EVERY DAY   ARTIFICIAL TEAR OP Place 1 drop into both eyes daily as needed (dry eyes).   diphenhydrAMINE (BENADRYL) 25 MG tablet Take 25 mg by mouth daily as needed for itching.   hydrochlorothiazide (MICROZIDE) 12.5 MG capsule  TAKE 1 CAPSULE BY MOUTH EVERY DAY   No facility-administered encounter medications on file as of 10/18/2021.    Patient Active Problem List   Diagnosis Date Noted   S/P total left hip arthroplasty 10/12/2020   Pain of left hip joint 07/06/2020   Iron deficiency anemia secondary to inadequate dietary iron intake 06/18/2018   Colon polyp 03/07/2015   Weight loss 03/07/2015   Nausea 03/07/2015   OA (osteoarthritis) of hip 07/26/2014   Autoimmune thyroiditis 02/21/2014   Family history of thyroid cancer 02/21/2014   Malignant neoplasm of right breast ([place]) 12/10/2011   Arthritis/joint pain 09/22/2010   Menopause 09/22/2010   HTN (hypertension) 08/21/2010   Precordial pain    Hyperlipidemia     Conditions to be addressed/monitored: monitor client management of anxiety issues and stress issues  Care Plan : LCSW Care Plan  Updates made by [name], LCSW since 10/18/2021 12:00 AM     Problem: Emotional Distress      Goal: Emotional Health Supported; Manage anxiety and stress issues faced   Start Date: 07/19/2021  Expected End Date: 01/15/2022  This Visit's Progress: On track  Recent Progress: On track  Priority: Medium  Note:   CARE PLAN ENTRY  Current Barriers:  Mental Health challenges in managing anxiety and stress issues faced   Challenges in managing the care needs of her spouse  Clinical Social Work Goal(s):  Over the next 30 days, patient will work with SW to manage anxiety and stress issues faced.  Patient will call LCSW or RNCM as needed in next 30 days for CCM support Patient will practice self  care activities in next 30 days Patient will attend scheduled medical appointments in next 30 days  Interventions:  1:1 collaboration with Dr. [name] regarding development and update of comprehensive plan of care as evidenced by provider attestation and co-signature  Reviewed with client her ambulation challenges. She said she is walking without use of  device. She said she tries to walk about 30 minutes daily.  She said she often walks with a neighbor and enjoys socializing with neighbor as she walks Reviewed medication procurement for client Reviewed health needs of her spouse. She said her spouse has Dementia and is requiring more daily support.  She said she was transporting her spouse to and from medical appointment today.  She said she is adjusting more to the daily care needs of her spouse. She has strong family support. She has relatives who visit weekly to play scrabble. She tries to socialize with family/ friends as able. .   Client reported that she is doing fairly well. She is walking daily, takes medications as prescribed, is sleeping adequately, and did not mention any appetite issues Encouraged client to call LCSW as needed for social work support.  Patient Self Care Activities :   Patient is able to do daily ADLs as needed Patient is able to attend scheduled medical appointments Patient is able to take medications as prescribed.   Patient Deficits Anxiety issues Stress issues  Patient Goals. In next 30 days, patient will Attend scheduled medical appointments Take medications as prescribed Call RNCM or LCSW as needed for CCM support  Follow Up Plan:  LCSW to call client on 11/22/21 at 1:00 PM      [name].Hadar Elgersma MSW, [place] Holiday representative [facility] Care Management [phone]

## 2021-10-18 NOTE — Patient Instructions (Addendum)
Visit Information  Patient Goals:   Manage Emotions. Manage anxiety and stress issues faced  Timeframe:  Short-Term Goal Priority:  Medium Progress: On Track Start Date:           07/19/21     Expected End Date:      01/15/22                   Follow Up Date  11/22/21 at 1:00 PM     Manage Emotions:  Manage anxiety and stress issues faced    Why is this important?   When you are stressed, down or upset, your body reacts too.  For example, your blood pressure may get higher; you may have a headache or stomachache.  When your emotions get the best of you, your body's ability to fight off cold and flu gets weak.  These steps will help you manage your emotions.     Patient Self Care Activities :   Patient is able to do daily ADLs as needed Patient is able to attend scheduled medical appointments Patient is able to take medications as prescribed.   Patient Deficits Anxiety issues Stress issues  Patient Goals. In next 30 days, patient will Attend scheduled medical appointments Take medications as prescribed Call RNCM or LCSW as needed for CCM support  Follow Up Plan:  LCSW to call client on 11/22/21 at 1:00 PM   [name].Patrisia Faeth MSW, [place] Holiday representative [facility] Care Management [phone]

## 2021-10-20 DIAGNOSIS — D649 Anemia, unspecified: Secondary | ICD-10-CM

## 2021-10-20 DIAGNOSIS — E7800 Pure hypercholesterolemia, unspecified: Secondary | ICD-10-CM

## 2021-10-20 DIAGNOSIS — I10 Essential (primary) hypertension: Secondary | ICD-10-CM

## 2021-11-14 DIAGNOSIS — Z1231 Encounter for screening mammogram for malignant neoplasm of breast: Secondary | ICD-10-CM | POA: Diagnosis not present

## 2021-11-22 ENCOUNTER — Ambulatory Visit (INDEPENDENT_AMBULATORY_CARE_PROVIDER_SITE_OTHER): Payer: Medicare Other | Admitting: Licensed Clinical Social Worker

## 2021-11-22 DIAGNOSIS — D649 Anemia, unspecified: Secondary | ICD-10-CM

## 2021-11-22 DIAGNOSIS — Z636 Dependent relative needing care at home: Secondary | ICD-10-CM

## 2021-11-22 DIAGNOSIS — E7800 Pure hypercholesterolemia, unspecified: Secondary | ICD-10-CM

## 2021-11-22 DIAGNOSIS — M1612 Unilateral primary osteoarthritis, left hip: Secondary | ICD-10-CM

## 2021-11-22 DIAGNOSIS — I10 Essential (primary) hypertension: Secondary | ICD-10-CM

## 2021-11-22 NOTE — Patient Instructions (Addendum)
Visit Information  Patient Goals:  Manage Emotions.Manage anxiety and stress issues faced  Timeframe:  Short-Term Goal Priority:  Medium Progress: On Track Start Date:           07/19/21     Expected End Date:      01/15/22                   Follow Up Date  LCSW is discharging client today from [place].      Manage Emotions:  Manage anxiety and stress issues faced    Why is this important?   When you are stressed, down or upset, your body reacts too.  For example, your blood pressure may get higher; you may have a headache or stomachache.  When your emotions get the best of you, your body's ability to fight off cold and flu gets weak.  These steps will help you manage your emotions.     Patient Self Care Activities :   Patient is able to do daily ADLs as needed Patient is able to attend scheduled medical appointments Patient is able to take medications as prescribed.   Patient Deficits Anxiety issues Stress issues  Patient Goals. In next 30 days, patient will Attend scheduled medical appointments Take medications as prescribed Call RNCM or LCSW as needed for CCM support  Follow Up Plan:  LCSW is discharging client today from [place]. Client agreed to this plan  [name].Verner Mccrone MSW, [place] Holiday representative [facility] Care Management [phone]

## 2021-11-22 NOTE — Chronic Care Management (AMB) (Signed)
Chronic Care Management    Clinical Social Work Note  11/22/2021 Name: Ashley Holland MRN: [id number] DOB: 07/17/1940  Ashley Holland is a 81 y.o. year old female who is a primary care patient of [name], DO. The CCM team was consulted to assist the patient with chronic disease management and/or care coordination needs related to: [employer] .   Engaged with patient by telephone for follow up visit in response to provider referral for social work chronic care management and care coordination services.   Consent to Services:  The patient was given information about Chronic Care Management services, agreed to services, and gave verbal consent prior to initiation of services.  Please see initial visit note for detailed documentation.   Patient agreed to services and consent obtained.   Assessment: Review of patient past medical history, allergies, medications, and health status, including review of relevant consultants reports was performed today as part of a comprehensive evaluation and provision of chronic care management and care coordination services.     SDOH (Social Determinants of Health) assessments and interventions performed:  SDOH Interventions    Flowsheet Row Most Recent Value  SDOH Interventions   Physical Activity Interventions Other (Comments)  [she may have occasional pain in walking]  Stress Interventions Provide Counseling  [client has stress related to managing care needs of her spouse]        Advanced Directives Status: See Vynca application for related entries.  CCM Care Plan  No Known Allergies  Outpatient Encounter Medications as of 11/22/2021  Medication Sig   amLODipine (NORVASC) 5 MG tablet TAKE 1 TABLET BY MOUTH EVERY DAY   ARTIFICIAL TEAR OP Place 1 drop into both eyes daily as needed (dry eyes).   diphenhydrAMINE (BENADRYL) 25 MG tablet Take 25 mg by mouth daily as needed for itching.   hydrochlorothiazide (MICROZIDE) 12.5 MG  capsule TAKE 1 CAPSULE BY MOUTH EVERY DAY   No facility-administered encounter medications on file as of 11/22/2021.    Patient Active Problem List   Diagnosis Date Noted   S/P total left hip arthroplasty 10/12/2020   Pain of left hip joint 07/06/2020   Iron deficiency anemia secondary to inadequate dietary iron intake 06/18/2018   Colon polyp 03/07/2015   Weight loss 03/07/2015   Nausea 03/07/2015   OA (osteoarthritis) of hip 07/26/2014   Autoimmune thyroiditis 02/21/2014   Family history of thyroid cancer 02/21/2014   Malignant neoplasm of right breast ([place]) 12/10/2011   Arthritis/joint pain 09/22/2010   Menopause 09/22/2010   HTN (hypertension) 08/21/2010   Precordial pain    Hyperlipidemia     Conditions to be addressed/monitored: monitor client management of anxiety or stress issues  Care Plan : LCSW Care Plan  Updates made by [name], LCSW since 11/22/2021 12:00 AM     Problem: Emotional Distress      Goal: Emotional Health Supported; Manage anxiety and stress issues faced   Start Date: 07/19/2021  Expected End Date: 01/15/2022  This Visit's Progress: On track  Recent Progress: On track  Priority: Medium  Note:   CARE PLAN ENTRY  Current Barriers:  Mental Health challenges in managing anxiety and stress issues faced   Challenges in managing the care needs of her spouse  Clinical Social Work Goal(s):  Over the next 30 days, patient will work with SW to manage anxiety and stress issues faced.  Patient will practice self care activities in next 30 days Patient will attend scheduled medical appointments in  next 30 days  Interventions:  1:1 collaboration with Dr. [name] regarding development and update of comprehensive plan of care as evidenced by provider attestation and co-signature  Reviewed with client her ambulation challenges. She said she is walking without use of device. She said she tries to walk about 30 minutes daily.  She said she  often walks with a neighbor and enjoys socializing with neighbor as she walks Reviewed medication procurement for client Reviewed health needs of her spouse. She said her spouse has Dementia and is requiring more daily support.  She said she is adjusting more to the daily care needs of her spouse. She has strong family support. She has relatives who visit weekly to play scrabble. She tries to socialize with family/ friends as able. .   Client reported that she is doing fairly well. She is walking daily, takes medications as prescribed, is sleeping adequately, and did not mention any appetite issues Provided counseling support for client.  Discussed with [place] that she appears stable related to SW needs. Philip agreed that she is stable related to her SW needs. Thus, LCSW informed client that LCSW would discharge client today from [place] since goals were met and she is stable related to SW needs. Sky agreed to this plan.  LCSW also talked with client about Care Coordination program support at [facility] and informed her that she could talk with Dr. Lajuana Ripple in the future if she felt she needed support with Care Coordination program. Client has said that managing needs of her spouse is challenging LCSW thanked [place] for participating in CCM SW support services  Patient Self Care Activities :   Patient is able to do daily ADLs as needed Patient is able to attend scheduled medical appointments Patient is able to take medications as prescribed.   Patient Deficits Anxiety issues Stress issues  Patient Goals. In next 30 days, patient will Attend scheduled medical appointments Take medications as prescribed Call RNCM or LCSW as needed for CCM support  Follow Up Plan:  LCSW is discharging client today from [place]. Client agreed to this plan.      [name].[name] MSW, [place] Holiday representative [facility] Care Management [phone]

## 2021-12-03 IMAGING — XA DG HIP (WITH PELVIS) OPERATIVE*L*
1 series · 4 of 4 positions shown · non-contrast
Comparison: June 17, 2020.

CLINICAL DATA: Left anterior hip.

EXAM:
OPERATIVE LEFT HIP (WITH PELVIS IF PERFORMED) 4 VIEWS
TECHNIQUE: Fluoroscopic spot image(s) were submitted for interpretation
post-operatively.

[Series 1: unknown protocol · 4 of 4 slices shown]
[im 1/4]
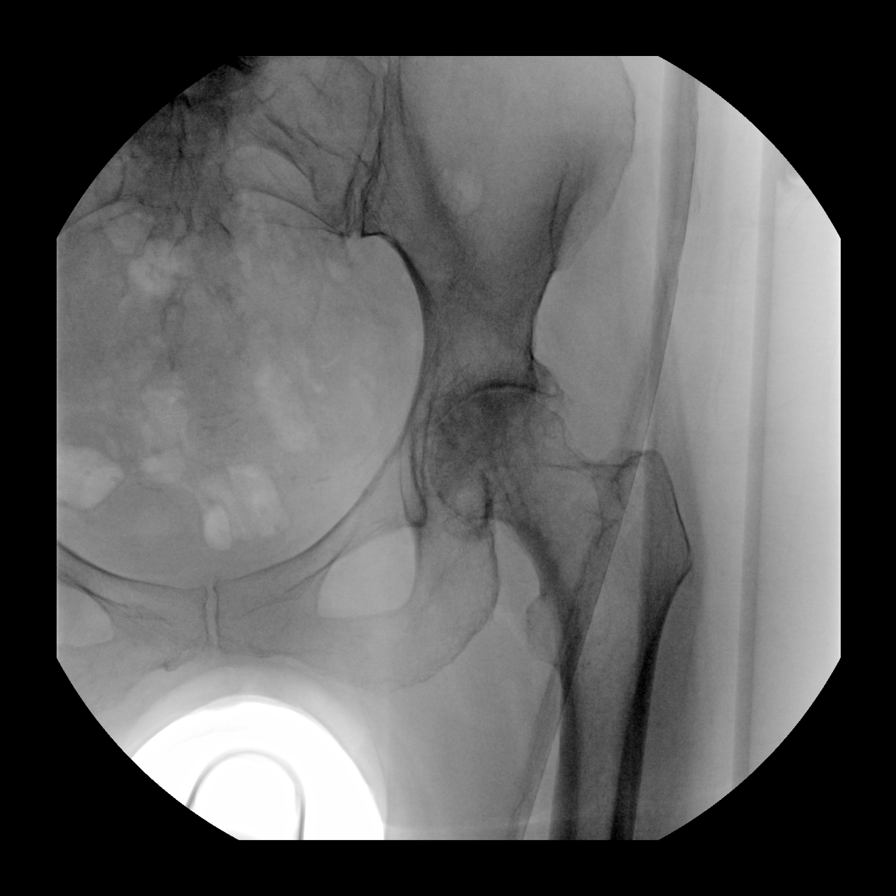
[im 2/4]
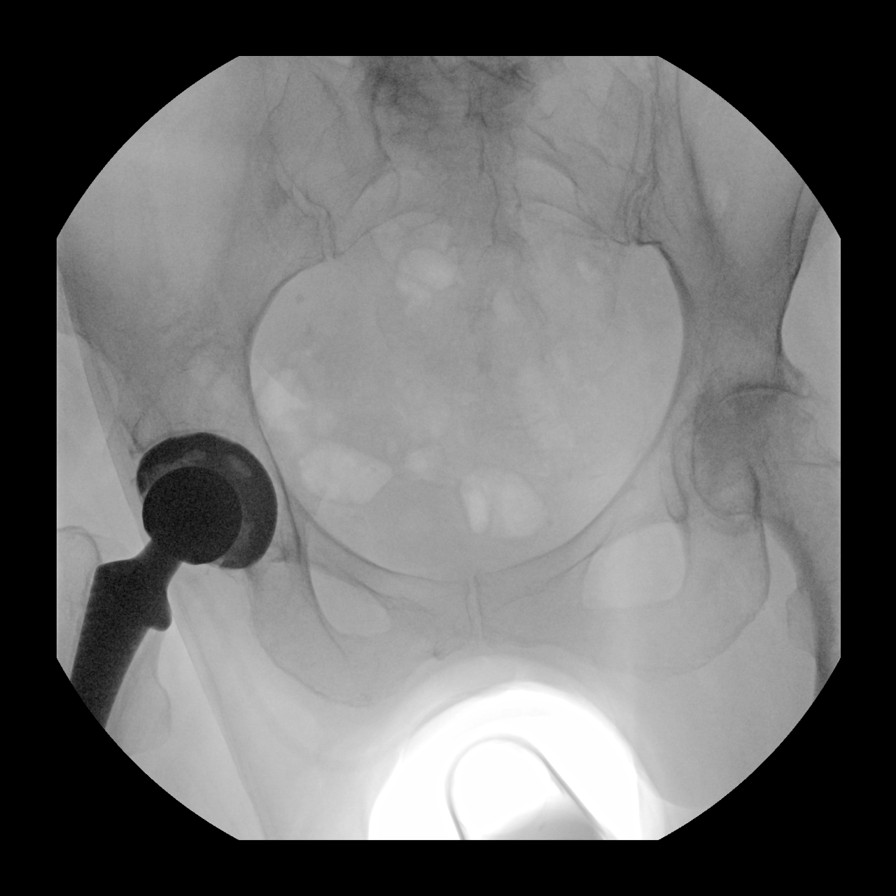
[im 3/4]
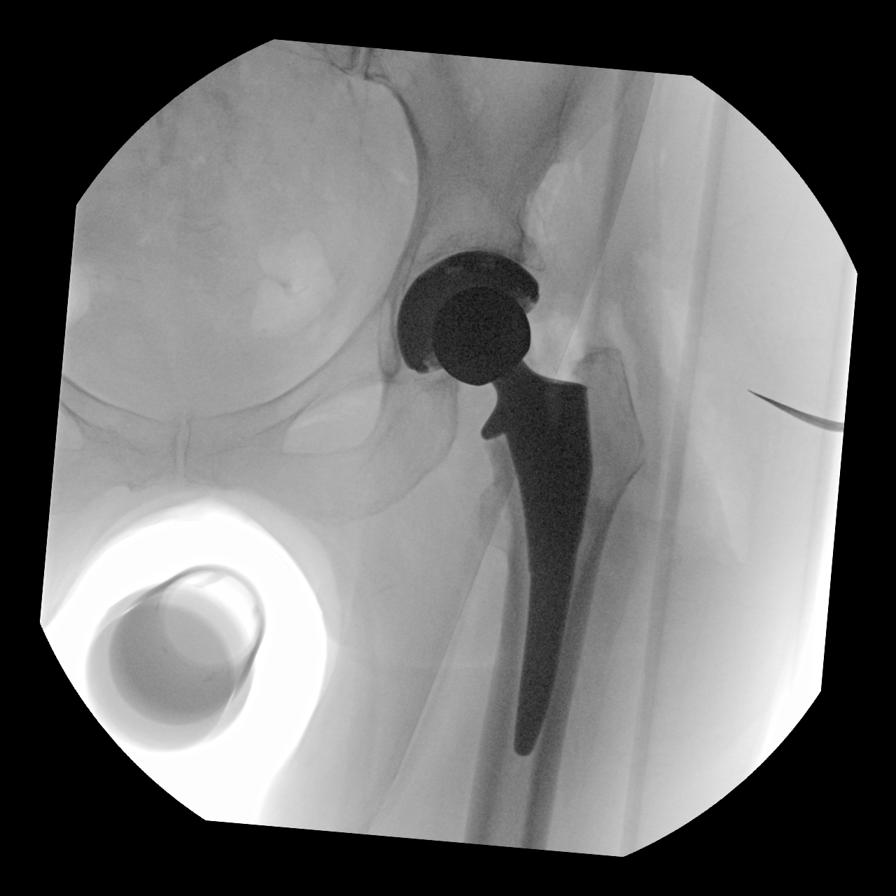
[im 4/4]
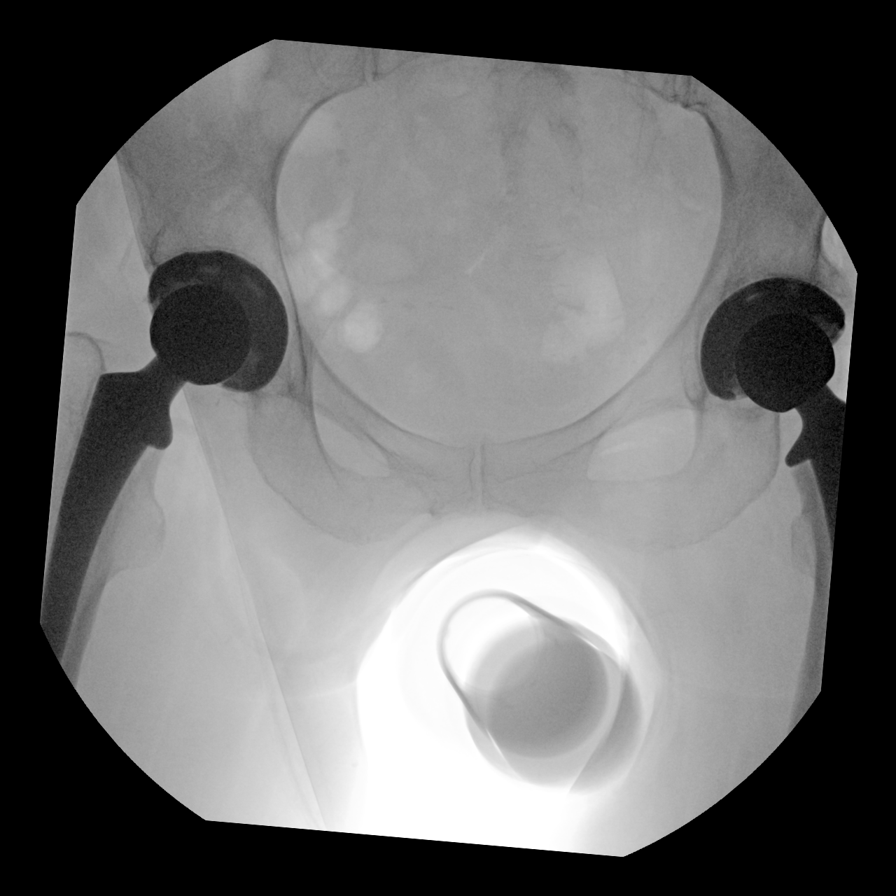

[4 of 4 positions shown; findings below may reference images not displayed]

FINDINGS: Fluoro time: 6 seconds.

Four C-arm fluoroscopic images were obtained intraoperatively and
submitted for post operative interpretation. These images
demonstrate postsurgical changes of left total hip arthroplasty. No
unexpected findings. Partially imaged prior right total hip
arthroplasty. Please see the performing provider's procedural report
for further detail.
IMPRESSION: Left total hip arthroplasty.

## 2021-12-21 DIAGNOSIS — D649 Anemia, unspecified: Secondary | ICD-10-CM

## 2021-12-21 DIAGNOSIS — I10 Essential (primary) hypertension: Secondary | ICD-10-CM

## 2021-12-21 DIAGNOSIS — M1612 Unilateral primary osteoarthritis, left hip: Secondary | ICD-10-CM

## 2021-12-21 DIAGNOSIS — E7800 Pure hypercholesterolemia, unspecified: Secondary | ICD-10-CM

## 2021-12-26 ENCOUNTER — Ambulatory Visit: Payer: Medicare Other | Admitting: Family Medicine

## 2021-12-28 DIAGNOSIS — Z471 Aftercare following joint replacement surgery: Secondary | ICD-10-CM | POA: Diagnosis not present

## 2021-12-28 DIAGNOSIS — Z96642 Presence of left artificial hip joint: Secondary | ICD-10-CM | POA: Diagnosis not present

## 2022-01-19 ENCOUNTER — Ambulatory Visit: Payer: Medicare Other | Admitting: Family Medicine

## 2022-01-19 DIAGNOSIS — N8111 Cystocele, midline: Secondary | ICD-10-CM | POA: Diagnosis not present

## 2022-02-02 ENCOUNTER — Other Ambulatory Visit: Payer: Self-pay | Admitting: Family Medicine

## 2022-02-02 DIAGNOSIS — I10 Essential (primary) hypertension: Secondary | ICD-10-CM

## 2022-02-13 ENCOUNTER — Ambulatory Visit (INDEPENDENT_AMBULATORY_CARE_PROVIDER_SITE_OTHER): Payer: Medicare Other | Admitting: Family Medicine

## 2022-02-13 ENCOUNTER — Encounter: Payer: Self-pay | Admitting: Family Medicine

## 2022-02-13 VITALS — BP 124/66 | HR 84 | Temp 98.3°F | Ht 64.0 in | Wt 150.6 lb

## 2022-02-13 DIAGNOSIS — I10 Essential (primary) hypertension: Secondary | ICD-10-CM

## 2022-02-13 DIAGNOSIS — D508 Other iron deficiency anemias: Secondary | ICD-10-CM

## 2022-02-13 DIAGNOSIS — Z0000 Encounter for general adult medical examination without abnormal findings: Secondary | ICD-10-CM

## 2022-02-13 DIAGNOSIS — Z23 Encounter for immunization: Secondary | ICD-10-CM | POA: Diagnosis not present

## 2022-02-13 DIAGNOSIS — E7800 Pure hypercholesterolemia, unspecified: Secondary | ICD-10-CM | POA: Diagnosis not present

## 2022-02-13 DIAGNOSIS — Z636 Dependent relative needing care at home: Secondary | ICD-10-CM | POA: Diagnosis not present

## 2022-02-13 MED ORDER — AMLODIPINE BESYLATE 5 MG PO TABS: 5.0000 mg | ORAL_TABLET | Freq: Every day | ORAL | 3 refills | Status: DC

## 2022-02-13 MED ORDER — HYDROCHLOROTHIAZIDE 12.5 MG PO CAPS: 12.5000 mg | ORAL_CAPSULE | Freq: Every day | ORAL | 3 refills | Status: DC

## 2022-02-13 NOTE — Patient Instructions (Signed)
Alzheimer's Disease Caregiver Guide Alzheimer's disease is a brain disease that causes memory loss and changes in behavior. People with Alzheimer's disease often have problems paying attention, communicating, and doing routine tasks. The disease gets worse over time, and people with the disease eventually need full-time care. Taking care of someone with Alzheimer's disease can be challenging and overwhelming. This guide provides helpful information and tips that can make caring for someone with the disease a little easier. How to help manage lifestyle changes Managing symptoms Be calm and patient. Give short, simple answers to questions. Long answers can overwhelm and confuse the person. Avoid correcting the person in a negative way. Try not to take things personally, even if the person forgets your name. Understand that changes are a part of the disease process. Do not argue or try to convince the person about a specific point. Doing that may make the person feel more agitated. Reducing frustration Make appointments and do daily tasks, like bathing and dressing, when the person is at his or her best. Allow for plenty of time for simple tasks because they may take longer than expected. Take your time when doing these tasks. Limit the person's choices. Too many choices can be overwhelming and stressful for the person. Involve the person in what you are doing. Take steps to help keep things organized such as: Keeping a daily routine. Organizing medicines in a pillbox for each day of the week. Keeping a calendar in a central location to remind the person of appointments or other activities. Avoid crowds and new situations, if possible. Use simple words, short sentences, and a calm voice. Only give one direction at a time. Buy clothes and shoes for the person that are easy to put on and take off. Try to change the subject or topic if the person becomes frustrated or angry. Distraction is a great  technique for making a situation less tense. Reducing the risk of injury  Keep floors clear of clutter. Remove rugs, magazine racks, and floor lamps. Keep hallways well-lit, especially at night. Put a handrail and nonslip mat in the bathtub or shower. Put childproof locks on cabinets that contain dangerous items, such as medicines, alcohol, guns, toxic cleaning items, sharp tools or utensils, matches, and lighters. Put locks on doors. Put the locks in places where the person cannot see or reach them easily. This will help ensure that the person does not wander out of the house and get lost. Be prepared for emergencies. Keep a list of emergency phone numbers and addresses in a convenient area. Remove car keys and lock garage doors so that the person does not try to get in the car and drive. A certain type of bracelet may be worn that tracks a person's location and identifies him or her as having memory problems. This should be worn at all times for safety. Planning for the future  Discuss financial and legal planning early on in the course of the disease. People with Alzheimer's disease will have trouble managing their money as the disease gets worse. Get help from professional advisers regarding financial and legal matters. Discuss advance directives, safety, and daily care. Take these steps: Create a living will and choose a power of attorney. The person with power of attorney will be able to make decisions for the person with Alzheimer's disease when he or she is no longer able to. Discuss driving safety and when to stop driving. The person's health care provider can help provide assistance with this  decision. Discuss the person's living situation. If the person lives alone, make sure he or she is safe. People who live at home may need extra help from home health caregivers, and those who live in a nursing home or care center may need more care. How to recognize changes in the person's  condition Alzheimer's disease causes a person to lose the ability to remember things and make decisions. Memory loss and confusion are usually mild at the start of the disease, but they slowly get more severe over time. Eventually, the person may not recognize friends, family members, or familiar places. Alzheimer's disease can also cause hallucinations, changes in behavior, and changes in mood, such as anxiety or anger. The changes can come on suddenly. They may happen in response to something such as: Pain. An infection. Changes in environment, such as changes in temperature or noise. Overstimulation. Feeling lost or scared. Medicines. Where to find support Ask about respite care resources. Respite care can provide short-term care for the person so that you can have a regular break from the stress of caregiving. Consider joining a local support group. Advantages of being part of a support group include: Learning strategies to manage stress. Sharing experiences with others. Receiving emotional comfort and support. Learning about caregiving as the disease progresses. Knowing what community resources are available and making use of them. Where to find more information Alzheimer's Association: [employer] Contact a health care provider if: The person has a fever. The person has a sudden change in behavior that does not improve with calming strategies. The person is unable to manage in his or her current living situation. You are no longer able to care for the person. Get help right away if: The person has a sudden increase in confusion or new hallucinations. The person threatens himself or herself, you, or anyone else. If you ever feel like your loved one may hurt himself or herself or others, or shares thoughts about taking his or her own life, get help right away. You can go to your nearest emergency department or: Call your local emergency services (911 in the [place] Call a suicide  crisis helpline, such as the [facility] at 613-188-8776 or 988 in the [place]. This is open 24 hours a day in the [place] Text the Crisis Text Line at (559) 530-3854 (in the [place].). Summary Alzheimer's disease is a brain disease that causes memory loss and changes in behavior. People with Alzheimer's disease often have problems paying attention, communicating, and doing routine tasks. The disease gets worse over time, and people with the disease eventually need full-time care. Take steps to reduce the person's risk of injury and plan for future care. Taking care of someone with Alzheimer's disease can be very challenging and overwhelming. One way to find support during this time is to join a local support group. This information is not intended to replace advice given to you by your health care provider. Make sure you discuss any questions you have with your health care provider. Document Revised: 11/02/2020 Document Reviewed: 07/27/2019 Elsevier Patient Education  [place].

## 2022-02-13 NOTE — Progress Notes (Signed)
Ashley Holland is a 81 y.o. female presents to office today for annual physical exam examination.    Concerns today include: 1.  Caregiver stress Continues to go home with caregiver stress surrounding her husband, who has advanced dementia.  She does not feel threatened by her spouse but she does note that sometimes he has a temper.  She feels totally safe at home.  She just gets overwhelmed by him sometimes.   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, Marital status: Married, Substance use: None Diet: Balanced, Exercise: Stays physically active Last eye exam: Up-to-date Last colonoscopy: Up-to-date Last mammogram: Up to date Last pap smear: N/A Refills needed today: All Immunizations needed: Influenza Immunization History  Administered Date(s) Administered   Fluad Quad(high Dose 65+) 02/10/2019, 04/20/2020   Influenza Split 01/08/2013   Influenza, High Dose Seasonal PF 02/29/2016, 01/17/2018   Influenza,inj,Quad PF,6+ Mos 03/09/2014, 01/18/2015   Influenza,trivalent, recombinat, inj, PF 01/08/2013   Influenza-Unspecified 01/03/2013   Moderna Sars-Covid-2 Vaccination 06/17/2019, 07/15/2019, 03/02/2020   Pneumococcal Conjugate-13 10/20/2019   Pneumococcal-Unspecified 04/24/2007   Tdap 09/01/2018     Past Medical History:  Diagnosis Date   Arthritis    Per medical history form dated 11/04/09.past history DJD back   Enlarged thyroid    "was told had nodules" - no swallowing problems   Female bladder prolapse    GERD (gastroesophageal reflux disease) 05/19/2013   Malignant neoplasm of breast (female), unspecified site    right breast cancer. surgery, radiation, oral meds- Arimidex.   Nontoxic multinodular goiter 11/10/2015   Other and unspecified hyperlipidemia    Rectal prolapse    stool softeners helpful.   Unspecified essential hypertension    Social History   Socioeconomic History   Marital status: Married    Spouse name: John   Number of children: 2   Years of  education: Not on file   Highest education level: Not on file  Occupational History   Occupation: Retired    Comment: Control and instrumentation engineer  Tobacco Use   Smoking status: Never   Smokeless tobacco: Never  Vaping Use   Vaping Use: Never used  Substance and Sexual Activity   Alcohol use: No   Drug use: No   Sexual activity: Not Currently  Other Topics Concern   Not on file  Social History Narrative   One level home with her husband who has dementia -she is his caregiver   Social Determinants of Health   Financial Resource Strain: Low Risk  (03/06/2021)   Overall Financial Resource Strain (CARDIA)    Difficulty of Paying Living Expenses: Not hard at all  Food Insecurity: No Food Insecurity (03/06/2021)   Hunger Vital Sign    Worried About Running Out of Food in the Last Year: Never true    [place] in the Last Year: Never true  Transportation Needs: No Transportation Needs (03/06/2021)   PRAPARE - Hydrologist (Medical): No    Lack of Transportation (Non-Medical): No  Physical Activity: Insufficiently Active (11/22/2021)   Exercise Vital Sign    Days of Exercise per Week: 6 days    Minutes of Exercise per Session: 10 min  Stress: Stress Concern Present (11/22/2021)   [place]    Feeling of Stress : To some extent  Social Connections: Socially Integrated (03/06/2021)   Social Connection and Isolation Panel [NHANES]    Frequency of Communication with Friends and Family: More than three  times a week    Frequency of Social Gatherings with Friends and Family: More than three times a week    Attends Religious Services: More than 4 times per year    Active Member of Clubs or Organizations: Yes    Attends Archivist Meetings: More than 4 times per year    Marital Status: Married  Human resources officer Violence: Not At Risk (03/06/2021)   Humiliation, Afraid, Rape, and Kick  questionnaire    Fear of Current or Ex-Partner: No    Emotionally Abused: No    Physically Abused: No    Sexually Abused: No   Past Surgical History:  Procedure Laterality Date   ABDOMINAL HYSTERECTOMY     BREAST LUMPECTOMY  2010   Right   CHOLECYSTECTOMY     COLONOSCOPY     EUS  04/25/2011   Procedure: UPPER ENDOSCOPIC ULTRASOUND (EUS) RADIAL;  Surgeon: Landry Dyke, MD;  Location: [facility];  Service: Endoscopy;  Laterality: N/A;   TOTAL HIP ARTHROPLASTY Right 07/26/2014   Procedure: RIGHT TOTAL HIP ARTHROPLASTY ANTERIOR APPROACH;  Surgeon: Gaynelle Arabian, MD;  Location: [facility];  Service: Orthopedics;  Laterality: Right;   TOTAL HIP ARTHROPLASTY Left 10/12/2020   Procedure: TOTAL HIP ARTHROPLASTY ANTERIOR APPROACH;  Surgeon: Gaynelle Arabian, MD;  Location: [facility];  Service: Orthopedics;  Laterality: Left;   UPPER GASTROINTESTINAL ENDOSCOPY  Sept 2012   Family History  Problem Relation Age of Onset   Coronary artery disease Mother 37        CABG age 46   Hyperlipidemia Mother    Stroke Mother    Alcohol abuse Father    Hypertension Father    Hypertension Sister    Hypertension Brother    Arthritis Brother    Anesthesia problems Neg Hx    Hypotension Neg Hx    Malignant hyperthermia Neg Hx    Pseudochol deficiency Neg Hx     Current Outpatient Medications:    amLODipine (NORVASC) 5 MG tablet, TAKE 1 TABLET BY MOUTH EVERY DAY, Disp: 180 tablet, Rfl: 1   ARTIFICIAL TEAR OP, Place 1 drop into both eyes daily as needed (dry eyes)., Disp: , Rfl:    diphenhydrAMINE (BENADRYL) 25 MG tablet, Take 25 mg by mouth daily as needed for itching., Disp: , Rfl:    hydrochlorothiazide (MICROZIDE) 12.5 MG capsule, TAKE 1 CAPSULE BY MOUTH EVERY DAY, Disp: 90 capsule, Rfl: 0  No Known Allergies   ROS: Review of Systems Pertinent items noted in HPI and remainder of comprehensive ROS otherwise negative.    Physical exam BP 124/66   Pulse 84   Temp 98.3 F (36.8 C)   Ht 5' 4"  (1.626  m)   Wt 150 lb 9.6 oz (68.3 kg)   SpO2 97%   BMI 25.85 kg/m  General appearance: alert, cooperative, and appears stated age Head: Normocephalic, without obvious abnormality, atraumatic Eyes: negative findings: lids and lashes normal, conjunctivae and sclerae normal, corneas clear, and pupils equal, round, reactive to light and accomodation Ears: normal TM's and external ear canals both ears Nose: Nares normal. Septum midline. Mucosa normal. No drainage or sinus tenderness. Throat: lips, mucosa, and tongue normal; teeth and gums normal Neck: no adenopathy, no carotid bruit, supple, symmetrical, trachea midline, and thyroid not enlarged, symmetric, no tenderness/mass/nodules Back: symmetric, no curvature. ROM normal. No CVA tenderness. Lungs: clear to auscultation bilaterally Heart: regular rate and rhythm, S1, S2 normal, no murmur, click, rub or gallop Abdomen: soft, non-tender; bowel sounds normal; no  masses,  no organomegaly Extremities: extremities normal, atraumatic, no cyanosis or edema Pulses: 2+ and symmetric Skin: Skin color, texture, turgor normal. No rashes or lesions Lymph nodes: Cervical, supraclavicular, and axillary nodes normal. Neurologic: Grossly normal Psych: Mood stable, speech normal, affect appropriate   Assessment/ Plan: Ashley Holland here for annual physical exam.   Primary hypertension - Plan: CMP14+EGFR, amLODipine (NORVASC) 5 MG tablet, hydrochlorothiazide (MICROZIDE) 12.5 MG capsule  Need for immunization against influenza - Plan: Flu Vaccine QUAD High Dose(Fluad)  Annual physical exam  Pure hypercholesterolemia - Plan: Lipid Panel, CMP14+EGFR, TSH  Iron deficiency anemia secondary to inadequate dietary iron intake - Plan: CBC  Caregiver stress  Blood pressure is well controlled.  No changes.  Check fasting labs.  Up-to-date on preventative health care.  Influenza vaccination administered today.  Counseled on ways to improve caregiver stress.  At  this time she is not interested in any type of medication  Counseled on healthy lifestyle choices, including diet (rich in fruits, vegetables and lean meats and low in salt and simple carbohydrates) and exercise (at least 30 minutes of moderate physical activity daily).  Patient to follow up in 1 year for annual exam or sooner if needed.  Azelie Noguera M. Lajuana Ripple, DO

## 2022-02-14 LAB — CMP14+EGFR
ALT: 11 IU/L (ref 0–32)
AST: 27 IU/L (ref 0–40)
Albumin/Globulin Ratio: 1.8 (ref 1.2–2.2)
Albumin: 4.6 g/dL (ref 3.7–4.7)
Alkaline Phosphatase: 111 IU/L (ref 44–121)
BUN/Creatinine Ratio: 14 (ref 12–28)
BUN: 11 mg/dL (ref 8–27)
Bilirubin Total: 0.5 mg/dL (ref 0.0–1.2)
CO2: 25 mmol/L (ref 20–29)
Calcium: 10.3 mg/dL (ref 8.7–10.3)
Chloride: 103 mmol/L (ref 96–106)
Creatinine, Ser: 0.76 mg/dL (ref 0.57–1.00)
Globulin, Total: 2.5 g/dL (ref 1.5–4.5)
Glucose: 80 mg/dL (ref 70–99)
Potassium: 4.1 mmol/L (ref 3.5–5.2)
Sodium: 143 mmol/L (ref 134–144)
Total Protein: 7.1 g/dL (ref 6.0–8.5)
eGFR: 78 mL/min/{1.73_m2} (ref >59)

## 2022-02-14 LAB — CBC
Hematocrit: 41.9 % (ref 34.0–46.6)
Hemoglobin: 14.7 g/dL (ref 11.1–15.9)
MCH: 30.9 pg (ref 26.6–33.0)
MCHC: 35.1 g/dL (ref 31.5–35.7)
MCV: 88 fL (ref 79–97)
Platelets: 327 10*3/uL (ref 150–450)
RBC: 4.76 x10E6/uL (ref 3.77–5.28)
RDW: 13.1 % (ref 11.7–15.4)
WBC: 8 10*3/uL (ref 3.4–10.8)

## 2022-02-14 LAB — LIPID PANEL
Chol/HDL Ratio: 2.7 ratio (ref 0.0–4.4)
Cholesterol, Total: 221 mg/dL — ABNORMAL HIGH (ref 100–199)
HDL: 81 mg/dL (ref >39)
LDL Chol Calc (NIH): 127 mg/dL — ABNORMAL HIGH (ref 0–99)
Triglycerides: 72 mg/dL (ref 0–149)
VLDL Cholesterol Cal: 13 mg/dL (ref 5–40)

## 2022-02-14 LAB — TSH: TSH: 0.809 u[IU]/mL (ref 0.450–4.500)

## 2022-02-16 DIAGNOSIS — N993 Prolapse of vaginal vault after hysterectomy: Secondary | ICD-10-CM | POA: Diagnosis not present

## 2022-03-12 ENCOUNTER — Other Ambulatory Visit: Payer: Self-pay

## 2022-03-12 ENCOUNTER — Emergency Department (HOSPITAL_COMMUNITY)
Admission: EM | Admit: 2022-03-12 | Discharge: 2022-03-12 | Disposition: A | Discharge status: Home / Self Care | Payer: Medicare Other | Attending: Emergency Medicine | Admitting: Emergency Medicine

## 2022-03-12 ENCOUNTER — Emergency Department (HOSPITAL_COMMUNITY): Payer: Medicare Other

## 2022-03-12 ENCOUNTER — Encounter (HOSPITAL_COMMUNITY): Payer: Self-pay | Admitting: *Deleted

## 2022-03-12 DIAGNOSIS — Z79899 Other long term (current) drug therapy: Secondary | ICD-10-CM | POA: Insufficient documentation

## 2022-03-12 DIAGNOSIS — Y92093 Driveway of other non-institutional residence as the place of occurrence of the external cause: Secondary | ICD-10-CM | POA: Diagnosis not present

## 2022-03-12 DIAGNOSIS — S0083XA Contusion of other part of head, initial encounter: Secondary | ICD-10-CM | POA: Insufficient documentation

## 2022-03-12 DIAGNOSIS — W0110XA Fall on same level from slipping, tripping and stumbling with subsequent striking against unspecified object, initial encounter: Secondary | ICD-10-CM | POA: Diagnosis not present

## 2022-03-12 DIAGNOSIS — S0033XA Contusion of nose, initial encounter: Secondary | ICD-10-CM | POA: Insufficient documentation

## 2022-03-12 DIAGNOSIS — S0993XA Unspecified injury of face, initial encounter: Secondary | ICD-10-CM | POA: Diagnosis present

## 2022-03-12 DIAGNOSIS — I10 Essential (primary) hypertension: Secondary | ICD-10-CM | POA: Diagnosis not present

## 2022-03-12 DIAGNOSIS — W19XXXA Unspecified fall, initial encounter: Secondary | ICD-10-CM

## 2022-03-12 DIAGNOSIS — S0003XA Contusion of scalp, initial encounter: Secondary | ICD-10-CM | POA: Diagnosis not present

## 2022-03-12 NOTE — Discharge Instructions (Signed)
You have been seen today for your complaint of fall. Your imaging was reassuring and showed no abnormalities. Home care instructions are as follows:  Take care not to fall again.  You may apply ice to the affected area for 15 minutes at a time for swelling control. Follow up with: Your primary care provider soon as possible Please seek immediate medical care if you develop any of the following symptoms: You have severe pain or a headache that is not relieved by medicine. You have unusual sleepiness, confusion, or personality changes. You vomit. You have a nosebleed that does not stop. You have double vision or blurred vision. You have clear fluid draining from your nose or ear, and it does not go away. You have trouble walking or using your arms or legs. You have severe dizziness. At this time there does not appear to be the presence of an emergent medical condition, however there is always the potential for conditions to change. Please read and follow the below instructions.  Do not take your medicine if  develop an itchy rash, swelling in your mouth or lips, or difficulty breathing; call 911 and seek immediate emergency medical attention if this occurs.  You may review your lab tests and imaging results in their entirety on your MyChart account.  Please discuss all results of fully with your primary care provider and other specialist at your follow-up visit.  Note: Portions of this text may have been transcribed using voice recognition software. Every effort was made to ensure accuracy; however, inadvertent computerized transcription errors may still be present.

## 2022-03-12 NOTE — ED Provider Notes (Signed)
[facility] EMERGENCY DEPARTMENT Provider Note   CSN: [id number] Arrival date & time: 03/12/22  1117     History  Chief Complaint  Patient presents with   Lytle Michaels    Ashley Holland is a 81 y.o. female.  With history of hypertension, arthritis, GERD who presents ED for evaluation of a fall.  Patient states that approximately 730 this morning she was walking up her driveway when she crossed to the other side and substernally.  States she fell forward onto her face and hands.  Did not lose consciousness.  Not on a blood thinner.  States she had a headache afterwards, but was relieved with ice to her forehead.  States she was sitting at home when her daughter came to visit and noticed the bruising to her face and urged her to come for further evaluation.  Denies blurred vision, vision changes, numbness, weakness, tingling, headaches, dizziness, lightheadedness, neck pain, back pain, facial pain.   Fall       Home Medications Prior to Admission medications   Medication Sig Start Date End Date Taking? Authorizing Provider  amLODipine (NORVASC) 5 MG tablet Take 1 tablet (5 mg total) by mouth daily. 02/13/22   [name], DO  ARTIFICIAL TEAR OP Place 1 drop into both eyes daily as needed (dry eyes).    [provider]  diphenhydrAMINE (BENADRYL) 25 MG tablet Take 25 mg by mouth daily as needed for itching.    [provider]  hydrochlorothiazide (MICROZIDE) 12.5 MG capsule Take 1 capsule (12.5 mg total) by mouth daily. 02/13/22   [name], DO      Allergies    Patient has no known allergies.    Review of Systems   Review of Systems  Musculoskeletal:  Positive for arthralgias.  All other systems reviewed and are negative.   Physical Exam Updated Vital Signs BP (!) 142/82   Pulse 82   Temp 98.2 F (36.8 C) (Oral)   Resp 18   Ht '5\' 4"'$  (1.626 m)   Wt 68 kg   SpO2 98%   BMI 25.75 kg/m  Physical Exam Vitals and nursing note reviewed.   Constitutional:      General: She is not in acute distress.    Appearance: Normal appearance. She is well-developed and normal weight. She is not ill-appearing, toxic-appearing or diaphoretic.  HENT:     Head: Normocephalic.      Comments: Bruising to nose and bilateral nasolacrimal areas Eyes:     Extraocular Movements: Extraocular movements intact.     Conjunctiva/sclera: Conjunctivae normal.     Pupils: Pupils are equal, round, and reactive to light.     Comments: No nystagmus  Cardiovascular:     Rate and Rhythm: Normal rate and regular rhythm.     Heart sounds: No murmur heard. Pulmonary:     Effort: Pulmonary effort is normal. No respiratory distress.     Breath sounds: Normal breath sounds.  Abdominal:     Palpations: Abdomen is soft.     Tenderness: There is no abdominal tenderness.  Musculoskeletal:        General: No swelling.     Cervical back: Normal, normal range of motion and neck supple. No tenderness.     Thoracic back: Normal.     Lumbar back: Normal.  Skin:    General: Skin is warm and dry.     Capillary Refill: Capillary refill takes less than 2 seconds.  Neurological:     General:  No focal deficit present.     Mental Status: She is alert and oriented to person, place, and time.     Comments: No unilateral or global weakness, pronator drift, facial asymmetry, slurred speech.  Finger-to-nose and heel-to-shin normal.  Sensation intact.  Psychiatric:        Mood and Affect: Mood normal.     ED Results / Procedures / Treatments   Labs (all labs ordered are listed, but only abnormal results are displayed) Labs Reviewed - No data to display  EKG None  Radiology CT Maxillofacial WO CM  Result Date: 03/12/2022 CLINICAL DATA:  Head trauma, minor (Age >= 65y); Facial trauma, blunt EXAM: CT HEAD WITHOUT CONTRAST CT MAXILLOFACIAL WITHOUT CONTRAST TECHNIQUE: Multidetector CT imaging of the head and maxillofacial structures were performed using the standard  protocol without intravenous contrast. Multiplanar CT image reconstructions of the maxillofacial structures were also generated. RADIATION DOSE REDUCTION: This exam was performed according to the departmental dose-optimization program which includes automated exposure control, adjustment of the mA and/or kV according to patient size and/or use of iterative reconstruction technique. COMPARISON:  None Available. FINDINGS: CT HEAD FINDINGS BRAIN: BRAIN Patchy and confluent areas of decreased attenuation are noted throughout the deep and periventricular white matter of the cerebral hemispheres bilaterally, compatible with chronic microvascular ischemic disease. No evidence of large-territorial acute infarction. No parenchymal hemorrhage. No mass lesion. No extra-axial collection. No mass effect or midline shift. No hydrocephalus. Basilar cisterns are patent. Vascular: No hyperdense vessel. Atherosclerotic calcifications are present within the cavernous internal carotid arteries. Skull: No acute fracture or focal lesion. Midline frontal scalp benign appearing likely osteoma formation (5:31, 3:48). Other: Bilateral, left greater than right, frontal scalp hematoma formation measuring up to 1.1 cm. CT MAXILLOFACIAL FINDINGS Osseous: No fracture or mandibular dislocation. No destructive process. Sinuses/Orbits: Paranasal sinuses and mastoid air cells are clear. The orbits are unremarkable. Soft tissues: A 1.6 cm well-defined soft tissue density along the right parieto-occipital scalp. IMPRESSION: 1. No acute intracranial abnormality. 2. No acute displaced facial fracture. 3. Bilateral, left greater than right, frontal scalp hematoma formation measuring up to 1.1 cm. 4. A 1.6 cm well-defined soft tissue density along the right parieto-occipital scalp. Correlate clinically. Finding may represent a sebaceous cyst. Electronically Signed   By: Iven Finn M.D.   On: 03/12/2022 17:58   CT Head Wo Contrast  Result Date:  03/12/2022 CLINICAL DATA:  Head trauma, minor (Age >= 65y); Facial trauma, blunt EXAM: CT HEAD WITHOUT CONTRAST CT MAXILLOFACIAL WITHOUT CONTRAST TECHNIQUE: Multidetector CT imaging of the head and maxillofacial structures were performed using the standard protocol without intravenous contrast. Multiplanar CT image reconstructions of the maxillofacial structures were also generated. RADIATION DOSE REDUCTION: This exam was performed according to the departmental dose-optimization program which includes automated exposure control, adjustment of the mA and/or kV according to patient size and/or use of iterative reconstruction technique. COMPARISON:  None Available. FINDINGS: CT HEAD FINDINGS BRAIN: BRAIN Patchy and confluent areas of decreased attenuation are noted throughout the deep and periventricular white matter of the cerebral hemispheres bilaterally, compatible with chronic microvascular ischemic disease. No evidence of large-territorial acute infarction. No parenchymal hemorrhage. No mass lesion. No extra-axial collection. No mass effect or midline shift. No hydrocephalus. Basilar cisterns are patent. Vascular: No hyperdense vessel. Atherosclerotic calcifications are present within the cavernous internal carotid arteries. Skull: No acute fracture or focal lesion. Midline frontal scalp benign appearing likely osteoma formation (5:31, 3:48). Other: Bilateral, left greater than right, frontal scalp hematoma formation  measuring up to 1.1 cm. CT MAXILLOFACIAL FINDINGS Osseous: No fracture or mandibular dislocation. No destructive process. Sinuses/Orbits: Paranasal sinuses and mastoid air cells are clear. The orbits are unremarkable. Soft tissues: A 1.6 cm well-defined soft tissue density along the right parieto-occipital scalp. IMPRESSION: 1. No acute intracranial abnormality. 2. No acute displaced facial fracture. 3. Bilateral, left greater than right, frontal scalp hematoma formation measuring up to 1.1 cm. 4. A  1.6 cm well-defined soft tissue density along the right parieto-occipital scalp. Correlate clinically. Finding may represent a sebaceous cyst. Electronically Signed   By: Iven Finn M.D.   On: 03/12/2022 17:58    Procedures Procedures    Medications Ordered in ED Medications - No data to display  ED Course/ Medical Decision Making/ A&P Clinical Course as of 03/12/22 1937  Mon Mar 12, 2022  1917 CT Head Wo Contrast I personally viewed the images.  No acute intracranial abnormalities. [AS]  1918 CT Maxillofacial WO CM I personally reviewed the image.  No acute maxillofacial fractures [AS]    Clinical Course User Index [AS] Claudie Fisherman Grafton Folk, PA-C                           Medical Decision Making Amount and/or Complexity of Data Reviewed Radiology: ordered. Decision-making details documented in ED Course.  This patient presents to the ED for concern of fall, this involves an extensive number of treatment options, and is a complaint that carries with it a high risk of complications and morbidity.  The differential diagnosis includes facial contusion, cranial fracture, intracranial abnormality including hemorrhage   Co morbidities that complicate the patient evaluation  hypertension, arthritis, GERD  My initial workup includes CT head and maxillofacial  Additional history obtained from: Nursing notes from this visit.  I ordered imaging studies including CT head and maxillofacial I independently visualized and interpreted imaging which showed no acute intracranial or maxillofacial abnormalities I agree with the radiologist interpretation  Afebrile, hypertensive but at baseline.  Patient is an 81 year old female presenting to the ED for evaluation of a fall.  Fall occurred at 730 this morning.  There was no loss of consciousness.  Patient is not on a blood thinner.  Physical exam remarkable for bruising to the bilateral nasal lacrimal area, is otherwise unremarkable including  an unremarkable neurologic exam.  CT of the head and maxillofacial was unremarkable.  Patient is asymptomatic.  I have low suspicion for acute intracranial emergencies at this time.  Encourage patient to follow-up with her primary care provider soon as possible.  Gave patient strict return precautions.  Stable at discharge.  At this time there does not appear to be any evidence of an acute emergency medical condition and the patient appears stable for discharge with appropriate outpatient follow up. Diagnosis was discussed with patient who verbalizes understanding of care plan and is agreeable to discharge. I have discussed return precautions with patient who verbalizes understanding. Patient encouraged to follow-up with their PCP within 1 week. All questions answered.  Patient's case discussed with Dr. Langston Masker who agrees with plan to discharge with follow-up.   Note: Portions of this report may have been transcribed using voice recognition software. Every effort was made to ensure accuracy; however, inadvertent computerized transcription errors may still be present.          Final Clinical Impression(s) / ED Diagnoses Final diagnoses:  Fall, initial encounter  Contusion of face, initial encounter    Rx /  DC Orders ED Discharge Orders     None         Nehemiah Massed 03/12/22 1938    Wyvonnia Dusky, MD 03/12/22 [phone]

## 2022-03-12 NOTE — ED Notes (Signed)
Patient verbalizes understanding of discharge instructions. Opportunity for questioning and answers were provided. Armband removed by staff, pt discharged from ED. Ambulated out to lobby, left with sister

## 2022-03-12 NOTE — ED Triage Notes (Signed)
Pt tripped up the driveway and fell on cement driveway, small cut and swelling noted above left eye and scratches to nose.  Denies any LOC. Denies taking blood thinners.

## 2022-03-21 ENCOUNTER — Telehealth: Payer: Self-pay

## 2022-03-21 NOTE — Telephone Encounter (Signed)
     Patient  visit on 11/19  at [place] you been able to follow up with your primary care physician? Yes   The patient was or was not able to obtain any needed medicine or equipment. Yes   Are there diet recommendations that you are having difficulty following? Na   Patient expresses understanding of discharge instructions and education provided has no other needs at this time.  Yes      [place], [facility], Care Management  [phone] [address], [place], [place] [address] Phone: [phone] Email: [email or web address]

## 2022-03-27 ENCOUNTER — Ambulatory Visit (INDEPENDENT_AMBULATORY_CARE_PROVIDER_SITE_OTHER): Payer: Medicare Other | Admitting: Family Medicine

## 2022-03-27 ENCOUNTER — Encounter: Payer: Self-pay | Admitting: Family Medicine

## 2022-03-27 VITALS — BP 124/83 | HR 88 | Temp 98.1°F | Ht 64.0 in | Wt 153.0 lb

## 2022-03-27 DIAGNOSIS — T148XXA Other injury of unspecified body region, initial encounter: Secondary | ICD-10-CM

## 2022-03-27 DIAGNOSIS — S0990XA Unspecified injury of head, initial encounter: Secondary | ICD-10-CM

## 2022-03-27 NOTE — Progress Notes (Signed)
Subjective:  Patient ID: Ashley Holland, female    DOB: [birth year], 81 y.o.   MRN: [id number]  Patient Care Team: [name], DO as PCP - General (Family Medicine)   Chief Complaint:  Injury   HPI: Ashley Holland is a 81 y.o. female presenting on 03/27/2022 for Injury   Pt presents today for revaluation of a knot to her forehead after falling on 03/12/2022. She was seen in the ED after fall, CT head, face, neck unremarkable. She has continued ecchymosis to her cheeks and anterior neck. She reports the knot on her forehead is not getting better. Tender to touch and fluctuant. Not draining or erythematous.      Relevant past medical, surgical, family, and social history reviewed and updated as indicated.  Allergies and medications reviewed and updated. Data reviewed: Chart in Epic.   Past Medical History:  Diagnosis Date   Arthritis    Per medical history form dated 11/04/09.past history DJD back   Enlarged thyroid    "was told had nodules" - no swallowing problems   Female bladder prolapse    GERD (gastroesophageal reflux disease) 05/19/2013   Malignant neoplasm of breast (female), unspecified site    right breast cancer. surgery, radiation, oral meds- Arimidex.   Nontoxic multinodular goiter 11/10/2015   Other and unspecified hyperlipidemia    Rectal prolapse    stool softeners helpful.   Unspecified essential hypertension     Past Surgical History:  Procedure Laterality Date   ABDOMINAL HYSTERECTOMY     BREAST LUMPECTOMY  2010   Right   CHOLECYSTECTOMY     COLONOSCOPY     EUS  04/25/2011   Procedure: UPPER ENDOSCOPIC ULTRASOUND (EUS) RADIAL;  Surgeon: Landry Dyke, MD;  Location: [facility];  Service: Endoscopy;  Laterality: N/A;   TOTAL HIP ARTHROPLASTY Right 07/26/2014   Procedure: RIGHT TOTAL HIP ARTHROPLASTY ANTERIOR APPROACH;  Surgeon: Gaynelle Arabian, MD;  Location: [facility];  Service: Orthopedics;  Laterality: Right;   TOTAL HIP ARTHROPLASTY Left  10/12/2020   Procedure: TOTAL HIP ARTHROPLASTY ANTERIOR APPROACH;  Surgeon: Gaynelle Arabian, MD;  Location: [facility];  Service: Orthopedics;  Laterality: Left;   UPPER GASTROINTESTINAL ENDOSCOPY  Sept 2012    Social History   Socioeconomic History   Marital status: Married    Spouse name: John   Number of children: 2   Years of education: Not on file   Highest education level: Not on file  Occupational History   Occupation: Retired    Comment: Control and instrumentation engineer  Tobacco Use   Smoking status: Never   Smokeless tobacco: Never  Vaping Use   Vaping Use: Never used  Substance and Sexual Activity   Alcohol use: No   Drug use: No   Sexual activity: Not Currently  Other Topics Concern   Not on file  Social History Narrative   One level home with her husband who has dementia -she is his caregiver   Social Determinants of Health   Financial Resource Strain: Low Risk  (03/06/2021)   Overall Financial Resource Strain (CARDIA)    Difficulty of Paying Living Expenses: Not hard at all  Food Insecurity: No Food Insecurity (03/06/2021)   Hunger Vital Sign    Worried About Running Out of Food in the Last Year: Never true    Ran Out of Food in the Last Year: Never true  Transportation Needs: No Transportation Needs (03/06/2021)   PRAPARE - Transportation    Lack of Transportation (  Medical): No    Lack of Transportation (Non-Medical): No  Physical Activity: Insufficiently Active (11/22/2021)   Exercise Vital Sign    Days of Exercise per Week: 6 days    Minutes of Exercise per Session: 10 min  Stress: Stress Concern Present (11/22/2021)   [place]    Feeling of Stress : To some extent  Social Connections: Socially Integrated (03/06/2021)   Social Connection and Isolation Panel [NHANES]    Frequency of Communication with Friends and Family: More than three times a week    Frequency of Social Gatherings with Friends and  Family: More than three times a week    Attends Religious Services: More than 4 times per year    Active Member of [employer] or Organizations: Yes    Attends Music therapist: More than 4 times per year    Marital Status: Married  Human resources officer Violence: Not At Risk (03/06/2021)   Humiliation, Afraid, Rape, and Kick questionnaire    Fear of Current or Ex-Partner: No    Emotionally Abused: No    Physically Abused: No    Sexually Abused: No    Outpatient Encounter Medications as of 03/27/2022  Medication Sig   amLODipine (NORVASC) 5 MG tablet Take 1 tablet (5 mg total) by mouth daily.   ARTIFICIAL TEAR OP Place 1 drop into both eyes daily as needed (dry eyes).   diphenhydrAMINE (BENADRYL) 25 MG tablet Take 25 mg by mouth daily as needed for itching.   hydrochlorothiazide (MICROZIDE) 12.5 MG capsule Take 1 capsule (12.5 mg total) by mouth daily.   No facility-administered encounter medications on file as of 03/27/2022.    No Known Allergies  Review of Systems  Constitutional:  Negative for activity change, appetite change, chills, diaphoresis, fatigue and fever.  HENT: Negative.    Eyes: Negative.  Negative for photophobia and visual disturbance.  Respiratory:  Negative for cough, chest tightness and shortness of breath.   Cardiovascular:  Negative for chest pain, palpitations and leg swelling.  Gastrointestinal:  Negative for abdominal pain, blood in stool, constipation, diarrhea, nausea and vomiting.  Endocrine: Negative.   Genitourinary:  Negative for decreased urine volume, difficulty urinating, dysuria, frequency and urgency.  Musculoskeletal:  Negative for arthralgias and myalgias.  Skin:  Positive for color change and wound. Negative for pallor and rash.  Allergic/Immunologic: Negative.   Neurological:  Negative for dizziness, tremors, seizures, syncope, speech difficulty, weakness, light-headedness, numbness and headaches.  Hematological: Negative.    Psychiatric/Behavioral:  Negative for confusion, hallucinations, sleep disturbance and suicidal ideas.   All other systems reviewed and are negative.       Objective:  BP 124/83   Pulse 88   Temp 98.1 F (36.7 C)   Ht _0  (1.626 m)   Wt 153 lb (69.4 kg)   SpO2 96%   BMI 26.26 kg/m    Wt Readings from Last 3 Encounters:  03/27/22 153 lb (69.4 kg)  03/12/22 150 lb (68 kg)  02/13/22 150 lb 9.6 oz (68.3 kg)    Physical Exam Vitals and nursing note reviewed.  Constitutional:      General: She is not in acute distress.    Appearance: Normal appearance. She is not ill-appearing, toxic-appearing or diaphoretic.  HENT:     Head: Normocephalic and atraumatic.     Mouth/Throat:     Mouth: Mucous membranes are moist.  Eyes:     Conjunctiva/sclera: Conjunctivae normal.  Pupils: Pupils are equal, round, and reactive to light.  Cardiovascular:     Rate and Rhythm: Normal rate and regular rhythm.     Heart sounds: Normal heart sounds.  Musculoskeletal:     Cervical back: Normal range of motion and neck supple.  Skin:    General: Skin is warm and dry.     Capillary Refill: Capillary refill takes less than 2 seconds.     Findings: Ecchymosis (bilateral cheeks and anterior neck) present.     Comments: Approximate 3 cm hematoma to left forehead. No increased warmth or drainage. Fluctuant and slightly tender.  Neurological:     General: No focal deficit present.     Mental Status: She is alert and oriented to person, place, and time.     Cranial Nerves: No cranial nerve deficit.     Motor: No weakness.     Gait: Gait normal.  Psychiatric:        Mood and Affect: Mood normal.        Behavior: Behavior normal.        Thought Content: Thought content normal.        Judgment: Judgment normal.     Results for orders placed or performed in visit on 02/13/22  Lipid Panel  Result Value Ref Range   Cholesterol, Total 221 (H) 100 - 199 mg/dL   Triglycerides 72 0 - 149 mg/dL    HDL 81 >39 mg/dL   VLDL Cholesterol Cal 13 5 - 40 mg/dL   LDL Chol Calc (NIH) 127 (H) 0 - 99 mg/dL   Chol/HDL Ratio 2.7 0.0 - 4.4 ratio  CMP14+EGFR  Result Value Ref Range   Glucose 80 70 - 99 mg/dL   BUN 11 8 - 27 mg/dL   Creatinine, Ser 0.76 0.57 - 1.00 mg/dL   eGFR 78 >59 mL/min/1.73   BUN/Creatinine Ratio 14 12 - 28   Sodium 143 134 - 144 mmol/L   Potassium 4.1 3.5 - 5.2 mmol/L   Chloride 103 96 - 106 mmol/L   CO2 25 20 - 29 mmol/L   Calcium 10.3 8.7 - 10.3 mg/dL   Total Protein 7.1 6.0 - 8.5 g/dL   Albumin 4.6 3.7 - 4.7 g/dL   Globulin, Total 2.5 1.5 - 4.5 g/dL   Albumin/Globulin Ratio 1.8 1.2 - 2.2   Bilirubin Total 0.5 0.0 - 1.2 mg/dL   Alkaline Phosphatase 111 44 - 121 IU/L   AST 27 0 - 40 IU/L   ALT 11 0 - 32 IU/L  TSH  Result Value Ref Range   TSH 0.809 0.450 - 4.500 uIU/mL  CBC  Result Value Ref Range   WBC 8.0 3.4 - 10.8 x10E3/uL   RBC 4.76 3.77 - 5.28 x10E6/uL   Hemoglobin 14.7 11.1 - 15.9 g/dL   Hematocrit 41.9 34.0 - 46.6 %   MCV 88 79 - 97 fL   MCH 30.9 26.6 - 33.0 pg   MCHC 35.1 31.5 - 35.7 g/dL   RDW 13.1 11.7 - 15.4 %   Platelets 327 150 - 450 x10E3/uL     I & D  Date/Time: 03/27/2022 10:24 AM  Performed by: Baruch Gouty, FNP Authorized by: Baruch Gouty, FNP   Consent:    Consent obtained:  Verbal   Consent given by:  Patient   Risks, benefits, and alternatives were discussed: yes     Risks discussed:  Bleeding, incomplete drainage, pain, damage to other organs and infection   Alternatives discussed:  No treatment, delayed treatment, alternative treatment, observation and referral Universal protocol:    Procedure explained and questions answered to patient or proxy's satisfaction: yes     Relevant documents present and verified: yes     Immediately prior to procedure a time out was called: yes     Patient identity confirmed:  Verbally with patient Location:    Type:  Hematoma   Location:  Head   Head/neck location: left  forehead. Pre-procedure details:    Skin preparation:  Alcohol Sedation:    Sedation type:  None Anesthesia:    Anesthesia method:  Topical application   Topical anesthesia: spray lidocaine. Procedure type:    Complexity:  Simple Procedure details:    Needle aspiration: no     Incision types:  Single straight   Scalpel blade:  11   Wound management:  Probed and deloculated   Drainage:  Bloody (clots)   Drainage amount:  Moderate   Wound treatment:  Wound left open   Packing materials:  None Post-procedure details:    Procedure completion:  Tolerated well, no immediate complications    Pertinent labs & imaging results that were available during my care of the patient were reviewed by me and considered in my medical decision making.  Assessment & Plan:  Xochilt was seen today for injury.  Diagnoses and all orders for this visit:  Hematoma Hematoma evacuated in office. Pt tolerated very well. Symptomatic care discussed in detail. Report new, worsening, or persistent symptoms.  -     I & D     Continue all other maintenance medications.  Follow up plan: Return if symptoms worsen or fail to improve.   Continue healthy lifestyle choices, including diet (rich in fruits, vegetables, and lean proteins, and low in salt and simple carbohydrates) and exercise (at least 30 minutes of moderate physical activity daily).  Educational handout given for hematoma   The above assessment and management plan was discussed with the patient. The patient verbalized understanding of and has agreed to the management plan. Patient is aware to call the clinic if they develop any new symptoms or if symptoms persist or worsen. Patient is aware when to return to the clinic for a follow-up visit. Patient educated on when it is appropriate to go to the emergency department.   Monia Pouch, FNP-C [facility] [phone]

## 2022-04-19 ENCOUNTER — Encounter (HOSPITAL_BASED_OUTPATIENT_CLINIC_OR_DEPARTMENT_OTHER): Payer: Self-pay | Admitting: Emergency Medicine

## 2022-04-19 ENCOUNTER — Emergency Department (HOSPITAL_BASED_OUTPATIENT_CLINIC_OR_DEPARTMENT_OTHER)
Admission: EM | Admit: 2022-04-19 | Discharge: 2022-04-19 | Disposition: A | Discharge status: Home / Self Care | Payer: Medicare Other | Attending: Emergency Medicine | Admitting: Emergency Medicine

## 2022-04-19 ENCOUNTER — Emergency Department (HOSPITAL_BASED_OUTPATIENT_CLINIC_OR_DEPARTMENT_OTHER): Payer: Medicare Other

## 2022-04-19 DIAGNOSIS — S0990XA Unspecified injury of head, initial encounter: Secondary | ICD-10-CM | POA: Diagnosis present

## 2022-04-19 DIAGNOSIS — Z79899 Other long term (current) drug therapy: Secondary | ICD-10-CM | POA: Diagnosis not present

## 2022-04-19 DIAGNOSIS — S0101XA Laceration without foreign body of scalp, initial encounter: Secondary | ICD-10-CM | POA: Diagnosis not present

## 2022-04-19 DIAGNOSIS — Z043 Encounter for examination and observation following other accident: Secondary | ICD-10-CM | POA: Diagnosis not present

## 2022-04-19 DIAGNOSIS — I10 Essential (primary) hypertension: Secondary | ICD-10-CM | POA: Insufficient documentation

## 2022-04-19 DIAGNOSIS — Y92009 Unspecified place in unspecified non-institutional (private) residence as the place of occurrence of the external cause: Secondary | ICD-10-CM | POA: Diagnosis not present

## 2022-04-19 DIAGNOSIS — Z853 Personal history of malignant neoplasm of breast: Secondary | ICD-10-CM | POA: Diagnosis not present

## 2022-04-19 DIAGNOSIS — W01190A Fall on same level from slipping, tripping and stumbling with subsequent striking against furniture, initial encounter: Secondary | ICD-10-CM | POA: Insufficient documentation

## 2022-04-19 MED ORDER — LIDOCAINE-EPINEPHRINE (PF) 2 %-1:200000 IJ SOLN
10.0000 mL | Freq: Once | INTRAMUSCULAR | Status: DC
  Filled 2022-04-19: qty 20

## 2022-04-19 NOTE — ED Triage Notes (Signed)
Pt stood up and lost balance fell backward and hit back of head on coffee table. Denies LOC and not on blood thinners. Laceration to back of head bleeding controlled.

## 2022-04-19 NOTE — ED Provider Notes (Signed)
[facility] Provider Note: Georgena Spurling, MD, FACEP  CSN: [id number] MRN: [id number] ARRIVAL: 04/19/22 at [place]: [employer]  Fall   HISTORY OF PRESENT ILLNESS  04/19/22 11:20 PM Ashley Holland is a 81 y.o. female who lost her balance and fell backwards and hit the back of her head on a coffee table just prior to arrival.  She did not lose consciousness and she is not on anticoagulation.  There is a laceration to the back of her head.  Bleeding is controlled.  The wound has been cleaned by nursing staff.  Her tetanus is up-to-date.  She rates associated pain as a 1 out of 10.   Past Medical History:  Diagnosis Date   Arthritis    Per medical history form dated 11/04/09.past history DJD back   Enlarged thyroid    "was told had nodules" - no swallowing problems   Female bladder prolapse    GERD (gastroesophageal reflux disease) 05/19/2013   Malignant neoplasm of breast (female), unspecified site    right breast cancer. surgery, radiation, oral meds- Arimidex.   Nontoxic multinodular goiter 11/10/2015   Other and unspecified hyperlipidemia    Rectal prolapse    stool softeners helpful.   Unspecified essential hypertension     Past Surgical History:  Procedure Laterality Date   ABDOMINAL HYSTERECTOMY     BREAST LUMPECTOMY  2010   Right   CHOLECYSTECTOMY     COLONOSCOPY     EUS  04/25/2011   Procedure: UPPER ENDOSCOPIC ULTRASOUND (EUS) RADIAL;  Surgeon: Landry Dyke, MD;  Location: [facility];  Service: Endoscopy;  Laterality: N/A;   TOTAL HIP ARTHROPLASTY Right 07/26/2014   Procedure: RIGHT TOTAL HIP ARTHROPLASTY ANTERIOR APPROACH;  Surgeon: Gaynelle Arabian, MD;  Location: [facility];  Service: Orthopedics;  Laterality: Right;   TOTAL HIP ARTHROPLASTY Left 10/12/2020   Procedure: TOTAL HIP ARTHROPLASTY ANTERIOR APPROACH;  Surgeon: Gaynelle Arabian, MD;  Location: [facility];  Service: Orthopedics;  Laterality: Left;   UPPER GASTROINTESTINAL ENDOSCOPY   Sept 2012    Family History  Problem Relation Age of Onset   Coronary artery disease Mother 86        CABG age 49   Hyperlipidemia Mother    Stroke Mother    Alcohol abuse Father    Hypertension Father    Hypertension Sister    Hypertension Brother    Arthritis Brother    Anesthesia problems Neg Hx    Hypotension Neg Hx    Malignant hyperthermia Neg Hx    Pseudochol deficiency Neg Hx     Social History   Tobacco Use   Smoking status: Never   Smokeless tobacco: Never  Vaping Use   Vaping Use: Never used  Substance Use Topics   Alcohol use: No   Drug use: No    Prior to Admission medications   Medication Sig Start Date End Date Taking? Authorizing Provider  amLODipine (NORVASC) 5 MG tablet Take 1 tablet (5 mg total) by mouth daily. 02/13/22   [name], DO  ARTIFICIAL TEAR OP Place 1 drop into both eyes daily as needed (dry eyes).    [provider]  diphenhydrAMINE (BENADRYL) 25 MG tablet Take 25 mg by mouth daily as needed for itching.    [provider]  hydrochlorothiazide (MICROZIDE) 12.5 MG capsule Take 1 capsule (12.5 mg total) by mouth daily. 02/13/22   [name], DO    Allergies Patient has no known allergies.  REVIEW OF SYSTEMS  Negative except as noted here or in the History of Present Illness.   PHYSICAL EXAMINATION  Initial Vital Signs Blood pressure (!) 156/78, pulse 82, temperature 98 F (36.7 C), resp. rate 18, weight 69.4 kg, SpO2 96 %.  Examination General: Well-developed, well-nourished female in no acute distress; appearance consistent with age of record HENT: normocephalic; laceration to occiput Eyes: Normal appearance Neck: supple; nontender Heart: regular rate and rhythm Lungs: clear to auscultation bilaterally Abdomen: soft; nondistended; nontender; bowel sounds present Extremities: No deformity; full range of motion; pulses normal Neurologic: Awake, alert and oriented; motor function intact in  all extremities and symmetric; no facial droop Skin: Warm and dry Psychiatric: Normal mood and affect   RESULTS  Summary of this visit's results, reviewed and interpreted by myself:   EKG Interpretation  Date/Time:    Ventricular Rate:    PR Interval:    QRS Duration:   QT Interval:    QTC Calculation:   R Axis:     Text Interpretation:         Laboratory Studies: No results found for this or any previous visit (from the past 24 hour(s)). Imaging Studies: CT Head Wo Contrast  Result Date: 04/19/2022 CLINICAL DATA:  Fall EXAM: CT HEAD WITHOUT CONTRAST TECHNIQUE: Contiguous axial images were obtained from the base of the skull through the vertex without intravenous contrast. RADIATION DOSE REDUCTION: This exam was performed according to the departmental dose-optimization program which includes automated exposure control, adjustment of the mA and/or kV according to patient size and/or use of iterative reconstruction technique. COMPARISON:  03/12/2022 FINDINGS: Brain: There is periventricular hypoattenuation compatible with chronic microvascular disease. No acute hemorrhage, mass or extra-axial collection. Vascular: Mild ICA atherosclerosis Skull: Normal skull.  Right parietal scalp trichilemmal cyst. Sinuses/Orbits: Sinuses and mastoids are clear. Nasopharynx and middle ear cavities are clear. Normal orbits. Other: None IMPRESSION: 1. No acute intracranial abnormality. 2. Chronic microvascular disease. Electronically Signed   By: Ulyses Jarred M.D.   On: 04/19/2022 18:57    ED COURSE and MDM  Nursing notes, initial and subsequent vitals signs, including pulse oximetry, reviewed and interpreted by myself.  Vitals:   04/19/22 1814 04/19/22 2152 04/19/22 2200 04/19/22 2300  BP:  (!) 149/82 (!) 140/82 (!) 156/78  Pulse:  66 63 82  Resp:  '18 18 18  '$ Temp:      SpO2:  94% 99% 96%  Weight: 69.4 kg      Medications  lidocaine-EPINEPHrine (XYLOCAINE W/EPI) 2 %-1:200000 (PF) injection 10  mL (has no administration in time range)   No evidence of significant injury on head CT.  Patient has been observed in the ED about 6 hours with no change in mental status.   PROCEDURES  Procedures LACERATION REPAIR Performed by: Karen Chafe Lissy Deuser Authorized by: Karen Chafe Kehinde Bowdish Consent: Verbal consent obtained. Risks and benefits: risks, benefits and alternatives were discussed Consent given by: patient Patient identity confirmed: provided demographic data Prepped and Draped in normal sterile fashion Wound explored  Laceration Location: Occiput  Laceration Length: 1 cm  No Foreign Bodies seen or palpated  Anesthesia: None (patient preference)  Irrigation method: syringe Amount of cleaning: standard  Skin closure: Staples  Number of staples: 2  Patient tolerance: Patient tolerated the procedure well with no immediate complications.   ED DIAGNOSES     ICD-10-CM   1. Fall at home, initial encounter  W19.XXXA    Y92.009     2. Scalp laceration, initial encounter  S01.01XA          Frederic Tones, Jenny Reichmann, MD 04/19/22 2333

## 2022-04-26 ENCOUNTER — Encounter: Payer: Self-pay | Admitting: Family Medicine

## 2022-04-26 ENCOUNTER — Ambulatory Visit (INDEPENDENT_AMBULATORY_CARE_PROVIDER_SITE_OTHER): Payer: Medicare Other | Admitting: Family Medicine

## 2022-04-26 VITALS — BP 144/87 | HR 89 | Temp 97.1°F | Ht 64.0 in | Wt 149.4 lb

## 2022-04-26 DIAGNOSIS — S0101XA Laceration without foreign body of scalp, initial encounter: Secondary | ICD-10-CM

## 2022-04-26 DIAGNOSIS — W19XXXA Unspecified fall, initial encounter: Secondary | ICD-10-CM

## 2022-04-28 DIAGNOSIS — N390 Urinary tract infection, site not specified: Secondary | ICD-10-CM | POA: Diagnosis not present

## 2022-04-28 DIAGNOSIS — N3001 Acute cystitis with hematuria: Secondary | ICD-10-CM | POA: Diagnosis not present

## 2022-05-01 ENCOUNTER — Encounter: Payer: Self-pay | Admitting: Family Medicine

## 2022-05-01 ENCOUNTER — Telehealth: Payer: Self-pay | Admitting: Family Medicine

## 2022-05-01 NOTE — Telephone Encounter (Signed)
Left message for patient to call back and schedule Medicare Annual Wellness Visit (AWV) to be completed by video or phone.   Last AWV: 03/06/2021   Please schedule at anytime with Lynn     45 minute appointment  Any questions, please contact me at [phone]   Thank you,   [facility] Clinical Support for [place] [facility]. [facility] Are[facility] ??[phone] or ??[phone]

## 2022-05-01 NOTE — Progress Notes (Signed)
Chief Complaint  Patient presents with   Suture / Staple Removal    HPI  Patient presents today for removal of staples placed on December 28.  The patient had fallen backwards.  She went to the emergency department and 2 staples were placed on the occipital scalp.  Healing has been routine no signs of infection.  Patient denies pain.  PMH: Smoking status noted ROS: Per HPI  Objective: BP (!) 144/87   Pulse 89   Temp (!) 97.1 F (36.2 C)   Ht '5\' 4"'$  (1.626 m)   Wt 149 lb 6.4 oz (67.8 kg)   SpO2 95%   BMI 25.64 kg/m  Gen: NAD, alert, cooperative with exam HEENT: NCAT, EOMI, PERRL Skin: There is a clean healing laceration with 2 staples in place posterior scalp.  2 scalp laceration staples were removed without difficulty wound intact Neuro: Alert and oriented, No gross deficits  Assessment and plan:  1. Scalp laceration, initial encounter     Staples removed. Wound care removed  Follow up as needed.  Claretta Fraise, MD

## 2022-06-20 ENCOUNTER — Encounter: Payer: Self-pay | Admitting: Family Medicine

## 2022-06-20 ENCOUNTER — Ambulatory Visit (INDEPENDENT_AMBULATORY_CARE_PROVIDER_SITE_OTHER): Payer: Medicare Other | Admitting: Family Medicine

## 2022-06-20 VITALS — BP 142/84 | HR 97 | Temp 98.4°F | Ht 64.0 in | Wt 154.0 lb

## 2022-06-20 DIAGNOSIS — Z636 Dependent relative needing care at home: Secondary | ICD-10-CM

## 2022-06-20 DIAGNOSIS — H269 Unspecified cataract: Secondary | ICD-10-CM

## 2022-06-20 DIAGNOSIS — I10 Essential (primary) hypertension: Secondary | ICD-10-CM

## 2022-06-20 NOTE — Progress Notes (Signed)
Subjective: [id number] PCP: Ashley Norlander, DO [id number] Ashley Holland is a 82 y.o. female presenting to clinic today for:  1.  Caregiver stress Patient reports that her husband has been doing better on his increased mood medicine.  He still has his moments but she is able to cope with this and has not really had any issues lately.  Her son was in town recently and helped a lot with cooking and this really was a nice break  2.  Hypertension Compliant with Norvasc, HCTZ.  No reports of chest pain, shortness of breath or dizziness.  3.  Cataracts She was recently told at her eye doctor [facility] that she had cataracts.  She would like referral to ophthalmology to address this.  She has noticed some increasing cloudy vision and she thinks it is time to perhaps take care of it.  ROS: Per HPI  No Known Allergies Past Medical History:  Diagnosis Date   Arthritis    Per medical history form dated 11/04/09.past history DJD back   Enlarged thyroid    "was told had nodules" - no swallowing problems   Female bladder prolapse    GERD (gastroesophageal reflux disease) 05/19/2013   Malignant neoplasm of breast (female), unspecified site    right breast cancer. surgery, radiation, oral meds- Arimidex.   Nontoxic multinodular goiter 11/10/2015   Other and unspecified hyperlipidemia    Rectal prolapse    stool softeners helpful.   Unspecified essential hypertension     Current Outpatient Medications:    amLODipine (NORVASC) 5 MG tablet, Take 1 tablet (5 mg total) by mouth daily., Disp: 90 tablet, Rfl: 3   ARTIFICIAL TEAR OP, Place 1 drop into both eyes daily as needed (dry eyes)., Disp: , Rfl:    aspirin 81 MG chewable tablet, Chew by mouth daily., Disp: , Rfl:    diphenhydrAMINE (BENADRYL) 25 MG tablet, Take 25 mg by mouth daily as needed for itching., Disp: , Rfl:    hydrochlorothiazide (MICROZIDE) 12.5 MG capsule, Take 1 capsule (12.5 mg total) by mouth daily., Disp: 90 capsule, Rfl:  3 Social History   Socioeconomic History   Marital status: Married    Spouse name: John   Number of children: 2   Years of education: Not on file   Highest education level: Not on file  Occupational History   Occupation: Retired    Comment: Control and instrumentation engineer  Tobacco Use   Smoking status: Never   Smokeless tobacco: Never  Vaping Use   Vaping Use: Never used  Substance and Sexual Activity   Alcohol use: No   Drug use: No   Sexual activity: Not Currently  Other Topics Concern   Not on file  Social History Narrative   One level home with her husband who has dementia -she is his caregiver   Social Determinants of Health   Financial Resource Strain: Low Risk  (03/06/2021)   Overall Financial Resource Strain (CARDIA)    Difficulty of Paying Living Expenses: Not hard at all  Food Insecurity: No Food Insecurity (03/06/2021)   Hunger Vital Sign    Worried About Running Out of Food in the Last Year: Never true    [place] in the Last Year: Never true  Transportation Needs: No Transportation Needs (03/06/2021)   PRAPARE - Hydrologist (Medical): No    Lack of Transportation (Non-Medical): No  Physical Activity: Insufficiently Active (11/22/2021)   Exercise Vital Sign  Days of Exercise per Week: 6 days    Minutes of Exercise per Session: 10 min  Stress: Stress Concern Present (11/22/2021)   Weed    Feeling of Stress : To some extent  Social Connections: Socially Integrated (03/06/2021)   Social Connection and Isolation Panel [NHANES]    Frequency of Communication with Friends and Family: More than three times a week    Frequency of Social Gatherings with Friends and Family: More than three times a week    Attends Religious Services: More than 4 times per year    Active Member of [employer] or Organizations: Yes    Attends Music therapist: More than 4 times per  year    Marital Status: Married  Human resources officer Violence: Not At Risk (03/06/2021)   Humiliation, Afraid, Rape, and Kick questionnaire    Fear of Current or Ex-Partner: No    Emotionally Abused: No    Physically Abused: No    Sexually Abused: No   Family History  Problem Relation Age of Onset   Coronary artery disease Mother 55        CABG age 54   Hyperlipidemia Mother    Stroke Mother    Alcohol abuse Father    Hypertension Father    Hypertension Sister    Hypertension Brother    Arthritis Brother    Anesthesia problems Neg Hx    Hypotension Neg Hx    Malignant hyperthermia Neg Hx    Pseudochol deficiency Neg Hx     Objective: Office vital signs reviewed. BP (!) 142/84   Pulse 97   Temp 98.4 F (36.9 C)   Ht '5\' 4"'$  (1.626 m)   Wt 154 lb (69.9 kg)   SpO2 98%   BMI 26.43 kg/m   Physical Examination:  General: Awake, alert, well nourished, No acute distress HEENT:  sclera white, MMM Cardio: regular rate and rhythm, S1S2 heard, no murmurs appreciated Pulm: clear to auscultation bilaterally, no wheezes, rhonchi or rales; normal work of breathing on room air Psych: Mood stable, speech normal, affect appropriate  Assessment/ Plan: 82 y.o. female   Primary hypertension  Cataract of both eyes, unspecified cataract type - Plan: Ambulatory referral to Ophthalmology  Caregiver stress  Blood pressure is borderline but controlled for age.  No changes  Referral to ophthalmology for cataracts.  Doing well from a caregiver standpoint.  No additional interventions needed at this time  No orders of the defined types were placed in this encounter.  No orders of the defined types were placed in this encounter.    Ashley Norlander, DO [place] [phone]

## 2022-06-25 DIAGNOSIS — N993 Prolapse of vaginal vault after hysterectomy: Secondary | ICD-10-CM | POA: Diagnosis not present

## 2022-06-25 DIAGNOSIS — Z01818 Encounter for other preprocedural examination: Secondary | ICD-10-CM | POA: Diagnosis not present

## 2022-07-03 ENCOUNTER — Telehealth: Payer: Self-pay | Admitting: Family Medicine

## 2022-07-03 DIAGNOSIS — Z7982 Long term (current) use of aspirin: Secondary | ICD-10-CM | POA: Diagnosis not present

## 2022-07-03 DIAGNOSIS — K629 Disease of anus and rectum, unspecified: Secondary | ICD-10-CM | POA: Diagnosis not present

## 2022-07-03 DIAGNOSIS — N8111 Cystocele, midline: Secondary | ICD-10-CM | POA: Diagnosis not present

## 2022-07-03 DIAGNOSIS — N8110 Cystocele, unspecified: Secondary | ICD-10-CM | POA: Diagnosis not present

## 2022-07-03 DIAGNOSIS — N993 Prolapse of vaginal vault after hysterectomy: Secondary | ICD-10-CM | POA: Diagnosis not present

## 2022-07-03 DIAGNOSIS — K626 Ulcer of anus and rectum: Secondary | ICD-10-CM | POA: Diagnosis not present

## 2022-07-03 DIAGNOSIS — Z79899 Other long term (current) drug therapy: Secondary | ICD-10-CM | POA: Diagnosis not present

## 2022-07-03 DIAGNOSIS — K621 Rectal polyp: Secondary | ICD-10-CM | POA: Diagnosis not present

## 2022-07-03 DIAGNOSIS — N816 Rectocele: Secondary | ICD-10-CM | POA: Diagnosis not present

## 2022-07-03 DIAGNOSIS — I10 Essential (primary) hypertension: Secondary | ICD-10-CM | POA: Diagnosis not present

## 2022-07-03 DIAGNOSIS — N813 Complete uterovaginal prolapse: Secondary | ICD-10-CM | POA: Diagnosis not present

## 2022-07-03 NOTE — Telephone Encounter (Signed)
Contacted [facility] to schedule their annual wellness visit. Patient declined to schedule AWV at this time.  Per cancellation note on 2.20.24 patient does not fill she needs this appt.  Thank you,  Colletta Maryland,  [facility] Direct Dial ??[id number]

## 2022-07-04 DIAGNOSIS — I493 Ventricular premature depolarization: Secondary | ICD-10-CM | POA: Diagnosis not present

## 2022-07-23 DIAGNOSIS — K626 Ulcer of anus and rectum: Secondary | ICD-10-CM | POA: Diagnosis not present

## 2022-07-23 DIAGNOSIS — N993 Prolapse of vaginal vault after hysterectomy: Secondary | ICD-10-CM | POA: Diagnosis not present

## 2022-08-13 DIAGNOSIS — H43813 Vitreous degeneration, bilateral: Secondary | ICD-10-CM | POA: Diagnosis not present

## 2022-08-13 DIAGNOSIS — H25813 Combined forms of age-related cataract, bilateral: Secondary | ICD-10-CM | POA: Diagnosis not present

## 2022-08-31 DIAGNOSIS — N3942 Incontinence without sensory awareness: Secondary | ICD-10-CM | POA: Diagnosis not present

## 2022-08-31 DIAGNOSIS — K5900 Constipation, unspecified: Secondary | ICD-10-CM | POA: Diagnosis not present

## 2022-08-31 DIAGNOSIS — Z9889 Other specified postprocedural states: Secondary | ICD-10-CM | POA: Diagnosis not present

## 2022-08-31 DIAGNOSIS — N993 Prolapse of vaginal vault after hysterectomy: Secondary | ICD-10-CM | POA: Diagnosis not present

## 2022-08-31 DIAGNOSIS — N362 Urethral caruncle: Secondary | ICD-10-CM | POA: Diagnosis not present

## 2022-09-18 DIAGNOSIS — H2511 Age-related nuclear cataract, right eye: Secondary | ICD-10-CM | POA: Diagnosis not present

## 2022-09-19 ENCOUNTER — Ambulatory Visit: Payer: Medicare Other | Admitting: Family Medicine

## 2022-09-26 DIAGNOSIS — H2512 Age-related nuclear cataract, left eye: Secondary | ICD-10-CM | POA: Diagnosis not present

## 2022-10-02 DIAGNOSIS — H2512 Age-related nuclear cataract, left eye: Secondary | ICD-10-CM | POA: Diagnosis not present

## 2022-10-05 ENCOUNTER — Ambulatory Visit (INDEPENDENT_AMBULATORY_CARE_PROVIDER_SITE_OTHER): Payer: Medicare Other | Admitting: Family Medicine

## 2022-10-05 ENCOUNTER — Encounter: Payer: Self-pay | Admitting: Family Medicine

## 2022-10-05 VITALS — BP 146/73 | HR 72 | Temp 98.6°F | Ht 64.0 in | Wt 150.0 lb

## 2022-10-05 DIAGNOSIS — Z9842 Cataract extraction status, left eye: Secondary | ICD-10-CM

## 2022-10-05 DIAGNOSIS — Z9841 Cataract extraction status, right eye: Secondary | ICD-10-CM | POA: Diagnosis not present

## 2022-10-05 DIAGNOSIS — I10 Essential (primary) hypertension: Secondary | ICD-10-CM | POA: Diagnosis not present

## 2022-10-05 DIAGNOSIS — Z634 Disappearance and death of family member: Secondary | ICD-10-CM

## 2022-10-06 DIAGNOSIS — Z9841 Cataract extraction status, right eye: Secondary | ICD-10-CM | POA: Insufficient documentation

## 2022-10-06 NOTE — [place] Notes (Signed)
Subjective: CC: Hypertension PCP: Raliegh Ip, DO [id number] Ashley Holland is a 82 y.o. female presenting to clinic today for:  1.  Hypertension She reports compliance with Norvasc and hydrochlorothiazide.  Her blood pressures typically have been running 130s systolic to 140s systolic.  She has an occasional outlier systolic blood pressure in the 150s and this typically only occurs in the morning near when she takes her blood pressure medicine.  She denies any visual disturbance, chest pain, shortness of breath.  She is really trying to work more on herself now.  Her spouse passed away from complications of dementia recently.  She feels like she is coping with this well.  Her sleep has been good.  She has family that she has been able to connect with and seems to have great support.  Since her last visit she had cataracts removed bilaterally and reports that she is really seeing well now   ROS: Per HPI  No Known Allergies Past Medical History:  Diagnosis Date   Arthritis    Per medical history form dated 11/04/09.past history DJD back   Enlarged thyroid    "was told had nodules" - no swallowing problems   Female bladder prolapse    GERD (gastroesophageal reflux disease) 05/19/2013   Malignant neoplasm of breast (female), unspecified site    right breast cancer. surgery, radiation, oral meds- Arimidex.   Nontoxic multinodular goiter 11/10/2015   Other and unspecified hyperlipidemia    Rectal prolapse    stool softeners helpful.   Unspecified essential hypertension     Current Outpatient Medications:    amLODipine (NORVASC) 5 MG tablet, Take 1 tablet (5 mg total) by mouth daily., Disp: 90 tablet, Rfl: 3   ARTIFICIAL TEAR OP, Place 1 drop into both eyes daily as needed (dry eyes)., Disp: , Rfl:    diphenhydrAMINE (BENADRYL) 25 MG tablet, Take 25 mg by mouth daily as needed for itching., Disp: , Rfl:    hydrochlorothiazide (MICROZIDE) 12.5 MG capsule, Take 1 capsule (12.5 mg total)  by mouth daily., Disp: 90 capsule, Rfl: 3 Social History   Socioeconomic History   Marital status: Widowed    Spouse name: John   Number of children: 2   Years of education: Not on file   Highest education level: 12th grade  Occupational History   Occupation: Retired    Comment: Futures trader  Tobacco Use   Smoking status: Never   Smokeless tobacco: Never  Vaping Use   Vaping Use: Never used  Substance and Sexual Activity   Alcohol use: No   Drug use: No   Sexual activity: Not Currently  Other Topics Concern   Not on file  Social History Narrative   One level home with her husband who has dementia -she is his caregiver   Social Determinants of Health   Financial Resource Strain: Patient Declined (10/01/2022)   Overall Financial Resource Strain (CARDIA)    Difficulty of Paying Living Expenses: Patient declined  Food Insecurity: No Food Insecurity (10/01/2022)   Hunger Vital Sign    Worried About Running Out of Food in the Last Year: Never true    Ran Out of Food in the Last Year: Never true  Transportation Needs: No Transportation Needs (10/01/2022)   PRAPARE - Administrator, Civil Service (Medical): No    Lack of Transportation (Non-Medical): No  Physical Activity: Insufficiently Active (10/01/2022)   Exercise Vital Sign    Days of Exercise per Week: 6 days  Minutes of Exercise per Session: 20 min  Stress: No Stress Concern Present (10/01/2022)   Ashley [facility] [employer] - Occupational Stress Questionnaire    Feeling of Stress : Not at all  Social Connections: Moderately Integrated (10/01/2022)   Social Connection and Isolation Panel [NHANES]    Frequency of Communication with Friends and Family: More than three times a week    Frequency of Social Gatherings with Friends and Family: Three times a week    Attends Religious Services: More than 4 times per year    Active Member of Clubs or Organizations: Yes    Attends Tax inspector Meetings: Patient declined    Marital Status: Widowed  Intimate Partner Violence: Not At Risk (03/06/2021)   Humiliation, Afraid, Rape, and Kick questionnaire    Fear of Current or Ex-Partner: No    Emotionally Abused: No    Physically Abused: No    Sexually Abused: No   Family History  Problem Relation Age of Onset   Coronary artery disease Mother 72        CABG age 18   Hyperlipidemia Mother    Stroke Mother    Alcohol abuse Father    Hypertension Father    Hypertension Sister    Hypertension Brother    Arthritis Brother    Anesthesia problems Neg Hx    Hypotension Neg Hx    Malignant hyperthermia Neg Hx    Pseudochol deficiency Neg Hx     Objective: Office vital signs reviewed. BP (!) 146/73 Comment: home blood pressure reading  Pulse 72   Temp 98.6 F (37 C)   Ht 5\' 4"  (1.626 m)   Wt 150 lb (68 kg)   SpO2 98%   BMI 25.75 kg/m   Physical Examination:  General: Awake, alert, well nourished, No acute distress HEENT: sclera white MMM Cardio: regular rate and rhythm, S1S2 heard, no murmurs appreciated Pulm: clear to auscultation bilaterally, no wheezes, rhonchi or rales; normal work of breathing on room air Psych: Mood stable, speech normal, affect appropriate.  Very pleasant, interactive  Assessment/ Plan: 82 y.o. female   White coat syndrome with diagnosis of hypertension  S/P bilateral cataract extraction  Death of family member  Blood pressures are controlled at home though remain elevated here in office.  Continue current regimen for now.  I would like her to bring her blood pressure meter and again at next office visit  Doing well after cataract extraction  Seems to be coping with the passing of her husband well.  I encouraged her to contact me if her situation changes for any reason and we can talk about interventions.  No orders of the defined types were placed in this encounter.  No orders of the defined types were placed in this  encounter.    Raliegh Ip, DO [facility] [phone]

## 2022-11-20 DIAGNOSIS — Z1231 Encounter for screening mammogram for malignant neoplasm of breast: Secondary | ICD-10-CM | POA: Diagnosis not present

## 2022-11-23 ENCOUNTER — Telehealth: Payer: Self-pay | Admitting: Family Medicine

## 2022-11-23 ENCOUNTER — Ambulatory Visit: Payer: Medicare Other | Admitting: Family Medicine

## 2022-11-23 ENCOUNTER — Encounter: Payer: Self-pay | Admitting: Family Medicine

## 2022-11-23 VITALS — BP 126/76 | HR 79 | Temp 98.7°F | Ht 64.0 in | Wt 154.0 lb

## 2022-11-23 DIAGNOSIS — J069 Acute upper respiratory infection, unspecified: Secondary | ICD-10-CM

## 2022-11-23 DIAGNOSIS — U071 COVID-19: Secondary | ICD-10-CM

## 2022-11-23 MED ORDER — MOLNUPIRAVIR EUA 200MG CAPSULE: 4.0000 | ORAL_CAPSULE | Freq: Two times a day (BID) | ORAL | 0 refills | Status: AC

## 2022-11-23 NOTE — Telephone Encounter (Signed)
Contacted patient, patient aware.

## 2022-11-23 NOTE — Telephone Encounter (Signed)
Sending to provider to approve Rx.

## 2022-11-23 NOTE — Progress Notes (Signed)
Acute Office Visit  Subjective:  Patient ID: Ashley Holland, female    DOB: 06/10/1940, 82 y.o.   MRN: [id number]  Chief Complaint  Patient presents with   Covid Positive   HPI Patient is in today for URI symptoms.  States that they started yesterday afternoon. Last night she felt she had a "head cold" with facial tenderness, chills, and phlegm production. States she took a home covid test and it was positive. However, it was expired so she would like another test.  States that she slept well last night and feels much better today.  States that she went on a day trip on Wednesday with go cart riding and boat riding and wonders if that made her sick.   ROS As per HPI  Objective:  BP 126/76   Pulse 79   Temp 98.7 F (37.1 C)   Ht 5\' 4"  (1.626 m)   Wt 154 lb (69.9 kg)   SpO2 96%   BMI 26.43 kg/m   Physical Exam Constitutional:      General: She is awake. She is not in acute distress.    Appearance: Normal appearance. She is well-developed and well-groomed. She is not ill-appearing, toxic-appearing or diaphoretic.     Interventions: She is not intubated. HENT:     Head:     Salivary Glands: Right salivary gland is not diffusely enlarged or tender. Left salivary gland is not diffusely enlarged or tender.     Right Ear: No decreased hearing noted. No laceration, drainage, swelling or tenderness. No middle ear effusion. There is no impacted cerumen. No foreign body. No mastoid tenderness. No PE tube. No hemotympanum. Tympanic membrane is not injected, scarred, perforated, erythematous, retracted or bulging.     Left Ear: No decreased hearing noted. No laceration, drainage, swelling or tenderness.  No middle ear effusion. There is no impacted cerumen. No foreign body. No mastoid tenderness. No PE tube. No hemotympanum. Tympanic membrane is injected and erythematous. Tympanic membrane is not scarred, perforated, retracted or bulging.     Nose: No congestion or rhinorrhea.     Right  Sinus: No maxillary sinus tenderness or frontal sinus tenderness.     Left Sinus: No maxillary sinus tenderness or frontal sinus tenderness.     Mouth/Throat:     Lips: Pink.     Mouth: Mucous membranes are moist.     Dentition: Normal dentition.     Tongue: No lesions.     Palate: No mass.     Pharynx: Oropharynx is clear. No pharyngeal swelling, oropharyngeal exudate, posterior oropharyngeal erythema, uvula swelling or postnasal drip.     Tonsils: No tonsillar exudate or tonsillar abscesses.  Eyes:     General: Lids are normal.     Extraocular Movements:     Right eye: Normal extraocular motion.     Left eye: Normal extraocular motion.  Cardiovascular:     Rate and Rhythm: Normal rate and regular rhythm.     Pulses: Normal pulses.          Radial pulses are 2+ on the right side and 2+ on the left side.       Posterior tibial pulses are 2+ on the right side and 2+ on the left side.     Heart sounds: Normal heart sounds. No murmur heard.    No gallop.  Pulmonary:     Effort: Pulmonary effort is normal. No tachypnea, bradypnea, accessory muscle usage, prolonged expiration, respiratory distress or retractions. She is  not intubated.     Breath sounds: Normal breath sounds. No stridor, decreased air movement or transmitted upper airway sounds. No decreased breath sounds, wheezing, rhonchi or rales.  Musculoskeletal:     Cervical back: Full passive range of motion without pain and neck supple.     Right lower leg: No edema.     Left lower leg: No edema.  Lymphadenopathy:     Head:     Right side of head: No submental, submandibular, tonsillar, preauricular or posterior auricular adenopathy.     Left side of head: No submental, submandibular, tonsillar, preauricular or posterior auricular adenopathy.     Cervical:     Right cervical: No superficial or deep cervical adenopathy.    Left cervical: No superficial or deep cervical adenopathy.  Skin:    General: Skin is warm.     Capillary  Refill: Capillary refill takes less than 2 seconds.  Neurological:     General: No focal deficit present.     Mental Status: She is alert, oriented to person, place, and time and easily aroused. Mental status is at baseline.     GCS: GCS eye subscore is 4. GCS verbal subscore is 5. GCS motor subscore is 6.     Motor: No weakness.  Psychiatric:        Attention and Perception: Attention and perception normal.        Mood and Affect: Mood and affect normal.        Speech: Speech normal.        Behavior: Behavior normal. Behavior is cooperative.        Thought Content: Thought content normal. Thought content does not include homicidal or suicidal ideation. Thought content does not include homicidal or suicidal plan.        Cognition and Memory: Cognition and memory normal.        Judgment: Judgment normal.       11/23/2022    9:03 AM 06/20/2022   10:51 AM 04/26/2022   11:09 AM  Depression screen PHQ 2/9  Decreased Interest 0 0 0  Down, Depressed, Hopeless 0 0 0  PHQ - 2 Score 0 0 0  Altered sleeping 0 0   Tired, decreased energy 0 0   Change in appetite 0 0   Feeling bad or failure about yourself  0 0   Trouble concentrating 0 0   Moving slowly or fidgety/restless 0 0   Suicidal thoughts 0 0   PHQ-9 Score 0 0   Difficult doing work/chores Not difficult at all Not difficult at all       11/23/2022    9:04 AM 06/20/2022   10:50 AM 03/27/2022   10:12 AM 02/13/2022    1:10 PM  GAD 7 : Generalized Anxiety Score  Nervous, Anxious, on Edge 0 0 0 1  Control/stop worrying 0 0 0 0  Worry too much - different things 0 0 0 1  Trouble relaxing 0 0 0 0  Restless 0 0 0 0  Easily annoyed or irritable 0 0 0 1  Afraid - awful might happen 0 0 0 1  Total GAD 7 Score 0 0 0 4  Anxiety Difficulty Not difficult at all Not difficult at all  Somewhat difficult   Assessment & Plan:  1. Viral upper respiratory tract infection Labs as below. Will communicate results to patient once available. Will  await results to determine next steps.  Patient declined paxlovid at this time.  - COVID-19, Flu  A+B and RSV  The above assessment and management plan was discussed with the patient. The patient verbalized understanding of and has agreed to the management plan using shared-decision making. Patient is aware to call the clinic if they develop any new symptoms or if symptoms fail to improve or worsen. Patient is aware when to return to the clinic for a follow-up visit. Patient educated on when it is appropriate to go to the emergency department.   Return if symptoms worsen or fail to improve.  Neale Burly, DNP-FNP [facility] [address] [place], [place] [address] [phone]

## 2022-11-27 NOTE — Progress Notes (Signed)
Patient positive for Covid. Molnupiravir sent in Friday. Patient to complete infection prevention precautions such as wearing a mask for 5 days after symptoms resolve and isolating while symptoms continue. Can continue supportive therapies as well: increase hydration, throat lozenges, humidifier, etc.

## 2022-12-11 ENCOUNTER — Encounter: Payer: Self-pay | Admitting: Family Medicine

## 2023-01-21 NOTE — Progress Notes (Unsigned)
Subjective: CC:*** PCP: Ashley Ip, DO ZOX:[id number] Ashley Holland is a 82 y.o. female presenting to clinic today for:  1. ***   ROS: Per HPI  No Known Allergies Past Medical History:  Diagnosis Date   Arthritis    Per medical history form dated 11/04/09.past history DJD back   Enlarged thyroid    "was told had nodules" - no swallowing problems   Female bladder prolapse    GERD (gastroesophageal reflux disease) 05/19/2013   Malignant neoplasm of breast (female), unspecified site    right breast cancer. surgery, radiation, oral meds- Arimidex.   Nontoxic multinodular goiter 11/10/2015   Other and unspecified hyperlipidemia    Rectal prolapse    stool softeners helpful.   Unspecified essential hypertension     Current Outpatient Medications:    amLODipine (NORVASC) 5 MG tablet, Take 1 tablet (5 mg total) by mouth daily., Disp: 90 tablet, Rfl: 3   ARTIFICIAL TEAR OP, Place 1 drop into both eyes daily as needed (dry eyes)., Disp: , Rfl:    Calcium Carb-Cholecalciferol (CALCIUM HIGH POTENCY/VITAMIN D) 600-5 MG-MCG TABS, Take by mouth., Disp: , Rfl:    diphenhydrAMINE (BENADRYL) 25 MG tablet, Take 25 mg by mouth daily as needed for itching., Disp: , Rfl:    Docusate Sodium (DSS) 100 MG CAPS, Take by mouth., Disp: , Rfl:    hydrochlorothiazide (MICROZIDE) 12.5 MG capsule, Take 1 capsule (12.5 mg total) by mouth daily., Disp: 90 capsule, Rfl: 3 Social History   Socioeconomic History   Marital status: Widowed    Spouse name: Ashley Holland   Number of children: 2   Years of education: Not on file   Highest education level: 12th grade  Occupational History   Occupation: Retired    Comment: Futures trader  Tobacco Use   Smoking status: Never   Smokeless tobacco: Never  Vaping Use   Vaping status: Never Used  Substance and Sexual Activity   Alcohol use: No   Drug use: No   Sexual activity: Not Currently  Other Topics Concern   Not on file  Social History Narrative   One  level home with her husband who has dementia -she is his caregiver   Social Determinants of Health   Financial Resource Strain: Patient Declined (10/01/2022)   Overall Financial Resource Strain (CARDIA)    Difficulty of Paying Living Expenses: Patient declined  Food Insecurity: No Food Insecurity (10/01/2022)   Hunger Vital Sign    Worried About Running Out of Food in the Last Year: Never true    Ran Out of Food in the Last Year: Never true  Transportation Needs: No Transportation Needs (10/01/2022)   PRAPARE - Administrator, Civil Service (Medical): No    Lack of Transportation (Non-Medical): No  Physical Activity: Insufficiently Active (10/01/2022)   Exercise Vital Sign    Days of Exercise per Week: 6 days    Minutes of Exercise per Session: 20 min  Stress: No Stress Concern Present (10/01/2022)   [employer] of Occupational Health - Occupational Stress Questionnaire    Feeling of Stress : Not at all  Social Connections: Moderately Integrated (10/01/2022)   Social Connection and Isolation Panel [NHANES]    Frequency of Communication with Friends and Family: More than three times a week    Frequency of Social Gatherings with Friends and Family: Three times a week    Attends Religious Services: More than 4 times per year    Active Member of Clubs or  Organizations: Yes    Attends Banker Meetings: Patient declined    Marital Status: Widowed  Intimate Partner Violence: Unknown (07/25/2021)   Received from [facility], [facility]   HITS    Physically Hurt: Not on file    Insult or Talk Down To: Not on file    Threaten Physical Harm: Not on file    Scream or Curse: Not on file   Family History  Problem Relation Age of Onset   Coronary artery disease Mother 29        CABG age 60   Hyperlipidemia Mother    Stroke Mother    Alcohol abuse Father    Hypertension Father    Hypertension Sister    Hypertension Brother    Arthritis Brother    Anesthesia  problems Neg Hx    Hypotension Neg Hx    Malignant hyperthermia Neg Hx    Pseudochol deficiency Neg Hx     Objective: Office vital signs reviewed. There were no vitals taken for this visit.  Physical Examination:  General: Awake, alert, *** nourished, No acute distress HEENT: Normal    Neck: No masses palpated. No lymphadenopathy    Ears: Tympanic membranes intact, normal light reflex, no erythema, no bulging    Eyes: PERRLA, extraocular membranes intact, sclera ***    Nose: nasal turbinates moist, *** nasal discharge    Throat: moist mucus membranes, no erythema, *** tonsillar exudate.  Airway is patent Cardio: regular rate and rhythm, S1S2 heard, no murmurs appreciated Pulm: clear to auscultation bilaterally, no wheezes, rhonchi or rales; normal work of breathing on room air GI: soft, non-tender, non-distended, bowel sounds present x4, no hepatomegaly, no splenomegaly, no masses GU: external vaginal tissue ***, cervix ***, *** punctate lesions on cervix appreciated, *** discharge from cervical os, *** bleeding, *** cervical motion tenderness, *** abdominal/ adnexal masses Extremities: warm, well perfused, No edema, cyanosis or clubbing; +*** pulses bilaterally MSK: *** gait and *** station Skin: dry; intact; no rashes or lesions Neuro: *** Strength and light touch sensation grossly intact, *** DTRs ***/4  Assessment/ Plan: 82 y.o. female   White coat syndrome with diagnosis of hypertension  Pure hypercholesterolemia  Iron deficiency anemia secondary to inadequate dietary iron intake  ***   Ashley Reigle Hulen Skains, DO [facility] [phone]

## 2023-01-22 ENCOUNTER — Encounter: Payer: Self-pay | Admitting: Family Medicine

## 2023-01-22 ENCOUNTER — Ambulatory Visit (INDEPENDENT_AMBULATORY_CARE_PROVIDER_SITE_OTHER): Payer: Medicare Other | Admitting: Family Medicine

## 2023-01-22 VITALS — BP 142/86 | HR 68 | Temp 98.0°F | Ht 64.0 in | Wt 150.2 lb

## 2023-01-22 DIAGNOSIS — E7800 Pure hypercholesterolemia, unspecified: Secondary | ICD-10-CM | POA: Diagnosis not present

## 2023-01-22 DIAGNOSIS — Z634 Disappearance and death of family member: Secondary | ICD-10-CM | POA: Diagnosis not present

## 2023-01-22 DIAGNOSIS — Z23 Encounter for immunization: Secondary | ICD-10-CM

## 2023-01-22 DIAGNOSIS — I10 Essential (primary) hypertension: Secondary | ICD-10-CM | POA: Diagnosis not present

## 2023-01-22 DIAGNOSIS — D508 Other iron deficiency anemias: Secondary | ICD-10-CM | POA: Diagnosis not present

## 2023-01-22 DIAGNOSIS — M79672 Pain in left foot: Secondary | ICD-10-CM | POA: Diagnosis not present

## 2023-01-22 LAB — LIPID PANEL

## 2023-01-23 DIAGNOSIS — M79672 Pain in left foot: Secondary | ICD-10-CM | POA: Diagnosis not present

## 2023-01-23 DIAGNOSIS — D508 Other iron deficiency anemias: Secondary | ICD-10-CM | POA: Diagnosis not present

## 2023-01-23 DIAGNOSIS — Z634 Disappearance and death of family member: Secondary | ICD-10-CM | POA: Diagnosis not present

## 2023-01-23 DIAGNOSIS — E7800 Pure hypercholesterolemia, unspecified: Secondary | ICD-10-CM | POA: Diagnosis not present

## 2023-01-23 DIAGNOSIS — I10 Essential (primary) hypertension: Secondary | ICD-10-CM | POA: Diagnosis not present

## 2023-01-23 DIAGNOSIS — Z23 Encounter for immunization: Secondary | ICD-10-CM | POA: Diagnosis not present

## 2023-01-23 LAB — LIPID PANEL
Cholesterol, Total: 214 mg/dL — ABNORMAL HIGH (ref 100–199)
HDL: 74 mg/dL (ref >39)
LDL CALC COMMENT:: 2.9 ratio (ref 0.0–4.4)
LDL Chol Calc (NIH): 128 mg/dL — ABNORMAL HIGH (ref 0–99)
Triglycerides: 68 mg/dL (ref 0–149)
VLDL Cholesterol Cal: 12 mg/dL (ref 5–40)

## 2023-01-23 LAB — CMP14+EGFR
ALT: 14 [IU]/L (ref 0–32)
AST: 31 [IU]/L (ref 0–40)
Albumin: 4.3 g/dL (ref 3.7–4.7)
Alkaline Phosphatase: 95 [IU]/L (ref 44–121)
BUN/Creatinine Ratio: 17 (ref 12–28)
BUN: 12 mg/dL (ref 8–27)
Bilirubin Total: 0.4 mg/dL (ref 0.0–1.2)
CO2: 23 mmol/L (ref 20–29)
Calcium: 9.5 mg/dL (ref 8.7–10.3)
Chloride: 103 mmol/L (ref 96–106)
Creatinine, Ser: 0.69 mg/dL (ref 0.57–1.00)
Globulin, Total: 2.3 g/dL (ref 1.5–4.5)
Glucose: 84 mg/dL (ref 70–99)
Potassium: 3.8 mmol/L (ref 3.5–5.2)
Sodium: 138 mmol/L (ref 134–144)
Total Protein: 6.6 g/dL (ref 6.0–8.5)
eGFR: 86 mL/min/{1.73_m2} (ref >59)

## 2023-01-23 LAB — CBC
Hematocrit: 42.1 % (ref 34.0–46.6)
Hemoglobin: 14 g/dL (ref 11.1–15.9)
MCH: 30.2 pg (ref 26.6–33.0)
MCHC: 33.3 g/dL (ref 31.5–35.7)
MCV: 91 fL (ref 79–97)
Platelets: 291 10*3/uL (ref 150–450)
RBC: 4.63 x10E6/uL (ref 3.77–5.28)
RDW: 13.1 % (ref 11.7–15.4)
WBC: 5.7 10*3/uL (ref 3.4–10.8)

## 2023-01-23 LAB — TSH: TSH: 1.12 u[IU]/mL (ref 0.450–4.500)

## 2023-01-23 LAB — IRON,TIBC AND FERRITIN PANEL
Ferritin: 112 ng/mL (ref 15–150)
Iron Saturation: 36 % (ref 15–55)
Iron: 93 ug/dL (ref 27–139)
Total Iron Binding Capacity: 256 ug/dL (ref 250–450)
UIBC: 163 ug/dL (ref 118–369)

## 2023-02-01 ENCOUNTER — Other Ambulatory Visit: Payer: Self-pay | Admitting: Family Medicine

## 2023-02-01 DIAGNOSIS — I10 Essential (primary) hypertension: Secondary | ICD-10-CM

## 2023-03-14 ENCOUNTER — Other Ambulatory Visit: Payer: Self-pay | Admitting: Family Medicine

## 2023-03-14 DIAGNOSIS — I10 Essential (primary) hypertension: Secondary | ICD-10-CM

## 2023-03-17 ENCOUNTER — Emergency Department (HOSPITAL_COMMUNITY)
Admission: EM | Admit: 2023-03-17 | Discharge: 2023-03-17 | Disposition: A | Discharge status: Home / Self Care | Payer: Medicare Other | Attending: Emergency Medicine | Admitting: Emergency Medicine

## 2023-03-17 ENCOUNTER — Emergency Department (HOSPITAL_COMMUNITY): Payer: Medicare Other

## 2023-03-17 ENCOUNTER — Other Ambulatory Visit: Payer: Self-pay

## 2023-03-17 ENCOUNTER — Encounter (HOSPITAL_COMMUNITY): Payer: Self-pay

## 2023-03-17 DIAGNOSIS — Z79899 Other long term (current) drug therapy: Secondary | ICD-10-CM | POA: Diagnosis not present

## 2023-03-17 DIAGNOSIS — I10 Essential (primary) hypertension: Secondary | ICD-10-CM | POA: Insufficient documentation

## 2023-03-17 DIAGNOSIS — R42 Dizziness and giddiness: Secondary | ICD-10-CM | POA: Diagnosis not present

## 2023-03-17 DIAGNOSIS — R4182 Altered mental status, unspecified: Secondary | ICD-10-CM | POA: Diagnosis not present

## 2023-03-17 LAB — BASIC METABOLIC PANEL
Anion gap: 9 (ref 5–15)
BUN: 13 mg/dL (ref 8–23)
CO2: 26 mmol/L (ref 22–32)
Calcium: 9 mg/dL (ref 8.9–10.3)
Chloride: 106 mmol/L (ref 98–111)
Creatinine, Ser: 0.6 mg/dL (ref 0.44–1.00)
GFR, Estimated: >60 mL/min (ref >60)
Glucose, Bld: 108 mg/dL — ABNORMAL HIGH (ref 70–99)
Potassium: 3.5 mmol/L (ref 3.5–5.1)
Sodium: 141 mmol/L (ref 135–145)

## 2023-03-17 LAB — CBC WITH DIFFERENTIAL/PLATELET
Abs Immature Granulocytes: 0.01 10*3/uL (ref 0.00–0.07)
Basophils Absolute: 0.1 10*3/uL (ref 0.0–0.1)
Basophils Relative: 1 %
Eosinophils Absolute: 0.3 10*3/uL (ref 0.0–0.5)
Eosinophils Relative: 5 %
HCT: 41.2 % (ref 36.0–46.0)
Hemoglobin: 14.2 g/dL (ref 12.0–15.0)
Immature Granulocytes: 0 %
Lymphocytes Relative: 27 %
Lymphs Abs: 1.7 10*3/uL (ref 0.7–4.0)
MCH: 30.5 pg (ref 26.0–34.0)
MCHC: 34.5 g/dL (ref 30.0–36.0)
MCV: 88.6 fL (ref 80.0–100.0)
Monocytes Absolute: 0.7 10*3/uL (ref 0.1–1.0)
Monocytes Relative: 12 %
Neutro Abs: 3.5 10*3/uL (ref 1.7–7.7)
Neutrophils Relative %: 55 %
Platelets: 265 10*3/uL (ref 150–400)
RBC: 4.65 MIL/uL (ref 3.87–5.11)
RDW: 12.9 % (ref 11.5–15.5)
WBC: 6.3 10*3/uL (ref 4.0–10.5)
nRBC: 0 % (ref 0.0–0.2)

## 2023-03-17 LAB — CBG MONITORING, ED: Glucose-Capillary: 101 mg/dL — ABNORMAL HIGH (ref 70–99)

## 2023-03-17 MED ORDER — MECLIZINE HCL 12.5 MG PO TABS
25.0000 mg | ORAL_TABLET | Freq: Once | ORAL | Status: AC
  Administered 2023-03-17: 25 mg via ORAL
  Filled 2023-03-17: qty 2

## 2023-03-17 MED ORDER — MECLIZINE HCL 25 MG PO TABS: 25.0000 mg | ORAL_TABLET | Freq: Three times a day (TID) | ORAL | 0 refills | Status: DC | PRN

## 2023-03-17 NOTE — ED Provider Notes (Signed)
[place] EMERGENCY DEPARTMENT AT [facility] Provider Note   CSN: [id number] Arrival date & time: 03/17/23  0272     History  Chief Complaint  Patient presents with   Dizziness    Ashley Holland is a 82 y.o. female.  Patient is an 82 year old female with past medical history of hypertension, hyperlipidemia, osteoarthritis of the hip with prior replacement.  Patient presenting today for evaluation of dizziness.  She woke from sleep in the night to go to the bathroom.  When she sat up, she became dizzy and off balance.  She feels that she could not maintain her equilibrium when she attempted to walk to the bathroom, so had to sit back down on the bed.  She sat there for several minutes, then checked her blood pressure and it read "crisis".  She then called 911 and was brought here.  The dizziness has seemed to resolve, blood blood pressure does remain elevated.  She denies headache or visual disturbances.  She denies weakness or numbness of the extremities.  The history is provided by the patient.       Home Medications Prior to Admission medications   Medication Sig Start Date End Date Taking? Authorizing Provider  amLODipine (NORVASC) 5 MG tablet TAKE 1 TABLET (5 MG TOTAL) BY MOUTH DAILY. 02/01/23   Raliegh Ip, DO  ARTIFICIAL TEAR OP Place 1 drop into both eyes daily as needed (dry eyes).    [provider]  Calcium Carb-Cholecalciferol (CALCIUM HIGH POTENCY/VITAMIN D) 600-5 MG-MCG TABS Take by mouth. 04/01/18   [provider]  diphenhydrAMINE (BENADRYL) 25 MG tablet Take 25 mg by mouth daily as needed for itching.    [provider]  Docusate Sodium (DSS) 100 MG CAPS Take by mouth. 04/01/18   [provider]  hydrochlorothiazide (MICROZIDE) 12.5 MG capsule TAKE 1 CAPSULE BY MOUTH EVERY DAY 03/14/23   Delynn Flavin M, DO      Allergies    Patient has no known allergies.    Review of Systems   Review of Systems   All other systems reviewed and are negative.   Physical Exam Updated Vital Signs BP (!) 202/149 (BP Location: Right Arm)   Pulse 66   Temp 97.7 F (36.5 C) (Oral)   Resp 18   Ht 5\' 2"  (1.575 m)   Wt 69.2 kg   SpO2 98%   BMI 27.89 kg/m  Physical Exam Vitals and nursing note reviewed.  Constitutional:      General: She is not in acute distress.    Appearance: She is well-developed. She is not diaphoretic.  HENT:     Head: Normocephalic and atraumatic.  Eyes:     Extraocular Movements: Extraocular movements intact.     Pupils: Pupils are equal, round, and reactive to light.  Cardiovascular:     Rate and Rhythm: Normal rate and regular rhythm.     Heart sounds: No murmur heard.    No friction rub. No gallop.  Pulmonary:     Effort: Pulmonary effort is normal. No respiratory distress.     Breath sounds: Normal breath sounds. No wheezing.  Abdominal:     General: Bowel sounds are normal. There is no distension.     Palpations: Abdomen is soft.     Tenderness: There is no abdominal tenderness.  Musculoskeletal:        General: Normal range of motion.     Cervical back: Normal range of motion and neck supple.  Skin:    General: Skin is warm and dry.  Neurological:     General: No focal deficit present.     Mental Status: She is alert and oriented to person, place, and time.     Cranial Nerves: No cranial nerve deficit.     Motor: No weakness.     Coordination: Coordination normal.     ED Results / Procedures / Treatments   Labs (all labs ordered are listed, but only abnormal results are displayed) Labs Reviewed  CBG MONITORING, ED - Abnormal; Notable for the following components:      Result Value   Glucose-Capillary 101 (*)    All other components within normal limits    EKG EKG Interpretation Date/Time:  Sunday March 17 2023 03:38:33 EST Ventricular Rate:  64 PR Interval:  178 QRS Duration:  103 QT Interval:  447 QTC Calculation: 462 R  Axis:   17  Text Interpretation: Sinus rhythm Normal ECG Confirmed by Geoffery Lyons ([id number]) on 03/17/2023 3:46:16 AM  Radiology No results found.  Procedures Procedures    Medications Ordered in ED Medications  meclizine (ANTIVERT) tablet 25 mg (has no administration in time range)    ED Course/ Medical Decision Making/ A&P  Patient is an 82 year old female presenting with complaints of dizziness as described in the HPI.  Patient arrives here with stable vital signs and is afebrile.  Physical examination is unremarkable and she is neurologically intact.  Laboratory studies obtained including CBC and metabolic panel, both of which are unremarkable.  CT scan of the head obtained showing no acute intracranial process.  Patient has been given meclizine and seems to be feeling markedly improved.  Her dizziness has resolved.  I highly suspect a peripheral vertigo and we will discharge the patient with meclizine and as needed return.  Final Clinical Impression(s) / ED Diagnoses Final diagnoses:  None    Rx / DC Orders ED Discharge Orders     None         Geoffery Lyons, MD 03/17/23 [phone]

## 2023-03-17 NOTE — Discharge Instructions (Addendum)
Begin taking meclizine as prescribed.  Follow-up with your primary doctor in the next 1 to 2 weeks, and return to the ER if your symptoms significantly worsen or change.

## 2023-03-17 NOTE — ED Triage Notes (Signed)
BIB [facility] after being called to pt residence due to dizziness. Pt stated that she was getting up to go to the restroom and was unable to stand without "feeling drunk". Pt also took her BP at home during this period and her machine read "crisis". Denies falling or LOC.

## 2023-03-18 ENCOUNTER — Telehealth: Payer: Self-pay

## 2023-03-18 NOTE — Telephone Encounter (Signed)
Copied from CRM [id number]. Topic: Clinical - Medical Advice >> Mar 18, 2023  8:10 AM Desma Mcgregor wrote: Reason for CRM: Pt went to the ER yesterday for dizziness, vertigo, and high blood pressure over 200. Requesting to speak to Dr Nadine Counts about her BP medication. CB# [phone]

## 2023-03-18 NOTE — Telephone Encounter (Signed)
-----   Message from [place] sent at 03/18/2023  2:53 PM EST ----- Neighbor informed me she had very HIGH bp and went to ED.  If needs to be seen, please offer DOD tomorrow.

## 2023-03-18 NOTE — Telephone Encounter (Signed)
Attempted to call pt , no answer left vm

## 2023-03-19 ENCOUNTER — Telehealth: Payer: Self-pay | Admitting: Family Medicine

## 2023-03-19 NOTE — Telephone Encounter (Signed)
Copied from CRM [id number]. Topic: Clinical - Medication Question >> Mar 19, 2023  2:21 PM Ashley Holland wrote: Reason for CRM: Patient needs to see about getting a higher dose for blood pressure, it has been up and down but higher recently paitient has record of the blood pressure levels

## 2023-03-19 NOTE — Telephone Encounter (Signed)
Pt needs appt, please see previous telephone encounter regarding this. Will close encounter.

## 2023-03-19 NOTE — Telephone Encounter (Signed)
Pt went to ED and her BP had been 201/149 and Dr Reece Agar in previous telephone encounter had wanted pt to be seen with DOD so I spoke to pt and she states her BP readings since then have been 140-150 over 80-90's and scheduled her with DOD tomorrow at 2:35.

## 2023-03-20 ENCOUNTER — Encounter: Payer: Self-pay | Admitting: Family Medicine

## 2023-03-20 ENCOUNTER — Ambulatory Visit (HOSPITAL_COMMUNITY)
Admission: RE | Admit: 2023-03-20 | Discharge: 2023-03-20 | Disposition: A | Discharge status: Home / Self Care | Payer: Medicare Other | Source: Ambulatory Visit | Attending: Family Medicine | Admitting: Family Medicine

## 2023-03-20 ENCOUNTER — Ambulatory Visit (INDEPENDENT_AMBULATORY_CARE_PROVIDER_SITE_OTHER): Payer: Medicare Other | Admitting: Family Medicine

## 2023-03-20 VITALS — BP 194/99 | HR 92 | Temp 97.9°F | Ht 62.0 in | Wt 154.8 lb

## 2023-03-20 DIAGNOSIS — K8689 Other specified diseases of pancreas: Secondary | ICD-10-CM | POA: Diagnosis not present

## 2023-03-20 DIAGNOSIS — R109 Unspecified abdominal pain: Secondary | ICD-10-CM

## 2023-03-20 DIAGNOSIS — N281 Cyst of kidney, acquired: Secondary | ICD-10-CM | POA: Diagnosis not present

## 2023-03-20 DIAGNOSIS — I10 Essential (primary) hypertension: Secondary | ICD-10-CM

## 2023-03-20 DIAGNOSIS — Q630 Accessory kidney: Secondary | ICD-10-CM | POA: Diagnosis not present

## 2023-03-20 DIAGNOSIS — R1012 Left upper quadrant pain: Secondary | ICD-10-CM | POA: Diagnosis not present

## 2023-03-20 DIAGNOSIS — K769 Liver disease, unspecified: Secondary | ICD-10-CM | POA: Diagnosis not present

## 2023-03-20 LAB — URINALYSIS, COMPLETE
Bilirubin, UA: NEGATIVE
Glucose, UA: NEGATIVE
Ketones, UA: NEGATIVE
Leukocytes,UA: NEGATIVE
Nitrite, UA: NEGATIVE
Protein,UA: NEGATIVE
RBC, UA: NEGATIVE
Specific Gravity, UA: 1.025 (ref 1.005–1.030)
Urobilinogen, Ur: 0.2 mg/dL (ref 0.2–1.0)
pH, UA: 7 (ref 5.0–7.5)

## 2023-03-20 LAB — MICROSCOPIC EXAMINATION
Epithelial Cells (non renal): NONE SEEN /[HPF] (ref 0–10)
RBC, Urine: NONE SEEN /[HPF] (ref 0–2)
Renal Epithel, UA: NONE SEEN /[HPF]
WBC, UA: NONE SEEN /[HPF] (ref 0–5)
Yeast, UA: NONE SEEN

## 2023-03-20 MED ORDER — AMLODIPINE BESYLATE 10 MG PO TABS: 10.0000 mg | ORAL_TABLET | Freq: Every day | ORAL | 1 refills | Status: DC

## 2023-03-20 NOTE — Progress Notes (Signed)
Subjective:  Patient ID: Ashley Holland, female    DOB: 11-08-1940  Age: 82 y.o. MRN: [id number]  CC: ER FOLLOW UP (VERTIGO AND HTN) and Flank Pain (LEFT SIDE PAIN)   HPI MARIETA DIMAMBRO presents for   Righ flank paIN INTERMITTENT for 1-2 hours. Severe sharp 8/10 waxes and wanes. Will last 5-10 minutes at a time at 8/10.  No fever or nausea. No dysuria.     11/23/2022    9:03 AM 06/20/2022   10:51 AM 04/26/2022   11:09 AM  Depression screen PHQ 2/9  Decreased Interest 0 0 0  Down, Depressed, Hopeless 0 0 0  PHQ - 2 Score 0 0 0  Altered sleeping 0 0   Tired, decreased energy 0 0   Change in appetite 0 0   Feeling bad or failure about yourself  0 0   Trouble concentrating 0 0   Moving slowly or fidgety/restless 0 0   Suicidal thoughts 0 0   PHQ-9 Score 0 0   Difficult doing work/chores Not difficult at all Not difficult at all     History Adilen has a past medical history of Arthritis, Enlarged thyroid, Female bladder prolapse, GERD (gastroesophageal reflux disease) (05/19/2013), Malignant neoplasm of breast (female), unspecified site, Nontoxic multinodular goiter (11/10/2015), Other and unspecified hyperlipidemia, Rectal prolapse, and Unspecified essential hypertension.   She has a past surgical history that includes Abdominal hysterectomy; Cholecystectomy; Colonoscopy; Breast lumpectomy (2010); Upper gastrointestinal endoscopy (Sept 2012); EUS (04/25/2011); Total hip arthroplasty (Right, 07/26/2014); and Total hip arthroplasty (Left, 10/12/2020).   Her family history includes Alcohol abuse in her father; Arthritis in her brother; Coronary artery disease (age of onset: 44) in her mother; Hyperlipidemia in her mother; Hypertension in her brother, father, and sister; Stroke in her mother.She reports that she has never smoked. She has never used smokeless tobacco. She reports that she does not drink alcohol and does not use drugs.    ROS Review of Systems  Constitutional: Negative.    HENT: Negative.    Eyes:  Negative for visual disturbance.  Respiratory:  Negative for shortness of breath.   Cardiovascular:  Negative for chest pain.  Gastrointestinal:  Negative for abdominal pain.  Genitourinary:  Positive for flank pain.  Musculoskeletal:  Negative for arthralgias.    Objective:  BP (!) 194/99   Pulse 92   Temp 97.9 F (36.6 C)   Ht 5\' 2"  (1.575 m)   Wt 154 lb 12.8 oz (70.2 kg)   SpO2 97%   BMI 28.31 kg/m   BP Readings from Last 3 Encounters:  03/20/23 (!) 194/99  03/17/23 (!) 168/87  01/22/23 (!) 142/86    Wt Readings from Last 3 Encounters:  03/20/23 154 lb 12.8 oz (70.2 kg)  03/17/23 152 lb 8 oz (69.2 kg)  01/22/23 150 lb 3.2 oz (68.1 kg)     Physical Exam Constitutional:      General: She is not in acute distress.    Appearance: She is well-developed.  Cardiovascular:     Rate and Rhythm: Normal rate and regular rhythm.  Pulmonary:     Breath sounds: Normal breath sounds.  Abdominal:     Tenderness: There is abdominal tenderness (right flank and CVA).  Musculoskeletal:        General: Normal range of motion.  Skin:    General: Skin is warm and dry.  Neurological:     Mental Status: She is alert and oriented to person, place, and time.  Assessment & Plan:   Lilijana was seen today for er follow up and flank pain.  Diagnoses and all orders for this visit:  Flank pain -     Urinalysis, Complete  Primary hypertension -     amLODipine (NORVASC) 10 MG tablet; Take 1 tablet (10 mg total) by mouth daily.  Accelerated hypertension  Left flank pain -     CT RENAL STONE STUDY; Future  Left upper quadrant abdominal pain  Other orders -     Microscopic Examination       I have changed Miela J. Malson's amLODipine. I am also having her maintain her ARTIFICIAL TEAR OP, diphenhydrAMINE, DSS, Calcium Carb-Cholecalciferol, hydrochlorothiazide, and meclizine.  Allergies as of 03/20/2023   No Known Allergies       Medication List        Accurate as of March 20, 2023 11:59 PM. If you have any questions, ask your nurse or doctor.          amLODipine 10 MG tablet Commonly known as: NORVASC Take 1 tablet (10 mg total) by mouth daily. What changed:  medication strength how much to take Changed by: Helen Winterhalter   ARTIFICIAL TEAR OP Place 1 drop into both eyes daily as needed (dry eyes).   Calcium High Potency/Vitamin D 600-5 MG-MCG Tabs Generic drug: Calcium Carb-Cholecalciferol Take by mouth.   diphenhydrAMINE 25 MG tablet Commonly known as: BENADRYL Take 25 mg by mouth daily as needed for itching.   DSS 100 MG Caps Take by mouth.   hydrochlorothiazide 12.5 MG capsule Commonly known as: MICROZIDE TAKE 1 CAPSULE BY MOUTH EVERY DAY   meclizine 25 MG tablet Commonly known as: ANTIVERT Take 1 tablet (25 mg total) by mouth 3 (three) times daily as needed for dizziness.         Follow-up: No follow-ups on file.  Mechele Claude, M.D.

## 2023-03-24 ENCOUNTER — Other Ambulatory Visit: Payer: Self-pay | Admitting: Family Medicine

## 2023-03-24 DIAGNOSIS — K769 Liver disease, unspecified: Secondary | ICD-10-CM

## 2023-03-25 ENCOUNTER — Telehealth: Payer: Self-pay | Admitting: Family Medicine

## 2023-03-25 ENCOUNTER — Other Ambulatory Visit: Payer: Self-pay | Admitting: Family Medicine

## 2023-03-25 DIAGNOSIS — R932 Abnormal findings on diagnostic imaging of liver and biliary tract: Secondary | ICD-10-CM

## 2023-03-25 NOTE — Telephone Encounter (Signed)
Reviewed CT with pt. Negative for renal stone. Moderate amounts of stool present. Uncertain new lesion on liver. Initial provider has already ordered MRI liver protocol to further evaluate. Pt feeling somewhat better.    CT RENAL STONE STUDY  Result Date: 03/20/2023 CLINICAL DATA:  Abdominal/flank pain, stone suspected EXAM: CT ABDOMEN AND PELVIS WITHOUT CONTRAST TECHNIQUE: Multidetector CT imaging of the abdomen and pelvis was performed following the standard protocol without IV contrast. RADIATION DOSE REDUCTION: This exam was performed according to the departmental dose-optimization program which includes automated exposure control, adjustment of the mA and/or kV according to patient size and/or use of iterative reconstruction technique. COMPARISON:  03/29/2011 FINDINGS: Lower chest: No acute abnormality. Hepatobiliary: 2.5 cm intermediate attenuation lesion within the posterior right hepatic lobe (series 2, image 23), not seen on the prior study. Prior cholecystectomy. Similar degree of intra and extrahepatic biliary dilatation likely reflective of a post cholecystectomy state. Pancreas: Pancreatic atrophy.  No inflammatory changes. Spleen: Normal in size without focal abnormality. Adrenals/Urinary Tract: Unremarkable adrenal glands. Simple right renal cyst which does not require follow-up imaging. Duplicated left renal collecting system. Kidneys appear otherwise unremarkable. No renal stone or hydronephrosis. Urinary bladder is within normal limits. Stomach/Bowel: Stomach within normal limits. No dilated loops of bowel. Moderate-large volume stool throughout the colon. No focal bowel wall thickening or inflammatory changes are identified. Vascular/Lymphatic: Aortic atherosclerosis. No enlarged abdominal or pelvic lymph nodes. Reproductive: Status post hysterectomy. No adnexal masses. Other: No free fluid. No abdominopelvic fluid collection. No pneumoperitoneum. No abdominal wall hernia. Musculoskeletal:  Bilateral total hip arthroplasties. Multilevel lumbar spondylosis. No acute bony abnormality. Intraosseous hemangioma in the L3 vertebral body. No suspicious bone lesion. IMPRESSION: 1. No acute abdominopelvic findings. Specifically, no evidence of obstructive uropathy. 2. Moderate-large volume stool throughout the colon. Correlate for constipation. 3. Indeterminate 2.5 cm lesion within the posterior right hepatic lobe, not seen on the prior study. Further evaluation with non-emergent liver protocol MRI is recommended. 4. Aortic atherosclerosis (ICD10-I70.0). Electronically Signed   By: Duanne Guess D.O.   On: 03/20/2023 19:33

## 2023-03-28 ENCOUNTER — Encounter: Payer: Self-pay | Admitting: Family Medicine

## 2023-04-05 ENCOUNTER — Ambulatory Visit (HOSPITAL_COMMUNITY)
Admission: RE | Admit: 2023-04-05 | Discharge: 2023-04-05 | Disposition: A | Discharge status: Home / Self Care | Payer: Medicare Other | Source: Ambulatory Visit | Attending: Family Medicine | Admitting: Family Medicine

## 2023-04-05 DIAGNOSIS — N281 Cyst of kidney, acquired: Secondary | ICD-10-CM | POA: Diagnosis not present

## 2023-04-05 DIAGNOSIS — I700 Atherosclerosis of aorta: Secondary | ICD-10-CM | POA: Diagnosis not present

## 2023-04-05 DIAGNOSIS — C50919 Malignant neoplasm of unspecified site of unspecified female breast: Secondary | ICD-10-CM | POA: Diagnosis not present

## 2023-04-05 DIAGNOSIS — K769 Liver disease, unspecified: Secondary | ICD-10-CM | POA: Insufficient documentation

## 2023-04-05 MED ORDER — GADOBUTROL 1 MMOL/ML IV SOLN
7.0000 mL | Freq: Once | INTRAVENOUS | Status: AC | PRN
  Administered 2023-04-05: 7 mL via INTRAVENOUS

## 2023-04-26 ENCOUNTER — Ambulatory Visit: Payer: Self-pay | Admitting: Family Medicine

## 2023-04-26 NOTE — Telephone Encounter (Signed)
  Chief Complaint: Left Knee Swelling Symptoms: Swelling size of walnut Frequency: constant since 04/24/23 Pertinent Negatives: Patient denies Redness, warmth, red streaks Disposition: [] ED /[] Urgent Care (no appt availability in office) / [x] Appointment(In office/virtual)/ []  [facility]/ [] Home Care/ [] Refused Recommended Disposition /[] [facility]/ []  Follow-up with PCP Additional Notes: Patient called in stating she has had left knee swelling since 04/24/23. States that it is worse when she wakes up in the morning and gets a little better throughout the day. Patient has tried ibuprofen  to help with inflammation and pain. Patient is having some ambulation issues due to swelling. Patient states she will follow home care advice over the weekend to see if that will help but would like to be evaluated in office on Monday.    Copied from CRM [id number]. Topic: Clinical - Red Word Triage >> Apr 26, 2023  9:44 AM Laurier BROCKS wrote: Red Word that prompted transfer to Nurse Triage: Patient woke up and could barely walk. She is experiencing swelling in the left knee with a lump  the size of a walnut. Reason for Disposition  MILD or MODERATE swelling (e.g., can't move joint normally, can't do usual activities) (Exceptions: Itchy, localized swelling; swelling is chronic.)  Answer Assessment - Initial Assessment Questions 1. LOCATION: Where is the swelling located?  (e.g., left, right, both knees)     Left knee, about the size of a walnut  2. ONSET: When did the swelling start? Does it come and go, or is it there all the time?     Evening 04/24/23 3. SWELLING: How bad is the swelling? Or, How large is it? (e.g., mild, moderate, severe; size of localized swelling)    - NONE: No joint swelling.   - LOCALIZED: Localized; small area of puffy or swollen skin (e.g., insect bite, skin irritation).   - MILD: Joint looks or feels mildly swollen or puffy.   - MODERATE: Swollen; interferes  with normal activities (e.g., work or school); can't move joint normally (bend and straighten completely); may be limping.   - SEVERE: Very swollen; can't move swollen joint at all; limping a lot or unable to walk.     Tenderness, if you touch it, it's a tender, about the size of a walnut 4. PAIN: Is there any pain? If Yes, ask: How bad is it? (Scale 1-10; or mild, moderate, severe)   - NONE (0): no pain.   - MILD (1-3): doesn't interfere with normal activities.    - MODERATE (4-7): interferes with normal activities (e.g., work or school) or awakens from sleep, limping.    - SEVERE (8-10): excruciating pain, unable to do any normal activities, unable to walk.      Only when you push on it, it's a 3/10 5. SETTING: Has there been any recent work, exercise or other activity that involved that part of the body?      Nothing that I have been on my knees but I did do a whole lot of mopping that day and walking 6. AGGRAVATING FACTORS: What makes the knee swelling worse? (e.g., walking, climbing stairs, running)     unsure 7. ASSOCIATED SYMPTOMS: Is there any pain or redness?     No redness, no warmth 8. OTHER SYMPTOMS: Do you have any other symptoms? (e.g., chest pain, difficulty breathing, fever, calf pain)     No  Protocols used: Knee Swelling-A-AH

## 2023-04-29 ENCOUNTER — Ambulatory Visit: Payer: Medicare Other | Admitting: Family Medicine

## 2023-05-08 ENCOUNTER — Encounter: Payer: Self-pay | Admitting: Family Medicine

## 2023-05-08 ENCOUNTER — Ambulatory Visit: Payer: Medicare Other | Admitting: Family Medicine

## 2023-05-08 VITALS — BP 130/77 | HR 73 | Temp 97.8°F | Ht 62.0 in | Wt 151.4 lb

## 2023-05-08 DIAGNOSIS — Z658 Other specified problems related to psychosocial circumstances: Secondary | ICD-10-CM

## 2023-05-08 DIAGNOSIS — I10 Essential (primary) hypertension: Secondary | ICD-10-CM | POA: Diagnosis not present

## 2023-05-08 DIAGNOSIS — K5909 Other constipation: Secondary | ICD-10-CM

## 2023-05-08 MED ORDER — AMLODIPINE BESYLATE 10 MG PO TABS: 10.0000 mg | ORAL_TABLET | Freq: Every day | ORAL | 3 refills | Status: AC

## 2023-05-08 MED ORDER — HYDROCHLOROTHIAZIDE 12.5 MG PO CAPS: 12.5000 mg | ORAL_CAPSULE | Freq: Every day | ORAL | 3 refills | Status: AC

## 2023-05-08 NOTE — Progress Notes (Signed)
 Subjective: JX:Ashley Holland follow-up PCP: Eliodoro Guerin, DO Ashley Holland:Ashley Holland is a 83 y.o. female presenting to clinic today for:  1.  Hypertension/constipation Patient was seen on 11 27 2024 for flank pain.  She was found to have quite a bit of stool.  She notes that she took MiraLAX  as prescribed but it caused the opposite problem where things just got too messy so she discontinued.  She is now try to focus on increased fiber intake and physical exercise.  Will be hopefully joining some classes at the rec center soon.  Her blood pressure was elevated during that visit.  She is been compliant with blood pressure medications.  Her blood pressures are typically running somewhere between 120 and 130 over 70s.  No chest pain, shortness of breath, dizziness, cramping or edema  Continues to stay active with puzzles.  She is learning to live independently after the passing of her husband and her friend moving to [place] .   ROS: Per HPI  No Known Allergies Past Medical History:  Diagnosis Date   Arthritis    Per medical history form dated 11/04/09.past history DJD back   Enlarged thyroid     "was told had nodules" - no swallowing problems   Female bladder prolapse    GERD (gastroesophageal reflux disease) 05/19/2013   Malignant neoplasm of breast (female), unspecified site    right breast cancer. surgery, radiation, oral meds- Arimidex .   Nontoxic multinodular goiter 11/10/2015   Other and unspecified hyperlipidemia    Rectal prolapse    stool softeners helpful.   Unspecified essential hypertension     Current Outpatient Medications:    amLODipine  (NORVASC ) 10 MG tablet, Take 1 tablet (10 mg total) by mouth daily., Disp: 90 tablet, Rfl: 1   ARTIFICIAL TEAR OP, Place 1 drop into both eyes daily as needed (dry eyes)., Disp: , Rfl:    Calcium Carb-Cholecalciferol (CALCIUM HIGH POTENCY/VITAMIN D) 600-5 MG-MCG TABS, Take by mouth., Disp: , Rfl:    diphenhydrAMINE  (BENADRYL ) 25 MG  tablet, Take 25 mg by mouth daily as needed for itching., Disp: , Rfl:    Docusate Sodium  (DSS) 100 MG CAPS, Take by mouth., Disp: , Rfl:    hydrochlorothiazide  (MICROZIDE ) 12.5 MG capsule, TAKE 1 CAPSULE BY MOUTH EVERY DAY, Disp: 90 capsule, Rfl: 0   meclizine  (ANTIVERT ) 25 MG tablet, Take 1 tablet (25 mg total) by mouth 3 (three) times daily as needed for dizziness., Disp: 20 tablet, Rfl: 0 Social History   Socioeconomic History   Marital status: Widowed    Spouse name: John   Number of children: 2   Years of education: Not on file   Highest education level: 12th grade  Occupational History   Occupation: Retired    Comment: Futures trader  Tobacco Use   Smoking status: Never   Smokeless tobacco: Never  Vaping Use   Vaping status: Never Used  Substance and Sexual Activity   Alcohol use: No   Drug use: No   Sexual activity: Not Currently  Other Topics Concern   Not on file  Social History Narrative   One level home with her husband who has dementia -she is his caregiver   Social Drivers of Corporate investment banker Strain: Low Risk  (05/04/2023)   Overall Financial Resource Strain (CARDIA)    Difficulty of Paying Living Expenses: Not very hard  Food Insecurity: No Food Insecurity (05/04/2023)   Hunger Vital Sign    Worried About Running Out of Food in  the Last Year: Never true    Ran Out of Food in the Last Year: Never true  Transportation Needs: No Transportation Needs (05/04/2023)   PRAPARE - Administrator, Civil Service (Medical): No    Lack of Transportation (Non-Medical): No  Physical Activity: Sufficiently Active (05/04/2023)   Exercise Vital Sign    Days of Exercise per Week: 5 days    Minutes of Exercise per Session: 30 min  Stress: No Stress Concern Present (05/04/2023)   [employer] of Occupational Health - Occupational Stress Questionnaire    Feeling of Stress : Not at all  Social Connections: Moderately Isolated (05/04/2023)   Social  Connection and Isolation Panel [NHANES]    Frequency of Communication with Friends and Family: More than three times a week    Frequency of Social Gatherings with Friends and Family: Three times a week    Attends Religious Services: More than 4 times per year    Active Member of Clubs or Organizations: No    Attends Banker Meetings: Patient declined    Marital Status: Widowed  Intimate Partner Violence: Unknown (07/25/2021)   Received from [facility], [facility]   HITS    Physically Hurt: Not on file    Insult or Talk Down To: Not on file    Threaten Physical Harm: Not on file    Scream or Curse: Not on file   Family History  Problem Relation Age of Onset   Coronary artery disease Mother 87        CABG age 32   Hyperlipidemia Mother    Stroke Mother    Alcohol abuse Father    Hypertension Father    Hypertension Sister    Hypertension Brother    Arthritis Brother    Anesthesia problems Neg Hx    Hypotension Neg Hx    Malignant hyperthermia Neg Hx    Pseudochol deficiency Neg Hx     Objective: Office vital signs reviewed. BP 130/77   Pulse 73   Temp 97.8 F (36.6 C)   Ht 5\' 2"  (1.575 m)   Wt 151 lb 6.4 oz (68.7 kg)   SpO2 98%   BMI 27.69 kg/m   Physical Examination:  General: Awake, alert, well nourished, No acute distress HEENT: sclera white, MMM Cardio: regular rate and rhythm, S1S2 heard, no murmurs appreciated Pulm: clear to auscultation bilaterally, no wheezes, rhonchi or rales; normal work of breathing on room air  Assessment/ Plan: 83 y.o. female   Primary hypertension - Plan: amLODipine  (NORVASC ) 10 MG tablet, hydrochlorothiazide  (MICROZIDE ) 12.5 MG capsule  Other constipation  At risk for loneliness  Blood pressure under excellent control.  No changes needed.  I renewed her amlodipine  and HCTZ placed on file.  We discussed naturopathic ways to manage constipation.  Should she find that she needs to go back to MiraLAX , advised only  once daily to reduce loose stools.  Could also consider alternatively utilization of Linzess or Trulance if MiraLAX  proves to be too stimulating.  She is actively trying to find activities to feel her day.  She seems to be well supported by friends and family.  Will continue to monitor her closely given new life changes over the last year  Ormond Lazo M Jaquayla Hege, DO [facility] [phone]

## 2023-05-24 DIAGNOSIS — Z961 Presence of intraocular lens: Secondary | ICD-10-CM | POA: Diagnosis not present

## 2023-05-24 DIAGNOSIS — H43813 Vitreous degeneration, bilateral: Secondary | ICD-10-CM | POA: Diagnosis not present

## 2023-07-02 ENCOUNTER — Encounter: Payer: Self-pay | Admitting: *Deleted

## 2023-07-22 DIAGNOSIS — R3915 Urgency of urination: Secondary | ICD-10-CM | POA: Diagnosis not present

## 2023-07-22 DIAGNOSIS — R300 Dysuria: Secondary | ICD-10-CM | POA: Diagnosis not present

## 2023-07-22 DIAGNOSIS — R350 Frequency of micturition: Secondary | ICD-10-CM | POA: Diagnosis not present

## 2023-07-22 DIAGNOSIS — N3001 Acute cystitis with hematuria: Secondary | ICD-10-CM | POA: Diagnosis not present

## 2023-08-01 DIAGNOSIS — L7211 Pilar cyst: Secondary | ICD-10-CM | POA: Diagnosis not present

## 2023-08-01 DIAGNOSIS — D225 Melanocytic nevi of trunk: Secondary | ICD-10-CM | POA: Diagnosis not present

## 2023-08-01 DIAGNOSIS — Z1283 Encounter for screening for malignant neoplasm of skin: Secondary | ICD-10-CM | POA: Diagnosis not present

## 2023-08-21 DIAGNOSIS — S70262A Insect bite (nonvenomous), left hip, initial encounter: Secondary | ICD-10-CM | POA: Diagnosis not present

## 2023-10-10 DIAGNOSIS — L7211 Pilar cyst: Secondary | ICD-10-CM | POA: Diagnosis not present

## 2023-11-26 DIAGNOSIS — Z1231 Encounter for screening mammogram for malignant neoplasm of breast: Secondary | ICD-10-CM | POA: Diagnosis not present

## 2023-11-26 LAB — HM MAMMOGRAPHY

## 2023-11-28 ENCOUNTER — Encounter: Payer: Self-pay | Admitting: Family Medicine

## 2023-12-20 ENCOUNTER — Encounter: Payer: Medicare Other | Admitting: Family Medicine

## 2023-12-26 ENCOUNTER — Encounter: Payer: Self-pay | Admitting: Family Medicine

## 2023-12-26 ENCOUNTER — Ambulatory Visit: Admitting: Family Medicine

## 2023-12-26 ENCOUNTER — Ambulatory Visit (INDEPENDENT_AMBULATORY_CARE_PROVIDER_SITE_OTHER)

## 2023-12-26 VITALS — BP 131/79 | HR 78 | Temp 97.7°F | Ht 62.0 in | Wt 149.0 lb

## 2023-12-26 DIAGNOSIS — R413 Other amnesia: Secondary | ICD-10-CM | POA: Diagnosis not present

## 2023-12-26 DIAGNOSIS — E7800 Pure hypercholesterolemia, unspecified: Secondary | ICD-10-CM

## 2023-12-26 DIAGNOSIS — D508 Other iron deficiency anemias: Secondary | ICD-10-CM

## 2023-12-26 DIAGNOSIS — I10 Essential (primary) hypertension: Secondary | ICD-10-CM

## 2023-12-26 DIAGNOSIS — Z780 Asymptomatic menopausal state: Secondary | ICD-10-CM

## 2023-12-26 DIAGNOSIS — Z8639 Personal history of other endocrine, nutritional and metabolic disease: Secondary | ICD-10-CM | POA: Diagnosis not present

## 2023-12-26 LAB — LIPID PANEL

## 2023-12-26 NOTE — Progress Notes (Signed)
 Ashley Holland is a 83 y.o. female presents to office today for annual physical exam examination.    Discussed the use of AI scribe software for clinical note transcription with the patient, who gave verbal consent to proceed.  History of Present Illness   Ashley Holland is an 83 year old female who presents with concerns about forgetfulness and leg swelling.  She has experienced increased forgetfulness, which she attributes to stress from her daughter and granddaughter moving in with her a month ago. She feels 'uptight a lot' due to the change in her living situation and her daughter's sensitivity. She can remember things ten minutes later but is concerned about her overall memory.  She reports increased swelling in her legs, particularly in the lower part, which she attributes to the heat. The patient reports increased swelling in her legs, particularly in the lower part, and notes that the swelling is worse with heat. She is currently taking hydrochlorothiazide  in the morning for blood pressure and fluid management, and she reports that her blood pressure readings have been good. She mentions a history of issues with heat and swelling.  She has stopped taking stool softeners and instead follows a regimen of three prunes in the morning, a combination probiotic/prebiotic, and a gel supplement, which she finds effective in maintaining regular bowel movements. She denies any diarrhea, constipation, or blood in stool.  Her diet includes Raisin Bran with half a banana every morning and more salads and vegetables throughout the day. She also takes a calcium vitamin D supplement.  No major vision changes, hearing problems, difficulty swallowing, chest pain, shortness of breath, breast concerns, abdominal pain, nausea, vomiting, urinary incontinence, or skin changes. Reports easy bruising, which she attributes to aging and a recent incident with a dog that caused skin tears.      Occupation:  retired, Marital status: widowed, Substance use: none Health Maintenance Due  Topic Date Due   Medicare Annual Wellness (AWV)  03/06/2022   Refills needed today: all  Immunization History  Administered Date(s) Administered   Fluad Quad(high Dose 65+) 02/10/2019, 04/20/2020, 02/13/2022   Fluad Trivalent(High Dose 65+) 01/23/2023   INFLUENZA, HIGH DOSE SEASONAL PF 02/29/2016, 01/17/2018   Influenza Split 01/08/2013   Influenza,inj,Quad PF,6+ Mos 03/09/2014, 01/18/2015   Influenza,trivalent, recombinat, inj, PF 01/08/2013   Influenza-Unspecified 01/03/2013   Moderna Sars-Covid-2 Vaccination 06/17/2019, 07/15/2019, 03/02/2020   Pneumococcal Conjugate-13 10/20/2019   Pneumococcal-Unspecified 04/24/2007   Tdap 09/01/2018   Past Medical History:  Diagnosis Date   Arthritis    Per medical history form dated 11/04/09.past history DJD back   Autoimmune thyroiditis 02/21/2014   Enlarged thyroid     was told had nodules - no swallowing problems   Female bladder prolapse    GERD (gastroesophageal reflux disease) 05/19/2013   Malignant neoplasm of breast (female), unspecified site    right breast cancer. surgery, radiation, oral meds- Arimidex .   Malignant neoplasm of right breast (HCC) 12/10/2011   IDC; T1c,No;  ER+; Her-2 neg.;  Ki-67 18%  11/16/09 - Right PM/SLN  Completed XRT  Adjuvant arimidex      Menopause 09/22/2010   Per medical history form dated 11/04/09.     Nontoxic multinodular goiter 11/10/2015   Other and unspecified hyperlipidemia    Rectal prolapse    stool softeners helpful.   Unspecified essential hypertension    Social History   Socioeconomic History   Marital status: Widowed    Spouse name: Norleen   Number of children: 2  Years of education: Not on file   Highest education level: 12th grade  Occupational History   Occupation: Retired    Comment: Futures trader  Tobacco Use   Smoking status: Never   Smokeless tobacco: Never  Vaping Use   Vaping  status: Never Used  Substance and Sexual Activity   Alcohol use: No   Drug use: No   Sexual activity: Not Currently  Other Topics Concern   Not on file  Social History Narrative   One level home with her husband who has dementia -she is his caregiver   Social Drivers of Corporate investment banker Strain: Low Risk  (12/22/2023)   Overall Financial Resource Strain (CARDIA)    Difficulty of Paying Living Expenses: Not very hard  Food Insecurity: No Food Insecurity (12/22/2023)   Hunger Vital Sign    Worried About Running Out of Food in the Last Year: Never true    Ran Out of Food in the Last Year: Never true  Transportation Needs: No Transportation Needs (12/22/2023)   PRAPARE - Administrator, Civil Service (Medical): No    Lack of Transportation (Non-Medical): No  Physical Activity: Sufficiently Active (12/22/2023)   Exercise Vital Sign    Days of Exercise per Week: 7 days    Minutes of Exercise per Session: 30 min  Stress: No Stress Concern Present (12/22/2023)   [employer] of Occupational Health - Occupational Stress Questionnaire    Feeling of Stress: Not at all  Social Connections: Moderately Integrated (12/22/2023)   Social Connection and Isolation Panel    Frequency of Communication with Friends and Family: More than three times a week    Frequency of Social Gatherings with Friends and Family: Twice a week    Attends Religious Services: More than 4 times per year    Active Member of [employer] or Organizations: Yes    Attends Banker Meetings: More than 4 times per year    Marital Status: Widowed  Intimate Partner Violence: Unknown (07/25/2021)   Received from [facility]   HITS    Physically Hurt: Not on file    Insult or Talk Down To: Not on file    Threaten Physical Harm: Not on file    Scream or Curse: Not on file   Past Surgical History:  Procedure Laterality Date   ABDOMINAL HYSTERECTOMY     BREAST LUMPECTOMY  2010   Right    CHOLECYSTECTOMY     COLONOSCOPY     EUS  04/25/2011   Procedure: UPPER ENDOSCOPIC ULTRASOUND (EUS) RADIAL;  Surgeon: Elsie CHRISTELLA Cree, MD;  Location: [facility];  Service: Endoscopy;  Laterality: N/A;   TOTAL HIP ARTHROPLASTY Right 07/26/2014   Procedure: RIGHT TOTAL HIP ARTHROPLASTY ANTERIOR APPROACH;  Surgeon: Dempsey Moan, MD;  Location: [facility];  Service: Orthopedics;  Laterality: Right;   TOTAL HIP ARTHROPLASTY Left 10/12/2020   Procedure: TOTAL HIP ARTHROPLASTY ANTERIOR APPROACH;  Surgeon: Moan Dempsey, MD;  Location: [facility];  Service: Orthopedics;  Laterality: Left;   UPPER GASTROINTESTINAL ENDOSCOPY  Sept 2012   Family History  Problem Relation Age of Onset   Coronary artery disease Mother 55        CABG age 98   Hyperlipidemia Mother    Stroke Mother    Alcohol abuse Father    Hypertension Father    Hypertension Sister    Hypertension Brother    Arthritis Brother    Anesthesia problems Neg Hx    Hypotension  Neg Hx    Malignant hyperthermia Neg Hx    Pseudochol deficiency Neg Hx     Current Outpatient Medications:    amLODipine  (NORVASC ) 10 MG tablet, Take 1 tablet (10 mg total) by mouth daily. For high blood pressure, Disp: 90 tablet, Rfl: 3   ARTIFICIAL TEAR OP, Place 1 drop into both eyes daily as needed (dry eyes)., Disp: , Rfl:    Calcium Carb-Cholecalciferol (CALCIUM HIGH POTENCY/VITAMIN D) 600-5 MG-MCG TABS, Take by mouth., Disp: , Rfl:    diphenhydrAMINE  (BENADRYL ) 25 MG tablet, Take 25 mg by mouth daily as needed for itching., Disp: , Rfl:    hydrochlorothiazide  (MICROZIDE ) 12.5 MG capsule, Take 1 capsule (12.5 mg total) by mouth daily. Place on file, Disp: 90 capsule, Rfl: 3  No Known Allergies   ROS: Review of Systems Pertinent items noted in HPI and remainder of comprehensive ROS otherwise negative.    Physical exam BP 131/79   Pulse 78   Temp 97.7 F (36.5 C)   Ht 5' 2 (1.575 m)   Wt 149 lb (67.6 kg)   SpO2 94%   BMI 27.25 kg/m  General  appearance: alert, cooperative, appears stated age, and no distress Head: Normocephalic, without obvious abnormality, atraumatic Eyes: negative findings: lids and lashes normal, conjunctivae and sclerae normal, corneas clear, pupils equal, round, reactive to light and accomodation, and wears glasses Ears: normal TM's and external ear canals both ears Nose: Nares normal. Septum midline. Mucosa normal. No drainage or sinus tenderness. Throat: lips, mucosa, and tongue normal; teeth and gums normal Neck: no adenopathy, no carotid bruit, supple, symmetrical, trachea midline, and thyroid  not enlarged, symmetric, no tenderness/mass/nodules Back: increased thoracic kyphosis.  Lungs: clear to auscultation bilaterally Heart: regular rate and rhythm, S1, S2 normal, no murmur, click, rub or gallop Abdomen: soft, non-tender; bowel sounds normal; no masses,  no organomegaly Extremities: venous stasis edema present R>L. Pulses: 2+ and symmetric Skin: some postinflammatory hyperpigmentation of arms. Senile purpura present Lymph nodes: Cervical, supraclavicular, and axillary nodes normal. Neurologic: Grossly normal     12/26/2023   10:46 AM 06/06/2017    9:15 AM  MMSE - Mini Mental State Exam  Orientation to time 5 5   Orientation to Place 4 5   Registration 3 3   Attention/ Calculation 5 5   Recall 2 2   Language- name 2 objects 2 2   Language- repeat 1 1  Language- follow 3 step command 3 3   Language- read & follow direction 1 1   Write a sentence 1 1   Copy design 1 1   Total score 28 29      Data saved with a previous flowsheet row definition      05/08/2023    8:35 AM 11/23/2022    9:03 AM 06/20/2022   10:51 AM  Depression screen PHQ 2/9  Decreased Interest 0 0 0  Down, Depressed, Hopeless 0 0 0  PHQ - 2 Score 0 0 0  Altered sleeping 0 0 0  Tired, decreased energy 0 0 0  Change in appetite 0 0 0  Feeling bad or failure about yourself  0 0 0  Trouble concentrating 0 0 0  Moving slowly  or fidgety/restless 0 0 0  Suicidal thoughts 0 0 0  PHQ-9 Score 0 0 0  Difficult doing work/chores Not difficult at all Not difficult at all Not difficult at all      12/26/2023   10:27 AM 05/08/2023  8:34 AM 11/23/2022    9:04 AM 06/20/2022   10:50 AM  GAD 7 : Generalized Anxiety Score  Nervous, Anxious, on Edge 2 0 0 0  Control/stop worrying 2 0 0 0  Worry too much - different things 2 0 0 0  Trouble relaxing  0 0 0  Restless  0 0 0  Easily annoyed or irritable  0 0 0  Afraid - awful might happen  0 0 0  Total GAD 7 Score  0 0 0  Anxiety Difficulty Somewhat difficult Not difficult at all Not difficult at all Not difficult at all     Assessment/ Plan: Ashley Holland here for annual physical exam.   White coat syndrome with diagnosis of hypertension - Plan: CMP14+EGFR  Pure hypercholesterolemia - Plan: CMP14+EGFR, Lipid Panel  Iron deficiency anemia secondary to inadequate dietary iron intake - Plan: CBC with Differential, Iron and TIBC([facility]/Sunquest), Ferritin, CANCELED: Iron, TIBC and Ferritin Panel, CANCELED: Iron, TIBC and Ferritin Panel  History of thyroid  disorder - Plan: TSH + free T4  Asymptomatic postmenopausal estrogen deficiency - Plan: DG [facility] DEXA  Memory changes  Assessment and Plan    Forgetfulness/ Memory changes Forgetfulness possibly exacerbated by stress. Memory testing planned to differentiate between stress-related memory issues and mild cognitive impairment. - MMSE today 28/30.  Essential hypertension w. edema/ venous stasis Blood pressure well-managed with hydrochlorothiazide . White coat syndrome noted, home readings normal. - Continue current hydrochlorothiazide  regimen. ok to use extra HCTZ if needed for edema but monitor BP closely. - check CBC/ iron studies given edema and h/o deficiency anemia   General Health Maintenance Discussed dietary habits. No major changes in vision or hearing. Recent mammogram normal except for calcium  deposits. - DEXA today - Flu deferred to Oct.     Counseled on healthy lifestyle choices, including diet (rich in fruits, vegetables and lean meats and low in salt and simple carbohydrates) and exercise (at least 30 minutes of moderate physical activity daily).  Patient to follow up 1 year for CPE  Ashley Freimuth M. Jolinda, DO

## 2023-12-27 DIAGNOSIS — M85832 Other specified disorders of bone density and structure, left forearm: Secondary | ICD-10-CM | POA: Diagnosis not present

## 2023-12-27 DIAGNOSIS — Z780 Asymptomatic menopausal state: Secondary | ICD-10-CM | POA: Diagnosis not present

## 2023-12-27 LAB — CMP14+EGFR
ALT: 12 IU/L (ref 0–32)
AST: 31 IU/L (ref 0–40)
Albumin: 4.5 g/dL (ref 3.7–4.7)
Alkaline Phosphatase: 104 IU/L (ref 44–121)
BUN/Creatinine Ratio: 20 (ref 12–28)
BUN: 15 mg/dL (ref 8–27)
Bilirubin Total: 0.4 mg/dL (ref 0.0–1.2)
CO2: 23 mmol/L (ref 20–29)
Calcium: 9.5 mg/dL (ref 8.7–10.3)
Chloride: 103 mmol/L (ref 96–106)
Creatinine, Ser: 0.75 mg/dL (ref 0.57–1.00)
Globulin, Total: 2.6 g/dL (ref 1.5–4.5)
Glucose: 87 mg/dL (ref 70–99)
Potassium: 4.2 mmol/L (ref 3.5–5.2)
Sodium: 141 mmol/L (ref 134–144)
Total Protein: 7.1 g/dL (ref 6.0–8.5)
eGFR: 78 mL/min/1.73 (ref >59)

## 2023-12-27 LAB — LIPID PANEL
Cholesterol, Total: 205 mg/dL — AB (ref 100–199)
HDL: 75 mg/dL (ref >39)
LDL CALC COMMENT:: 2.7 ratio (ref 0.0–4.4)
LDL Chol Calc (NIH): 120 mg/dL — AB (ref 0–99)
Triglycerides: 53 mg/dL (ref 0–149)
VLDL Cholesterol Cal: 10 mg/dL (ref 5–40)

## 2023-12-27 LAB — CBC WITH DIFFERENTIAL/PLATELET
Basophils Absolute: 0.1 x10E3/uL (ref 0.0–0.2)
Basos: 2 %
EOS (ABSOLUTE): 0.2 x10E3/uL (ref 0.0–0.4)
Eos: 3 %
Hematocrit: 42 % (ref 34.0–46.6)
Hemoglobin: 14.1 g/dL (ref 11.1–15.9)
Immature Grans (Abs): 0 x10E3/uL (ref 0.0–0.1)
Immature Granulocytes: 0 %
Lymphocytes Absolute: 1.4 x10E3/uL (ref 0.7–3.1)
Lymphs: 22 %
MCH: 30.5 pg (ref 26.6–33.0)
MCHC: 33.6 g/dL (ref 31.5–35.7)
MCV: 91 fL (ref 79–97)
Monocytes Absolute: 0.7 x10E3/uL (ref 0.1–0.9)
Monocytes: 11 %
Neutrophils Absolute: 4.2 x10E3/uL (ref 1.4–7.0)
Neutrophils: 62 %
Platelets: 320 x10E3/uL (ref 150–450)
RBC: 4.62 x10E6/uL (ref 3.77–5.28)
RDW: 12.4 % (ref 11.7–15.4)
WBC: 6.6 x10E3/uL (ref 3.4–10.8)

## 2023-12-27 LAB — TSH+FREE T4
Free T4: 1.11 ng/dL (ref 0.82–1.77)
TSH: 0.709 u[IU]/mL (ref 0.450–4.500)

## 2023-12-27 LAB — IRON AND TIBC
Iron Saturation: 28 (ref 15–55)
Iron: 83 ug/dL (ref 27–139)
Total Iron Binding Capacity: 292 ug/dL (ref 250–450)
UIBC: 209 ug/dL (ref 118–369)

## 2023-12-27 LAB — FERRITIN: Ferritin: 116 ng/mL (ref 15–150)

## 2023-12-30 ENCOUNTER — Ambulatory Visit: Payer: Self-pay | Admitting: Family Medicine

## 2024-03-30 ENCOUNTER — Telehealth: Payer: Self-pay

## 2024-03-30 ENCOUNTER — Encounter: Payer: Self-pay | Admitting: Family Medicine

## 2024-03-30 ENCOUNTER — Ambulatory Visit: Payer: Self-pay | Admitting: Family Medicine

## 2024-03-30 VITALS — BP 144/79 | HR 70 | Temp 98.0°F | Ht 63.0 in | Wt 154.4 lb

## 2024-03-30 DIAGNOSIS — R413 Other amnesia: Secondary | ICD-10-CM

## 2024-03-30 DIAGNOSIS — I10 Essential (primary) hypertension: Secondary | ICD-10-CM

## 2024-03-30 NOTE — Telephone Encounter (Signed)
 Called to see if patient could come in early due to inclement weather, patient coming in now.

## 2024-03-30 NOTE — Progress Notes (Signed)
 Subjective: CC: check up PCP: Ashley Norene HERO, DO YEP:Ashley Holland is a 83 y.o. female presenting to clinic today for:  She reports compliance with her medications.  She voices no concerns today.  Blood pressures have been relatively stable.  She reports no chest pain, shortness breath, visual disturbance.  She reports memory seems to be better and she agrees that this is probably due to some increased dressers.  Since her last visit her daughter, granddaughter and great granddaughter have moved in with her.  She has noticed a palpable improvement in loneliness but it has been a little bit of an adjustment period.  She also has 2 cats that now live with her.  She is getting her head cold but reports feeling well   ROS: Per HPI  No Known Allergies Past Medical History:  Diagnosis Date   Arthritis    Per medical history form dated 11/04/09.past history DJD back   Autoimmune thyroiditis 02/21/2014   Enlarged thyroid     was told had nodules - no swallowing problems   Female bladder prolapse    GERD (gastroesophageal reflux disease) 05/19/2013   Malignant neoplasm of breast (female), unspecified site    right breast cancer. surgery, radiation, oral meds- Arimidex .   Malignant neoplasm of right breast (HCC) 12/10/2011   IDC; T1c,No;  ER+; Her-2 neg.;  Ki-67 18%  11/16/09 - Right PM/SLN  Completed XRT  Adjuvant arimidex      Menopause 09/22/2010   Per medical history form dated 11/04/09.     Nontoxic multinodular goiter 11/10/2015   Other and unspecified hyperlipidemia    Rectal prolapse    stool softeners helpful.   Unspecified essential hypertension     Current Outpatient Medications:    amLODipine  (NORVASC ) 10 MG tablet, Take 1 tablet (10 mg total) by mouth daily. For high blood pressure, Disp: 90 tablet, Rfl: 3   ARTIFICIAL TEAR OP, Place 1 drop into both eyes daily as needed (dry eyes)., Disp: , Rfl:    Calcium Carb-Cholecalciferol (CALCIUM HIGH POTENCY/VITAMIN D) 600-5  MG-MCG TABS, Take by mouth., Disp: , Rfl:    diphenhydrAMINE  (BENADRYL ) 25 MG tablet, Take 25 mg by mouth daily as needed for itching., Disp: , Rfl:    hydrochlorothiazide  (MICROZIDE ) 12.5 MG capsule, Take 1 capsule (12.5 mg total) by mouth daily. Place on file, Disp: 90 capsule, Rfl: 3 Social History   Socioeconomic History   Marital status: Widowed    Spouse name: Ashley Holland   Number of children: 2   Years of education: Not on file   Highest education level: 12th grade  Occupational History   Occupation: Retired    Comment: Futures trader  Tobacco Use   Smoking status: Never   Smokeless tobacco: Never  Vaping Use   Vaping status: Never Used  Substance and Sexual Activity   Alcohol use: No   Drug use: No   Sexual activity: Not Currently  Other Topics Concern   Not on file  Social History Narrative   One level home with her husband who has dementia -she is his caregiver   Social Drivers of Corporate Investment Banker Strain: Low Risk  (03/27/2024)   Overall Financial Resource Strain (CARDIA)    Difficulty of Paying Living Expenses: Not very hard  Food Insecurity: No Food Insecurity (03/27/2024)   Hunger Vital Sign    Worried About Running Out of Food in the Last Year: Never true    Ran Out of Food in the Last Year: Never  true  Transportation Needs: No Transportation Needs (03/27/2024)   PRAPARE - Administrator, Civil Service (Medical): No    Lack of Transportation (Non-Medical): No  Physical Activity: Insufficiently Active (03/27/2024)   Exercise Vital Sign    Days of Exercise per Week: 7 days    Minutes of Exercise per Session: 20 min  Stress: No Stress Concern Present (03/27/2024)   [employer] of Occupational Health - Occupational Stress Questionnaire    Feeling of Stress: Not at all  Social Connections: Moderately Integrated (03/27/2024)   Social Connection and Isolation Panel    Frequency of Communication with Friends and Family: More than three  times a week    Frequency of Social Gatherings with Friends and Family: Three times a week    Attends Religious Services: More than 4 times per year    Active Member of Clubs or Organizations: Yes    Attends Banker Meetings: More than 4 times per year    Marital Status: Widowed  Intimate Partner Violence: Unknown (07/25/2021)   Received from [facility]   HITS    Physically Hurt: Not on file    Insult or Talk Down To: Not on file    Threaten Physical Harm: Not on file    Scream or Curse: Not on file   Family History  Problem Relation Age of Onset   Coronary artery disease Mother 68        CABG age 85   Hyperlipidemia Mother    Stroke Mother    Alcohol abuse Father    Hypertension Father    Hypertension Sister    Hypertension Brother    Arthritis Brother    Anesthesia problems Neg Hx    Hypotension Neg Hx    Malignant hyperthermia Neg Hx    Pseudochol deficiency Neg Hx     Objective: Office vital signs reviewed. BP (!) 144/79   Pulse 70   Temp 98 F (36.7 C)   Ht 5' 3 (1.6 m)   Wt 154 lb 6 oz (70 kg)   SpO2 97%   BMI 27.35 kg/m   Physical Examination:  General: Awake, alert, well nourished, No acute distress HEENT: Normal    Neck: No masses palpated. No lymphadenopathy    Ears: Tympanic membranes intact, normal light reflex, no erythema, no bulging    Eyes: PERRLA, extraocular membranes intact, sclera white    Nose: nasal turbinates moist, clear nasal discharge    Throat: moist mucus membranes, no erythema, no tonsillar exudate.  Airway is patent Cardio: regular rate and rhythm, S1S2 heard, no murmurs appreciated Pulm: clear to auscultation bilaterally, no wheezes, rhonchi or rales; normal work of breathing on room air     03/30/2024   12:19 PM 05/08/2023    8:35 AM 11/23/2022    9:03 AM  Depression screen PHQ 2/9  Decreased Interest 0 0 0  Down, Depressed, Hopeless 0 0 0  PHQ - 2 Score 0 0 0  Altered sleeping 0 0 0  Tired, decreased energy 0  0 0  Change in appetite 0 0 0  Feeling bad or failure about yourself  0 0 0  Trouble concentrating 0 0 0  Moving slowly or fidgety/restless 0 0 0  Suicidal thoughts 0 0 0  PHQ-9 Score 0 0  0   Difficult doing work/chores Not difficult at all Not difficult at all Not difficult at all     Data saved with a previous flowsheet row definition  03/30/2024   12:19 PM 12/26/2023   10:27 AM 05/08/2023    8:34 AM 11/23/2022    9:04 AM  GAD 7 : Generalized Anxiety Score  Nervous, Anxious, on Edge 0 2 0 0  Control/stop worrying 0 2 0 0  Worry too much - different things 0 2 0 0  Trouble relaxing 0  0 0  Restless 0  0 0  Easily annoyed or irritable 0  0 0  Afraid - awful might happen 0  0 0  Total GAD 7 Score 0  0 0  Anxiety Difficulty Not difficult at all Somewhat difficult Not difficult at all Not difficult at all   Assessment/ Plan: 83 y.o. female   White coat syndrome with diagnosis of hypertension  Memory changes   Blood pressure is controlled for age.  No changes.  Memory changes have been stable and seem to be improved now that mental health has stabilized.  She voiced no concerns today so may follow-up in 4 to 6 months, sooner if concerns arise   Kindal Ponti CHRISTELLA Fielding, DO [facility] [phone]

## 2024-08-11 ENCOUNTER — Ambulatory Visit: Admitting: Family Medicine

## 2024-12-30 ENCOUNTER — Encounter: Payer: Self-pay | Admitting: Family Medicine
